# Patient Record
Sex: Female | Born: 1975
Health system: Southern US, Community
[De-identification: ages and names within clinical notes are randomized; demographics above are authoritative.]

## PROBLEM LIST (undated history)

## (undated) DIAGNOSIS — IMO0002 Reserved for concepts with insufficient information to code with codable children: Secondary | ICD-10-CM

## (undated) DIAGNOSIS — M797 Fibromyalgia: Secondary | ICD-10-CM

## (undated) DIAGNOSIS — T8859XA Other complications of anesthesia, initial encounter: Secondary | ICD-10-CM

## (undated) DIAGNOSIS — M549 Dorsalgia, unspecified: Secondary | ICD-10-CM

## (undated) DIAGNOSIS — F4310 Post-traumatic stress disorder, unspecified: Secondary | ICD-10-CM

## (undated) DIAGNOSIS — T4145XA Adverse effect of unspecified anesthetic, initial encounter: Secondary | ICD-10-CM

## (undated) DIAGNOSIS — H7290 Unspecified perforation of tympanic membrane, unspecified ear: Secondary | ICD-10-CM

## (undated) DIAGNOSIS — G2581 Restless legs syndrome: Secondary | ICD-10-CM

## (undated) DIAGNOSIS — G43909 Migraine, unspecified, not intractable, without status migrainosus: Secondary | ICD-10-CM

## (undated) DIAGNOSIS — M1990 Unspecified osteoarthritis, unspecified site: Secondary | ICD-10-CM

## (undated) DIAGNOSIS — K219 Gastro-esophageal reflux disease without esophagitis: Secondary | ICD-10-CM

## (undated) DIAGNOSIS — F419 Anxiety disorder, unspecified: Secondary | ICD-10-CM

## (undated) DIAGNOSIS — F32A Depression, unspecified: Secondary | ICD-10-CM

## (undated) HISTORY — DX: Anxiety disorder, unspecified: F41.9

## (undated) HISTORY — PX: TONSILLECTOMY: SUR1361

## (undated) HISTORY — DX: Reserved for concepts with insufficient information to code with codable children: IMO0002

## (undated) HISTORY — PX: TUBAL LIGATION: SHX77

## (undated) HISTORY — DX: Post-traumatic stress disorder, unspecified: F43.10

## (undated) HISTORY — DX: Depression, unspecified: F32.A

## (undated) HISTORY — DX: Unspecified osteoarthritis, unspecified site: M19.90

---

## 1997-08-10 ENCOUNTER — Ambulatory Visit (HOSPITAL_COMMUNITY): Admission: RE | Admit: 1997-08-10 | Discharge: 1997-08-10 | Payer: Self-pay | Admitting: Dermatology

## 1997-08-26 ENCOUNTER — Inpatient Hospital Stay (HOSPITAL_COMMUNITY): Admission: AD | Admit: 1997-08-26 | Discharge: 1997-08-26 | Payer: Self-pay | Admitting: Obstetrics

## 1997-09-30 ENCOUNTER — Inpatient Hospital Stay (HOSPITAL_COMMUNITY): Admission: AD | Admit: 1997-09-30 | Discharge: 1997-09-30 | Payer: Self-pay | Admitting: Obstetrics

## 1997-10-04 ENCOUNTER — Inpatient Hospital Stay (HOSPITAL_COMMUNITY): Admission: AD | Admit: 1997-10-04 | Discharge: 1997-10-06 | Payer: Self-pay | Admitting: Obstetrics

## 1998-03-05 ENCOUNTER — Inpatient Hospital Stay (HOSPITAL_COMMUNITY): Admission: AD | Admit: 1998-03-05 | Discharge: 1998-03-05 | Payer: Self-pay | Admitting: Obstetrics

## 1998-04-15 ENCOUNTER — Emergency Department (HOSPITAL_COMMUNITY): Admission: EM | Admit: 1998-04-15 | Discharge: 1998-04-15 | Payer: Self-pay | Admitting: Emergency Medicine

## 1999-08-11 ENCOUNTER — Emergency Department (HOSPITAL_COMMUNITY): Admission: EM | Admit: 1999-08-11 | Discharge: 1999-08-11 | Payer: Self-pay | Admitting: Emergency Medicine

## 1999-08-12 ENCOUNTER — Emergency Department (HOSPITAL_COMMUNITY): Admission: EM | Admit: 1999-08-12 | Discharge: 1999-08-12 | Payer: Self-pay

## 1999-08-21 ENCOUNTER — Encounter: Payer: Self-pay | Admitting: Emergency Medicine

## 1999-08-21 ENCOUNTER — Emergency Department (HOSPITAL_COMMUNITY): Admission: EM | Admit: 1999-08-21 | Discharge: 1999-08-21 | Payer: Self-pay | Admitting: Emergency Medicine

## 2000-01-24 ENCOUNTER — Emergency Department (HOSPITAL_COMMUNITY): Admission: EM | Admit: 2000-01-24 | Discharge: 2000-01-24 | Payer: Self-pay | Admitting: Emergency Medicine

## 2001-07-13 ENCOUNTER — Emergency Department (HOSPITAL_COMMUNITY): Admission: EM | Admit: 2001-07-13 | Discharge: 2001-07-13 | Payer: Self-pay | Admitting: Emergency Medicine

## 2001-10-31 ENCOUNTER — Emergency Department (HOSPITAL_COMMUNITY): Admission: EM | Admit: 2001-10-31 | Discharge: 2001-10-31 | Payer: Self-pay | Admitting: Emergency Medicine

## 2001-11-01 ENCOUNTER — Emergency Department (HOSPITAL_COMMUNITY): Admission: EM | Admit: 2001-11-01 | Discharge: 2001-11-01 | Payer: Self-pay | Admitting: Emergency Medicine

## 2003-04-09 ENCOUNTER — Emergency Department (HOSPITAL_COMMUNITY): Admission: EM | Admit: 2003-04-09 | Discharge: 2003-04-09 | Payer: Self-pay | Admitting: Emergency Medicine

## 2003-06-27 ENCOUNTER — Emergency Department (HOSPITAL_COMMUNITY): Admission: EM | Admit: 2003-06-27 | Discharge: 2003-06-27 | Payer: Self-pay | Admitting: Emergency Medicine

## 2004-02-24 ENCOUNTER — Emergency Department (HOSPITAL_COMMUNITY): Admission: EM | Admit: 2004-02-24 | Discharge: 2004-02-24 | Payer: Self-pay | Admitting: Emergency Medicine

## 2004-11-29 ENCOUNTER — Emergency Department (HOSPITAL_COMMUNITY): Admission: EM | Admit: 2004-11-29 | Discharge: 2004-11-29 | Payer: Self-pay | Admitting: Emergency Medicine

## 2005-08-21 ENCOUNTER — Emergency Department (HOSPITAL_COMMUNITY): Admission: EM | Admit: 2005-08-21 | Discharge: 2005-08-21 | Payer: Self-pay | Admitting: Emergency Medicine

## 2006-02-04 ENCOUNTER — Encounter: Payer: Self-pay | Admitting: Emergency Medicine

## 2006-02-05 ENCOUNTER — Inpatient Hospital Stay (HOSPITAL_COMMUNITY): Admission: RE | Admit: 2006-02-05 | Discharge: 2006-02-06 | Payer: Self-pay | Admitting: Orthopaedic Surgery

## 2006-02-23 ENCOUNTER — Inpatient Hospital Stay (HOSPITAL_COMMUNITY): Admission: AD | Admit: 2006-02-23 | Discharge: 2006-02-23 | Payer: Self-pay | Admitting: Obstetrics and Gynecology

## 2006-09-02 ENCOUNTER — Inpatient Hospital Stay (HOSPITAL_COMMUNITY): Admission: AD | Admit: 2006-09-02 | Discharge: 2006-09-02 | Payer: Self-pay | Admitting: Obstetrics & Gynecology

## 2006-11-01 ENCOUNTER — Emergency Department (HOSPITAL_COMMUNITY): Admission: EM | Admit: 2006-11-01 | Discharge: 2006-11-01 | Payer: Self-pay | Admitting: Emergency Medicine

## 2007-03-19 ENCOUNTER — Emergency Department (HOSPITAL_COMMUNITY): Admission: EM | Admit: 2007-03-19 | Discharge: 2007-03-19 | Payer: Self-pay | Admitting: Emergency Medicine

## 2007-08-09 ENCOUNTER — Emergency Department (HOSPITAL_COMMUNITY): Admission: EM | Admit: 2007-08-09 | Discharge: 2007-08-10 | Payer: Self-pay | Admitting: Emergency Medicine

## 2007-10-17 ENCOUNTER — Emergency Department (HOSPITAL_COMMUNITY): Admission: EM | Admit: 2007-10-17 | Discharge: 2007-10-17 | Payer: Self-pay | Admitting: Emergency Medicine

## 2008-01-05 ENCOUNTER — Emergency Department (HOSPITAL_COMMUNITY): Admission: EM | Admit: 2008-01-05 | Discharge: 2008-01-06 | Payer: Self-pay | Admitting: Emergency Medicine

## 2008-01-15 ENCOUNTER — Emergency Department (HOSPITAL_COMMUNITY): Admission: EM | Admit: 2008-01-15 | Discharge: 2008-01-15 | Payer: Self-pay | Admitting: Emergency Medicine

## 2008-02-15 ENCOUNTER — Emergency Department (HOSPITAL_COMMUNITY): Admission: EM | Admit: 2008-02-15 | Discharge: 2008-02-15 | Payer: Self-pay | Admitting: Emergency Medicine

## 2008-03-06 ENCOUNTER — Emergency Department (HOSPITAL_COMMUNITY): Admission: EM | Admit: 2008-03-06 | Discharge: 2008-03-06 | Payer: Self-pay | Admitting: Emergency Medicine

## 2008-03-21 ENCOUNTER — Emergency Department (HOSPITAL_COMMUNITY): Admission: EM | Admit: 2008-03-21 | Discharge: 2008-03-21 | Payer: Self-pay | Admitting: Emergency Medicine

## 2008-03-23 ENCOUNTER — Emergency Department (HOSPITAL_COMMUNITY): Admission: EM | Admit: 2008-03-23 | Discharge: 2008-03-23 | Payer: Self-pay | Admitting: Emergency Medicine

## 2008-04-28 ENCOUNTER — Emergency Department (HOSPITAL_COMMUNITY): Admission: EM | Admit: 2008-04-28 | Discharge: 2008-04-28 | Payer: Self-pay | Admitting: Emergency Medicine

## 2008-09-04 ENCOUNTER — Inpatient Hospital Stay (HOSPITAL_COMMUNITY): Admission: AD | Admit: 2008-09-04 | Discharge: 2008-09-04 | Payer: Self-pay | Admitting: Family Medicine

## 2008-10-25 ENCOUNTER — Emergency Department (HOSPITAL_COMMUNITY): Admission: EM | Admit: 2008-10-25 | Discharge: 2008-10-25 | Payer: Self-pay | Admitting: Emergency Medicine

## 2008-12-04 ENCOUNTER — Emergency Department (HOSPITAL_COMMUNITY): Admission: EM | Admit: 2008-12-04 | Discharge: 2008-12-04 | Payer: Self-pay | Admitting: Emergency Medicine

## 2009-01-21 ENCOUNTER — Emergency Department (HOSPITAL_COMMUNITY): Admission: EM | Admit: 2009-01-21 | Discharge: 2009-01-21 | Payer: Self-pay | Admitting: Emergency Medicine

## 2009-03-09 ENCOUNTER — Emergency Department (HOSPITAL_COMMUNITY): Admission: EM | Admit: 2009-03-09 | Discharge: 2009-03-09 | Payer: Self-pay | Admitting: Emergency Medicine

## 2009-05-07 ENCOUNTER — Emergency Department (HOSPITAL_COMMUNITY): Admission: EM | Admit: 2009-05-07 | Discharge: 2009-05-07 | Payer: Self-pay | Admitting: Emergency Medicine

## 2009-05-12 ENCOUNTER — Emergency Department (HOSPITAL_COMMUNITY): Admission: EM | Admit: 2009-05-12 | Discharge: 2009-05-12 | Payer: Self-pay | Admitting: Emergency Medicine

## 2009-06-23 ENCOUNTER — Emergency Department (HOSPITAL_COMMUNITY): Admission: EM | Admit: 2009-06-23 | Discharge: 2009-06-23 | Payer: Self-pay | Admitting: Emergency Medicine

## 2009-12-04 ENCOUNTER — Emergency Department (HOSPITAL_COMMUNITY): Admission: EM | Admit: 2009-12-04 | Discharge: 2009-12-04 | Payer: Self-pay | Admitting: Emergency Medicine

## 2009-12-08 ENCOUNTER — Emergency Department (HOSPITAL_COMMUNITY): Admission: EM | Admit: 2009-12-08 | Discharge: 2009-12-08 | Payer: Self-pay | Admitting: Emergency Medicine

## 2010-01-31 ENCOUNTER — Emergency Department (HOSPITAL_COMMUNITY): Admission: EM | Admit: 2010-01-31 | Discharge: 2010-01-31 | Payer: Self-pay | Admitting: Emergency Medicine

## 2010-02-04 ENCOUNTER — Ambulatory Visit: Payer: Self-pay | Admitting: Nurse Practitioner

## 2010-02-04 ENCOUNTER — Inpatient Hospital Stay (HOSPITAL_COMMUNITY): Admission: AD | Admit: 2010-02-04 | Discharge: 2010-02-04 | Payer: Self-pay | Admitting: Family Medicine

## 2010-02-18 ENCOUNTER — Emergency Department (HOSPITAL_COMMUNITY): Admission: EM | Admit: 2010-02-18 | Discharge: 2010-02-18 | Payer: Self-pay | Admitting: Emergency Medicine

## 2010-04-11 ENCOUNTER — Emergency Department (HOSPITAL_COMMUNITY): Admission: EM | Admit: 2010-04-11 | Discharge: 2010-04-11 | Payer: Self-pay | Admitting: Emergency Medicine

## 2010-09-04 LAB — POCT I-STAT, CHEM 8
BUN: 14 mg/dL (ref 6–23)
Calcium, Ion: 1.21 mmol/L (ref 1.12–1.32)
HCT: 43 % (ref 36.0–46.0)
Potassium: 4.2 mEq/L (ref 3.5–5.1)
Sodium: 138 mEq/L (ref 135–145)
TCO2: 24 mmol/L (ref 0–100)

## 2010-09-04 LAB — DIFFERENTIAL
Eosinophils Absolute: 0.1 10*3/uL (ref 0.0–0.7)
Eosinophils Relative: 1 % (ref 0–5)
Neutro Abs: 6.5 10*3/uL (ref 1.7–7.7)
Neutrophils Relative %: 57 % (ref 43–77)

## 2010-09-04 LAB — CBC
MCH: 31 pg (ref 26.0–34.0)
Platelets: 347 10*3/uL (ref 150–400)
RDW: 13.1 % (ref 11.5–15.5)
WBC: 11.3 10*3/uL — ABNORMAL HIGH (ref 4.0–10.5)

## 2010-09-04 LAB — POCT CARDIAC MARKERS
CKMB, poc: <1 ng/mL — ABNORMAL LOW (ref 1.0–8.0)
Troponin i, poc: <0.05 ng/mL (ref 0.00–0.09)

## 2010-09-06 LAB — URINALYSIS, ROUTINE W REFLEX MICROSCOPIC
Bilirubin Urine: NEGATIVE
Bilirubin Urine: NEGATIVE
Glucose, UA: NEGATIVE mg/dL
Hgb urine dipstick: NEGATIVE
Ketones, ur: NEGATIVE mg/dL
Nitrite: NEGATIVE
Nitrite: NEGATIVE
Protein, ur: NEGATIVE mg/dL
Urobilinogen, UA: 0.2 mg/dL (ref 0.0–1.0)
Urobilinogen, UA: 0.2 mg/dL (ref 0.0–1.0)
Urobilinogen, UA: 0.2 mg/dL (ref 0.0–1.0)
pH: 5 (ref 5.0–8.0)
pH: 5.5 (ref 5.0–8.0)
pH: 6 (ref 5.0–8.0)

## 2010-09-06 LAB — POCT PREGNANCY, URINE
Preg Test, Ur: NEGATIVE
Preg Test, Ur: NEGATIVE

## 2010-09-06 LAB — GC/CHLAMYDIA PROBE AMP, GENITAL
Chlamydia, DNA Probe: NEGATIVE
GC Probe Amp, Genital: NEGATIVE

## 2010-09-06 LAB — WET PREP, GENITAL
Trich, Wet Prep: NONE SEEN
Yeast Wet Prep HPF POC: NONE SEEN

## 2010-09-06 LAB — URINE MICROSCOPIC-ADD ON

## 2010-09-08 LAB — URINALYSIS, ROUTINE W REFLEX MICROSCOPIC
Bilirubin Urine: NEGATIVE
Protein, ur: NEGATIVE mg/dL
Specific Gravity, Urine: 1.026 (ref 1.005–1.030)
pH: 6 (ref 5.0–8.0)

## 2010-09-08 LAB — URINE MICROSCOPIC-ADD ON

## 2010-09-28 LAB — URINALYSIS, ROUTINE W REFLEX MICROSCOPIC
Bilirubin Urine: NEGATIVE
Hgb urine dipstick: NEGATIVE
Specific Gravity, Urine: 1.02 (ref 1.005–1.030)
pH: 5.5 (ref 5.0–8.0)

## 2010-10-03 LAB — URINALYSIS, ROUTINE W REFLEX MICROSCOPIC
Glucose, UA: NEGATIVE mg/dL
Leukocytes, UA: NEGATIVE
pH: 6 (ref 5.0–8.0)

## 2010-10-03 LAB — URINE MICROSCOPIC-ADD ON

## 2010-10-03 LAB — POCT PREGNANCY, URINE: Preg Test, Ur: NEGATIVE
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
## 2011-03-14 LAB — I-STAT 8, (EC8 V) (CONVERTED LAB)
BUN: 7
Bicarbonate: 24.7 — ABNORMAL HIGH
Glucose, Bld: 97
Sodium: 140
pCO2, Ven: 37.6 — ABNORMAL LOW

## 2011-03-14 LAB — POCT CARDIAC MARKERS
CKMB, poc: 1.2
Myoglobin, poc: 63.5
Troponin i, poc: <0.05

## 2011-03-18 LAB — POCT PREGNANCY, URINE
Operator id: 294591
Preg Test, Ur: NEGATIVE

## 2011-03-26 LAB — POCT URINALYSIS DIP (DEVICE)
Hgb urine dipstick: NEGATIVE
Ketones, ur: NEGATIVE
Protein, ur: NEGATIVE
Specific Gravity, Urine: 1.015
pH: 8.5 — ABNORMAL HIGH

## 2011-03-26 LAB — POCT PREGNANCY, URINE: Preg Test, Ur: NEGATIVE

## 2012-02-11 ENCOUNTER — Emergency Department (HOSPITAL_COMMUNITY)
Admission: EM | Admit: 2012-02-11 | Discharge: 2012-02-12 | Disposition: A | Discharge status: Home / Self Care | Payer: Self-pay | Attending: Emergency Medicine | Admitting: Emergency Medicine

## 2012-02-11 ENCOUNTER — Emergency Department (HOSPITAL_COMMUNITY): Payer: Self-pay

## 2012-02-11 DIAGNOSIS — H53149 Visual discomfort, unspecified: Secondary | ICD-10-CM | POA: Insufficient documentation

## 2012-02-11 DIAGNOSIS — G43909 Migraine, unspecified, not intractable, without status migrainosus: Secondary | ICD-10-CM | POA: Insufficient documentation

## 2012-02-11 DIAGNOSIS — R110 Nausea: Secondary | ICD-10-CM | POA: Insufficient documentation

## 2012-02-11 DIAGNOSIS — R079 Chest pain, unspecified: Secondary | ICD-10-CM | POA: Insufficient documentation

## 2012-02-11 HISTORY — DX: Migraine, unspecified, not intractable, without status migrainosus: G43.909

## 2012-02-11 MED ORDER — SODIUM CHLORIDE 0.9 % IV SOLN
Freq: Once | INTRAVENOUS | Status: AC
  Administered 2012-02-12: 01:00:00 via INTRAVENOUS

## 2012-02-11 NOTE — ED Notes (Signed)
EKG given to EDP, Wentz, MD. 

## 2012-02-11 NOTE — ED Notes (Signed)
Brought in by EMS from home with c/o migraine headache and chest pain.  Per EMS, pt reported that she has been having on and off headache  And "tightening in the chest" for the past several days but tonight, she had "sharp chest pain" while walking to a store.  Pt's chest pain resolved upon EMS arrival.  Presents to room with c/o severe headache, states chest pain "is still a little tight but my head hurts the most".

## 2012-02-11 NOTE — ED Notes (Signed)
ZOX:WR60<AV> Expected date:02/11/12<BR> Expected time:11:15 PM<BR> Means of arrival:Ambulance<BR> Comments:<BR> migraine

## 2012-02-12 ENCOUNTER — Encounter (HOSPITAL_COMMUNITY): Payer: Self-pay | Admitting: Emergency Medicine

## 2012-02-12 LAB — TROPONIN I: Troponin I: <0.3 ng/mL (ref <0.30)

## 2012-02-12 LAB — URINALYSIS, ROUTINE W REFLEX MICROSCOPIC
Glucose, UA: NEGATIVE mg/dL
Ketones, ur: NEGATIVE mg/dL
Nitrite: NEGATIVE
Protein, ur: NEGATIVE mg/dL
pH: 6 (ref 5.0–8.0)

## 2012-02-12 LAB — COMPREHENSIVE METABOLIC PANEL
AST: 20 U/L (ref 0–37)
Albumin: 4.5 g/dL (ref 3.5–5.2)
BUN: 12 mg/dL (ref 6–23)
Calcium: 9.7 mg/dL (ref 8.4–10.5)
Creatinine, Ser: 0.61 mg/dL (ref 0.50–1.10)

## 2012-02-12 LAB — CBC WITH DIFFERENTIAL/PLATELET
Basophils Absolute: 0 10*3/uL (ref 0.0–0.1)
Basophils Relative: 0 % (ref 0–1)
Eosinophils Absolute: 0.2 10*3/uL (ref 0.0–0.7)
Eosinophils Relative: 1 % (ref 0–5)
HCT: 41 % (ref 36.0–46.0)
MCH: 30.9 pg (ref 26.0–34.0)
MCHC: 35.4 g/dL (ref 30.0–36.0)
MCV: 87.4 fL (ref 78.0–100.0)
Monocytes Absolute: 0.9 10*3/uL (ref 0.1–1.0)
Monocytes Relative: 5 % (ref 3–12)
RDW: 13 % (ref 11.5–15.5)

## 2012-02-12 LAB — URINE MICROSCOPIC-ADD ON

## 2012-02-12 LAB — PREGNANCY, URINE: Preg Test, Ur: NEGATIVE

## 2012-02-12 MED ORDER — SODIUM CHLORIDE 0.9 % IV BOLUS (SEPSIS)
1000.0000 mL | Freq: Once | INTRAVENOUS | Status: AC
  Administered 2012-02-12: 1000 mL via INTRAVENOUS

## 2012-02-12 MED ORDER — METOCLOPRAMIDE HCL 5 MG/ML IJ SOLN
10.0000 mg | Freq: Once | INTRAMUSCULAR | Status: AC
  Administered 2012-02-12: 10 mg via INTRAVENOUS
  Filled 2012-02-12: qty 2

## 2012-02-12 MED ORDER — GI COCKTAIL ~~LOC~~
30.0000 mL | Freq: Once | ORAL | Status: AC
  Administered 2012-02-12: 30 mL via ORAL
  Filled 2012-02-12: qty 30

## 2012-02-12 MED ORDER — IBUPROFEN 600 MG PO TABS: 600.0000 mg | ORAL_TABLET | Freq: Four times a day (QID) | ORAL | Status: AC | PRN

## 2012-02-12 MED ORDER — HYDROCODONE-ACETAMINOPHEN 5-325 MG PO TABS: 2.0000 | ORAL_TABLET | Freq: Four times a day (QID) | ORAL | Status: AC | PRN

## 2012-02-12 MED ORDER — KETOROLAC TROMETHAMINE 30 MG/ML IJ SOLN
30.0000 mg | Freq: Once | INTRAMUSCULAR | Status: AC
  Administered 2012-02-12: 30 mg via INTRAVENOUS
  Filled 2012-02-12: qty 1

## 2012-02-12 NOTE — ED Provider Notes (Addendum)
History     CSN: [id number]  Arrival date & time 02/11/12  2319   First MD Initiated Contact with Patient 02/12/12 0011      Chief Complaint  Patient presents with  . Migraine  . Chest Pain    (Consider location/radiation/quality/duration/timing/severity/associated sxs/prior treatment) HPI Comments: Pt comes in with primary complain of migraines. Pt has hx of migraines, and her pain is similar this time, located in the frontal area and occipital area, with associated photophobia and nausea. The pain is throbbing. It typically responds to ice and Advil, hasn't for the past 3 days, thus she comes in for further evaluation. Pt has No seizures, altered mental status, loss of consciousness, new weakness, or numbness, no gait instability.  Pt also complains of some chest discomfort, on the left side for the past several days. The pain is provoked by movement of the upper extremities, specifically raising the arms. No cardiac hx, no illicit use, no hx of PE, DVT or risk factors for the same. The pain is not pleuritic or exertional.   The history is provided by the patient.    No past medical history on file.  No past surgical history on file.  No family history on file.  History  Substance Use Topics  . Smoking status: Not on file  . Smokeless tobacco: Not on file  . Alcohol Use: Not on file    OB History    No data available      Review of Systems  Constitutional: Positive for activity change.  HENT: Negative for neck pain.   Respiratory: Negative for shortness of breath, wheezing and stridor.   Cardiovascular: Positive for chest pain. Negative for palpitations and leg swelling.  Gastrointestinal: Positive for nausea. Negative for vomiting and abdominal pain.  Genitourinary: Negative for dysuria.  Neurological: Positive for headaches. Negative for syncope and weakness.    Allergies  Review of patient's allergies indicates no known allergies.  Home Medications    Current Outpatient Rx  Name Route Sig Dispense Refill  . ASPIRIN EC 325 MG PO TBEC Oral Take 325 mg by mouth daily.    Marland Kitchen NAPROXEN SODIUM 220 MG PO TABS Oral Take 220 mg by mouth 2 (two) times daily with a meal.    . VITAMIN B-12 1000 MCG PO TABS Oral Take 1,000 mcg by mouth daily.      BP 111/93  Pulse 74  Temp 97.9 F (36.6 C) (Oral)  Resp 16  SpO2 96%  LMP 01/23/2012  Physical Exam  Nursing note and vitals reviewed. Constitutional: She is oriented to person, place, and time. She appears well-developed and well-nourished.  HENT:  Head: Normocephalic and atraumatic.  Eyes: EOM are normal. Pupils are equal, round, and reactive to light.  Neck: Neck supple.  Cardiovascular: Normal rate, regular rhythm and normal heart sounds.   No murmur heard. Pulmonary/Chest: Effort normal. No respiratory distress. She exhibits tenderness.       Reproducible chest wall tenderness on the left side with movement  Abdominal: Soft. She exhibits no distension. There is no tenderness. There is no rebound and no guarding.  Neurological: She is alert and oriented to person, place, and time. No cranial nerve deficit. Coordination normal.  Skin: Skin is warm and dry.    ED Course  Procedures (including critical care time)  Labs Reviewed  URINALYSIS, ROUTINE W REFLEX MICROSCOPIC - Abnormal; Notable for the following:    APPearance CLOUDY (*)     Leukocytes, UA SMALL (*)  All other components within normal limits  URINE MICROSCOPIC-ADD ON - Abnormal; Notable for the following:    Squamous Epithelial / LPF FEW (*)     Bacteria, UA FEW (*)     All other components within normal limits  PREGNANCY, URINE  CBC WITH DIFFERENTIAL  COMPREHENSIVE METABOLIC PANEL  TROPONIN I   Dg Chest 2 View  02/12/2012  *RADIOLOGY REPORT*  Clinical Data: Chest pain.  CHEST - 2 VIEW  Comparison: 04/11/2010  Findings: The heart size and pulmonary vascularity are normal. The lungs appear clear and expanded without  focal air space disease or consolidation. No blunting of the costophrenic angles.  No pneumothorax.  Mediastinal contours appear intact.  No significant change since previous study.  IMPRESSION: No evidence of active pulmonary disease.   Original Report Authenticated By: Marlon Pel, M.D.      No diagnosis found.    MDM   Date: 02/12/2012  Rate: 74  Rhythm: normal sinus rhythm  QRS Axis: normal  Intervals: normal  ST/T Wave abnormalities: normal  Conduction Disutrbances:none  Narrative Interpretation:   Old EKG Reviewed: none available    DDX includes: Primary headaches - including migrainous headaches, cluster headaches, tension headaches. ICH Carotid dissection Cavernous sinus thrombosis Meningitis Encephalitis Sinusitis Tumor Vascular headaches AV malformation Brain aneurysm Muscular headaches ACS syndrome Dyspepsia Pneumonia PE  A/P: Pt comes in with cc of headaches. No concerns for life threatening secondary headaches because the headches are typical of her migraine headaches. Will give pain meds, for now. The chest pain is not new, and is musculoskeletal in nature on our exam. trop x 1, ekg and CXR is the only workup we will get.   Derwood Kaplan, MD 02/12/12 0040  Derwood Kaplan, MD 02/12/12 1610

## 2012-02-12 NOTE — Discharge Instructions (Signed)
We saw you in the ER for headaches. All the labs and imaging are normal. We are not sure what is causing your headaches, however, there appears to be no evidence of infection, bleeds or tumors based on our exam and results. LIKELY MIGRANOUS HEADACHES.  Please take motrin round the clock for the next 6 hours, and take other meds prescribed only for break through pain. See your doctor if the pain persists, as you might need better medications or a specialist.   RESOURCE GUIDE  Chronic Pain Problems: Contact [facility]  [phone] Patients need to be referred by their primary care doctor.  Insufficient Money for Medicine: Contact United Way:  call "211" or [facility] 9781382737.  No Primary Care Doctor: - Call [facility]  [phone] - can help you locate a primary care doctor that  accepts your insurance, provides certain services, etc. - Physician Referral Service- [phone]  Agencies that provide inexpensive medical care: - [facility]  [phone] - [facility]  [phone] - [facility]  [phone] - Women's Clinic  [phone] - Planned Parenthood  [phone] [facility]  [phone]  Medicaid-accepting [facility] Providers: - [facility]- [address]  [phone], Mon-Fri 9am-7pm, Sat 9am-1pm - [facility]- [address] [place], Suite [place]  [phone] - [facility]- [address], [address] [place]  [phone] [facility]- [address]  [phone] - Renaye Rakers- [address] [place], [address], [phone]  Only accepts [place] patients after they have their name  applied to their card  Self Pay (no insurance) in [place]: - Sickle Cell Patients: Dr Willey Blade, [facility]  [address] [place], [phone] - [facility]- [address] [place]  [phone]        [facility] [place]- [address]       -     [facility]- see information above (Speak to [employer] if you do not have insurance)       -  [facility]- [address] [place], [phone]       -  [facility]- [address],  [phone]       -  [facility]- [address] [phone]       -  Dr Julio Sicks-  [address], [place], [phone]       -  [facility]- [address], [phone]       -  [facility]- [address] [phone], also [address] [phone]       -    [facility]- [address] [place], [phone], 1st & 3rd Saturday   every month, 10am-1pm  1) Find a Doctor and Pay Out of Pocket Although you won't have to find out who is covered by your insurance plan, it is a good idea to ask around and get recommendations. You will then need to call the office and see if the doctor you have chosen will accept you as a new patient and what types of options they offer for patients who are self-pay. Some doctors offer discounts or will set up payment plans for their patients who do not have insurance, but you  will need to ask so you aren't surprised when you get to your appointment.  2) Contact Your Local Health Department Not all health departments have doctors that can see patients for sick visits, but many do, so it is worth a call to see if yours does. If you don't know where your local health department is, you can check in your phone book. The CDC also has a tool to help you locate your state's health department, and many state websites also have listings of all of their local health departments.  3) Find a Walk-in Clinic If your illness is not likely to be very severe or complicated, you may want to try a walk in clinic. These are popping up all over the country in pharmacies, drugstores, and shopping centers. They're usually staffed by nurse practitioners or  physician assistants that have been trained to treat common illnesses and complaints. They're usually fairly quick and inexpensive. However, if you have serious medical issues or chronic medical problems, these are probably not your best option  STD Testing - [facility] Department of [facility], STD Clinic, [address] [place], phone [phone] or [phone].  Monday - Friday, call for an appointment. [facility] Department of [employer], STD Clinic, [address], [place], phone [phone] or [phone].  Monday - Friday, call for an appointment.  Abuse/Neglect: [facility] Child Abuse Hotline 9198745653 [facility] Child Abuse Hotline 613-245-1109 (After Hours)  Emergency Shelter:  [facility] [phone]  Maternity Homes: - [facility] [place] [facility] ([phone] - [facility] 2054564622  MRSA Hotline #:   385-021-8727  [facility] Resources  [facility]  United Way [facility] Dept. [address]                 [address]         [address] [place] [address]  Blondell Reveal Phone:  [phone]                                  Phone:  [phone]                   Phone:  [phone]  [facility], [phone] - [phone]       -     [facility] in [place], [address],                                  [phone], [facility] Child Abuse Hotline (810)325-9260 or 9311065522 (After Hours)   Behavioral Health Services  Substance Abuse Resources: - Alcohol and Drug Services  [phone] - [facility] [phone] - [facility] [phone] Floydene Flock [phone] - Residential & Outpatient  Substance Abuse Program  [phone]  Psychological Services: [facility]  [phone] Services  [phone] - [facility], [phone] [place]. [address] [place], ACCESS LINE: [phone] or 3611838282, [employer]  Dental Assistance  If unable to pay or uninsured, contact:  [facility] or [facility]. to become qualified for the adult dental clinic.  Patients with Medicaid: [facility] [phone] [address]n Quails, [phone] [address] [phone]  If unable to pay, or uninsured, contact [facility] [phone]) or [facility] Department [phone] in [place], [phone] in [facility]) to become qualified for the adult dental clinic  Other Low-Cost Community Dental Services: - [facility]- [address] [place], [place], [place], [address], [phone], Ext. 123, 2nd and 4th Thursday of the month at 6:30am.  10 clients each day by appointment, can sometimes see walk-in patients if someone does not show for an appointment. [facility]- [address] [place], [place], [address], [phone] - [facility]- [address] [place], [place], [address], [phone] [facility]- [phone] [facility] Department- [phone] [facility] [phone]        Migraine Headache A migraine headache is an intense, throbbing pain on one or both sides of your head. The exact cause of a migraine headache is not always known. A migraine may be caused when nerves in the brain become irritated and release chemicals that cause swelling within blood vessels, causing pain. Many migraine sufferers have a family history of migraines. Before you get a migraine you may or may not get an aura. An aura is a group of symptoms that can predict the beginning of a migraine. An aura may include:  Visual changes such  as:   Flashing lights.   Bright spots or zig-zag lines.   Tunnel vision.   Feelings of numbness.   Trouble talking.   Muscle weakness.  SYMPTOMS  Pain on one or both sides of your head.   Pain that is pulsating or throbbing in nature.   Pain that is severe enough to prevent daily activities.   Pain that is aggravated by any daily physical activity.   Nausea (feeling sick to your stomach), vomiting, or both.   Pain with exposure to bright lights, loud noises, or activity.   General sensitivity to bright lights or loud noises.  MIGRAINE TRIGGERS Examples of triggers of migraine headaches include:   Alcohol.   Smoking.   Stress.   It may be related to menses (female menstruation).   Aged cheeses.   Foods or drinks that contain nitrates, glutamate, aspartame, or tyramine.   Lack of sleep.   Chocolate.   Caffeine.   Hunger.   Medications such as nitroglycerine (used to treat chest pain), birth control pills, estrogen, and some blood pressure medications.  DIAGNOSIS  A migraine headache is often diagnosed based on:  Symptoms.   Physical examination.   A computerized X-ray scan (computed tomography, CT) of your head.  TREATMENT  Medications can help prevent migraines if they are recurrent or should they become recurrent. Your caregiver can help you with a medication or treatment program that will be helpful to you.   Lying down in a dark, quiet room may be helpful.   Keeping a headache diary may help you find a trend as to what may be triggering your headaches.  SEEK IMMEDIATE MEDICAL CARE IF:   You have confusion, personality  changes or seizures.   You have headaches that wake you from sleep.   You have an increased frequency in your headaches.   You have a stiff neck.   You have a loss of vision.   You have muscle weakness.   You start losing your balance or have trouble walking.   You feel faint or pass out.  MAKE SURE YOU:    Understand these instructions.   Will watch your condition.   Will get help right away if you are not doing well or get worse.  Document Released: 06/09/2005 Document Revised: 05/29/2011 Document Reviewed: 01/23/2009 [facility] Patient Information 2012 [place], [place].

## 2012-04-20 ENCOUNTER — Encounter (HOSPITAL_COMMUNITY): Payer: Self-pay | Admitting: *Deleted

## 2012-04-20 ENCOUNTER — Inpatient Hospital Stay (HOSPITAL_COMMUNITY)
Admission: AD | Admit: 2012-04-20 | Discharge: 2012-04-20 | Disposition: A | Discharge status: Home / Self Care | Payer: Self-pay | Source: Ambulatory Visit | Attending: Obstetrics & Gynecology | Admitting: Obstetrics & Gynecology

## 2012-04-20 DIAGNOSIS — N760 Acute vaginitis: Secondary | ICD-10-CM | POA: Insufficient documentation

## 2012-04-20 DIAGNOSIS — A499 Bacterial infection, unspecified: Secondary | ICD-10-CM | POA: Insufficient documentation

## 2012-04-20 DIAGNOSIS — N949 Unspecified condition associated with female genital organs and menstrual cycle: Secondary | ICD-10-CM | POA: Insufficient documentation

## 2012-04-20 DIAGNOSIS — B9689 Other specified bacterial agents as the cause of diseases classified elsewhere: Secondary | ICD-10-CM | POA: Insufficient documentation

## 2012-04-20 HISTORY — DX: Adverse effect of unspecified anesthetic, initial encounter: T41.45XA

## 2012-04-20 HISTORY — DX: Other complications of anesthesia, initial encounter: T88.59XA

## 2012-04-20 LAB — URINALYSIS, ROUTINE W REFLEX MICROSCOPIC
Bilirubin Urine: NEGATIVE
Glucose, UA: NEGATIVE mg/dL
Hgb urine dipstick: NEGATIVE
Protein, ur: NEGATIVE mg/dL
Urobilinogen, UA: 0.2 mg/dL (ref 0.0–1.0)

## 2012-04-20 LAB — WET PREP, GENITAL: Trich, Wet Prep: NONE SEEN

## 2012-04-20 MED ORDER — METRONIDAZOLE 500 MG PO TABS: 500.0000 mg | ORAL_TABLET | Freq: Two times a day (BID) | ORAL | Status: DC

## 2012-04-20 NOTE — MAU Note (Signed)
Pt states she has had vaginal discharge for about a month-took an over the counter for vaginaosis about a month ago and it is now worse

## 2012-04-20 NOTE — MAU Provider Note (Signed)
History     CSN: [id number]  Arrival date and time: 04/20/12 1813   First Provider Initiated Contact with Patient 04/20/12 2041      Chief Complaint  Patient presents with  . Vaginal Discharge   HPI Alicia Parks is a 36 y.o. female who presents to MAU with vaginal discharge. The discharge started 2 months ago. She describes the discharge as yellow with a bad smell. Has to use a pad. Last pap smear less than one year and [facility].  Current sex partner x 5 years. BTL for birth control. Hx of Chlamydia in 1996. The history was provided by the patient.  OB History    Grav Para Term Preterm Abortions TAB SAB Ect Mult Living   3 3 3  0 0 0 0 0 0 3      Past Medical History  Diagnosis Date  . Migraine   . Complication of anesthesia     Past Surgical History  Procedure Date  . Tubal ligation   . Tonsillectomy     Family History  Problem Relation Age of Onset  . Hypertension Father   . Heart disease Father     History  Substance Use Topics  . Smoking status: Never Smoker   . Smokeless tobacco: Not on file  . Alcohol Use: No    Allergies: No Known Allergies  Prescriptions prior to admission  Medication Sig Dispense Refill  . aspirin EC 325 MG tablet Take 325 mg by mouth daily.      . naproxen sodium (ANAPROX) 220 MG tablet Take 220 mg by mouth 2 (two) times daily with a meal.      . vitamin B-12 (CYANOCOBALAMIN) 1000 MCG tablet Take 1,000 mcg by mouth daily.        Review of Systems  Constitutional: Negative for fever, chills and weight loss.  HENT: Negative for ear pain, nosebleeds, congestion, sore throat and neck pain.   Eyes: Negative for blurred vision, double vision, photophobia and pain.  Respiratory: Negative for cough, shortness of breath and wheezing.   Cardiovascular: Negative for chest pain, palpitations and leg swelling.  Gastrointestinal: Negative for heartburn, nausea, vomiting, abdominal pain, diarrhea and constipation.    Genitourinary: Negative for dysuria, urgency and frequency.       Vaginal discharge  Musculoskeletal: Negative for myalgias and back pain.  Skin: Negative for itching and rash.  Neurological: Positive for headaches. Negative for dizziness, sensory change, speech change, seizures and weakness.  Endo/Heme/Allergies: Does not bruise/bleed easily.  Psychiatric/Behavioral: Positive for depression (bipolar). The patient does not have insomnia.    Physical Exam   Blood pressure 124/74, pulse 69, temperature 98 F (36.7 C), resp. rate 20, height 5\' 4"  (1.626 m), weight 182 lb 6 oz (82.725 kg), last menstrual period 03/23/2012, SpO2 100.00%.  Physical Exam  Nursing note and vitals reviewed. Constitutional: She is oriented to person, place, and time. She appears well-developed and well-nourished. No distress.  HENT:  Head: Normocephalic and atraumatic.  Eyes: EOM are normal.  Neck: Neck supple.  Cardiovascular: Normal rate.   Respiratory: Effort normal.  GI: Soft. There is no tenderness.  Genitourinary:       External genitalia without lesions. Cervix with Nabothian cyst. No CMT. Watery yellow discharge vaginal vault with odor. Uterus without palpable enlargement.  Musculoskeletal: Normal range of motion.  Neurological: She is alert and oriented to person, place, and time.  Skin: Skin is warm and dry.  Psychiatric: She has a normal  mood and affect. Her behavior is normal. Judgment and thought content normal.   Procedures Assessment: 36 y.o. female with vaginal discharge   Bacterial vaginosis  Plan:  Rx Flagyl   Follow up with Women's Health for regular exam  NEESE,HOPE, RN, FNP, [facility] 04/20/2012, 8:42 PM

## 2012-04-20 NOTE — MAU Note (Signed)
Pt state she has had discharge for about 2 months and tried to treat bacterial vaginosis, counter medication from the drug store.

## 2012-04-21 LAB — GC/CHLAMYDIA PROBE AMP, GENITAL
Chlamydia, DNA Probe: NEGATIVE
GC Probe Amp, Genital: NEGATIVE

## 2012-12-20 ENCOUNTER — Emergency Department (HOSPITAL_COMMUNITY)
Admission: EM | Admit: 2012-12-20 | Discharge: 2012-12-20 | Disposition: A | Discharge status: Home / Self Care | Payer: Self-pay | Attending: Emergency Medicine | Admitting: Emergency Medicine

## 2012-12-20 ENCOUNTER — Encounter (HOSPITAL_COMMUNITY): Payer: Self-pay | Admitting: Emergency Medicine

## 2012-12-20 DIAGNOSIS — S0920XA Traumatic rupture of unspecified ear drum, initial encounter: Secondary | ICD-10-CM | POA: Insufficient documentation

## 2012-12-20 DIAGNOSIS — Z8679 Personal history of other diseases of the circulatory system: Secondary | ICD-10-CM | POA: Insufficient documentation

## 2012-12-20 DIAGNOSIS — IMO0002 Reserved for concepts with insufficient information to code with codable children: Secondary | ICD-10-CM | POA: Insufficient documentation

## 2012-12-20 DIAGNOSIS — Y929 Unspecified place or not applicable: Secondary | ICD-10-CM | POA: Insufficient documentation

## 2012-12-20 DIAGNOSIS — Z79899 Other long term (current) drug therapy: Secondary | ICD-10-CM | POA: Insufficient documentation

## 2012-12-20 DIAGNOSIS — H7292 Unspecified perforation of tympanic membrane, left ear: Secondary | ICD-10-CM

## 2012-12-20 DIAGNOSIS — Y939 Activity, unspecified: Secondary | ICD-10-CM | POA: Insufficient documentation

## 2012-12-20 HISTORY — DX: Unspecified perforation of tympanic membrane, unspecified ear: H72.90

## 2012-12-20 MED ORDER — HYDROCODONE-ACETAMINOPHEN 5-325 MG PO TABS: 2.0000 | ORAL_TABLET | Freq: Four times a day (QID) | ORAL | Status: DC | PRN
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
## 2012-12-21 NOTE — ED Provider Notes (Signed)
Medical screening examination/treatment/procedure(s) were performed by non-physician practitioner and as supervising physician I was immediately available for consultation/collaboration.  Raeford Razor, MD 12/21/12 [phone]

## 2013-01-05 ENCOUNTER — Emergency Department (HOSPITAL_COMMUNITY)
Admission: EM | Admit: 2013-01-05 | Discharge: 2013-01-05 | Disposition: A | Discharge status: Home / Self Care | Payer: Self-pay | Attending: Emergency Medicine | Admitting: Emergency Medicine

## 2013-01-05 ENCOUNTER — Encounter (HOSPITAL_COMMUNITY): Payer: Self-pay

## 2013-01-05 DIAGNOSIS — H9202 Otalgia, left ear: Secondary | ICD-10-CM

## 2013-01-05 DIAGNOSIS — R42 Dizziness and giddiness: Secondary | ICD-10-CM | POA: Insufficient documentation

## 2013-01-05 DIAGNOSIS — H7290 Unspecified perforation of tympanic membrane, unspecified ear: Secondary | ICD-10-CM | POA: Insufficient documentation

## 2013-01-05 DIAGNOSIS — H9209 Otalgia, unspecified ear: Secondary | ICD-10-CM | POA: Insufficient documentation

## 2013-01-05 DIAGNOSIS — J3489 Other specified disorders of nose and nasal sinuses: Secondary | ICD-10-CM | POA: Insufficient documentation

## 2013-01-05 DIAGNOSIS — Z8679 Personal history of other diseases of the circulatory system: Secondary | ICD-10-CM | POA: Insufficient documentation

## 2013-01-05 MED ORDER — LORATADINE 10 MG PO TABS: 10.0000 mg | ORAL_TABLET | Freq: Every day | ORAL | Status: DC

## 2013-01-05 MED ORDER — HYDROCODONE-ACETAMINOPHEN 5-325 MG PO TABS: 1.0000 | ORAL_TABLET | ORAL | Status: DC | PRN

## 2013-01-05 MED ORDER — AMOXICILLIN 500 MG PO CAPS: 1000.0000 mg | ORAL_CAPSULE | Freq: Two times a day (BID) | ORAL | Status: DC

## 2013-01-05 NOTE — Progress Notes (Signed)
[facility] has seen patient. Patient stated that she is trying to get an appt with the doctor at [facility] the [place]. Provided her with a [employer] application and told her that [employer] would help her get enrolled and get her set up with a pcp.
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
## 2013-01-05 NOTE — ED Provider Notes (Signed)
Medical screening examination/treatment/procedure(s) were performed by non-physician practitioner and as supervising physician I was immediately available for consultation/collaboration.   Joya Gaskins, MD 01/05/13 1455

## 2013-01-17 ENCOUNTER — Emergency Department (HOSPITAL_COMMUNITY): Payer: Self-pay

## 2013-01-17 ENCOUNTER — Encounter (HOSPITAL_COMMUNITY): Payer: Self-pay | Admitting: Cardiology

## 2013-01-17 ENCOUNTER — Emergency Department (HOSPITAL_COMMUNITY)
Admission: EM | Admit: 2013-01-17 | Discharge: 2013-01-17 | Disposition: A | Discharge status: Home / Self Care | Payer: Self-pay | Attending: Emergency Medicine | Admitting: Emergency Medicine

## 2013-01-17 DIAGNOSIS — M549 Dorsalgia, unspecified: Secondary | ICD-10-CM

## 2013-01-17 DIAGNOSIS — M545 Low back pain, unspecified: Secondary | ICD-10-CM | POA: Insufficient documentation

## 2013-01-17 DIAGNOSIS — T7492XA Unspecified child maltreatment, confirmed, initial encounter: Secondary | ICD-10-CM | POA: Insufficient documentation

## 2013-01-17 DIAGNOSIS — M542 Cervicalgia: Secondary | ICD-10-CM | POA: Insufficient documentation

## 2013-01-17 DIAGNOSIS — M62838 Other muscle spasm: Secondary | ICD-10-CM | POA: Insufficient documentation

## 2013-01-17 DIAGNOSIS — Z8669 Personal history of other diseases of the nervous system and sense organs: Secondary | ICD-10-CM | POA: Insufficient documentation

## 2013-01-17 DIAGNOSIS — Z888 Allergy status to other drugs, medicaments and biological substances status: Secondary | ICD-10-CM | POA: Insufficient documentation

## 2013-01-17 DIAGNOSIS — S2232XA Fracture of one rib, left side, initial encounter for closed fracture: Secondary | ICD-10-CM

## 2013-01-17 DIAGNOSIS — IMO0002 Reserved for concepts with insufficient information to code with codable children: Secondary | ICD-10-CM | POA: Insufficient documentation

## 2013-01-17 DIAGNOSIS — R209 Unspecified disturbances of skin sensation: Secondary | ICD-10-CM | POA: Insufficient documentation

## 2013-01-17 DIAGNOSIS — Z79899 Other long term (current) drug therapy: Secondary | ICD-10-CM | POA: Insufficient documentation

## 2013-01-17 DIAGNOSIS — S2239XA Fracture of one rib, unspecified side, initial encounter for closed fracture: Secondary | ICD-10-CM | POA: Insufficient documentation

## 2013-01-17 DIAGNOSIS — T7491XA Unspecified adult maltreatment, confirmed, initial encounter: Secondary | ICD-10-CM | POA: Insufficient documentation

## 2013-01-17 MED ORDER — OXYCODONE-ACETAMINOPHEN 5-325 MG PO TABS: 1.0000 | ORAL_TABLET | ORAL | Status: DC | PRN

## 2013-01-17 MED ORDER — PREDNISONE 20 MG PO TABS: 40.0000 mg | ORAL_TABLET | Freq: Every day | ORAL | Status: DC

## 2013-01-17 NOTE — ED Provider Notes (Signed)
Medical screening examination/treatment/procedure(s) were performed by non-physician practitioner and as supervising physician I was immediately available for consultation/collaboration.   Gwyneth Sprout, MD 01/17/13 2203

## 2013-01-17 NOTE — ED Notes (Signed)
Pt returns from xray
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
## 2013-03-22 ENCOUNTER — Emergency Department (HOSPITAL_COMMUNITY): Payer: Self-pay

## 2013-03-22 ENCOUNTER — Encounter (HOSPITAL_COMMUNITY): Payer: Self-pay | Admitting: Emergency Medicine

## 2013-03-22 ENCOUNTER — Emergency Department (HOSPITAL_COMMUNITY)
Admission: EM | Admit: 2013-03-22 | Discharge: 2013-03-22 | Disposition: A | Discharge status: Home / Self Care | Payer: Self-pay | Attending: Emergency Medicine | Admitting: Emergency Medicine

## 2013-03-22 DIAGNOSIS — G43909 Migraine, unspecified, not intractable, without status migrainosus: Secondary | ICD-10-CM | POA: Insufficient documentation

## 2013-03-22 DIAGNOSIS — S298XXA Other specified injuries of thorax, initial encounter: Secondary | ICD-10-CM | POA: Insufficient documentation

## 2013-03-22 DIAGNOSIS — S20212A Contusion of left front wall of thorax, initial encounter: Secondary | ICD-10-CM

## 2013-03-22 DIAGNOSIS — Z79899 Other long term (current) drug therapy: Secondary | ICD-10-CM | POA: Insufficient documentation

## 2013-03-22 DIAGNOSIS — S2341XA Sprain of ribs, initial encounter: Secondary | ICD-10-CM

## 2013-03-22 DIAGNOSIS — R42 Dizziness and giddiness: Secondary | ICD-10-CM | POA: Insufficient documentation

## 2013-03-22 DIAGNOSIS — IMO0002 Reserved for concepts with insufficient information to code with codable children: Secondary | ICD-10-CM | POA: Insufficient documentation

## 2013-03-22 DIAGNOSIS — S20219A Contusion of unspecified front wall of thorax, initial encounter: Secondary | ICD-10-CM | POA: Insufficient documentation

## 2013-03-22 DIAGNOSIS — Z8669 Personal history of other diseases of the nervous system and sense organs: Secondary | ICD-10-CM | POA: Insufficient documentation

## 2013-03-22 DIAGNOSIS — Z791 Long term (current) use of non-steroidal anti-inflammatories (NSAID): Secondary | ICD-10-CM | POA: Insufficient documentation

## 2013-03-22 MED ORDER — HYDROCODONE-ACETAMINOPHEN 5-325 MG PO TABS: 1.0000 | ORAL_TABLET | Freq: Four times a day (QID) | ORAL | Status: DC | PRN

## 2013-03-22 MED ORDER — IBUPROFEN 800 MG PO TABS: 800.0000 mg | ORAL_TABLET | Freq: Three times a day (TID) | ORAL | Status: DC | PRN
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
## 2013-03-22 NOTE — ED Notes (Signed)
Patient transported to X-ray 

## 2013-03-23 NOTE — ED Provider Notes (Signed)
  Medical screening examination/treatment/procedure(s) were performed by non-physician practitioner and as supervising physician I was immediately available for consultation/collaboration.    Lorris Carducci, MD 03/23/13 0008 

## 2013-03-31 ENCOUNTER — Emergency Department (HOSPITAL_COMMUNITY): Payer: Self-pay

## 2013-03-31 ENCOUNTER — Emergency Department (HOSPITAL_COMMUNITY)
Admission: EM | Admit: 2013-03-31 | Discharge: 2013-03-31 | Disposition: A | Discharge status: Home / Self Care | Payer: Self-pay | Attending: Emergency Medicine | Admitting: Emergency Medicine

## 2013-03-31 ENCOUNTER — Encounter (HOSPITAL_COMMUNITY): Payer: Self-pay | Admitting: Emergency Medicine

## 2013-03-31 DIAGNOSIS — Z79899 Other long term (current) drug therapy: Secondary | ICD-10-CM | POA: Insufficient documentation

## 2013-03-31 DIAGNOSIS — M791 Myalgia, unspecified site: Secondary | ICD-10-CM

## 2013-03-31 DIAGNOSIS — R51 Headache: Secondary | ICD-10-CM | POA: Insufficient documentation

## 2013-03-31 DIAGNOSIS — M25519 Pain in unspecified shoulder: Secondary | ICD-10-CM | POA: Insufficient documentation

## 2013-03-31 DIAGNOSIS — Z8669 Personal history of other diseases of the nervous system and sense organs: Secondary | ICD-10-CM | POA: Insufficient documentation

## 2013-03-31 DIAGNOSIS — M542 Cervicalgia: Secondary | ICD-10-CM | POA: Insufficient documentation

## 2013-03-31 DIAGNOSIS — M546 Pain in thoracic spine: Secondary | ICD-10-CM | POA: Insufficient documentation

## 2013-03-31 DIAGNOSIS — M25511 Pain in right shoulder: Secondary | ICD-10-CM

## 2013-03-31 DIAGNOSIS — IMO0001 Reserved for inherently not codable concepts without codable children: Secondary | ICD-10-CM | POA: Insufficient documentation

## 2013-03-31 HISTORY — DX: Dorsalgia, unspecified: M54.9

## 2013-03-31 MED ORDER — KETOROLAC TROMETHAMINE 60 MG/2ML IM SOLN
60.0000 mg | Freq: Once | INTRAMUSCULAR | Status: AC
  Administered 2013-03-31: 60 mg via INTRAMUSCULAR
  Filled 2013-03-31: qty 2

## 2013-03-31 MED ORDER — METHOCARBAMOL 500 MG PO TABS: 500.0000 mg | ORAL_TABLET | Freq: Two times a day (BID) | ORAL | Status: DC

## 2013-03-31 MED ORDER — IOHEXOL 300 MG/ML  SOLN
100.0000 mL | Freq: Once | INTRAMUSCULAR | Status: AC | PRN
  Administered 2013-03-31: 100 mL via INTRAVENOUS

## 2013-03-31 MED ORDER — NAPROXEN 500 MG PO TABS: 500.0000 mg | ORAL_TABLET | Freq: Two times a day (BID) | ORAL | Status: DC

## 2013-03-31 MED ORDER — DIAZEPAM 5 MG/ML IJ SOLN
10.0000 mg | Freq: Once | INTRAMUSCULAR | Status: AC
  Administered 2013-03-31: 5 mg via INTRAMUSCULAR
  Filled 2013-03-31: qty 2

## 2013-03-31 MED ORDER — OXYCODONE-ACETAMINOPHEN 5-325 MG PO TABS: 1.0000 | ORAL_TABLET | Freq: Once | ORAL | Status: DC

## 2013-03-31 MED ORDER — OXYCODONE-ACETAMINOPHEN 5-325 MG PO TABS: 1.0000 | ORAL_TABLET | Freq: Three times a day (TID) | ORAL | Status: DC | PRN
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
## 2013-03-31 NOTE — Progress Notes (Signed)
[facility] CL spoke with patient about [facility] [employer]. Patient stated that she had an apt today at [facility] the [place] for enrollment with the [employer].

## 2013-03-31 NOTE — ED Notes (Signed)
Pt stated that she woke up with r/neck and shoulder pain yesterday. Denies trauma

## 2013-04-01 NOTE — ED Provider Notes (Signed)
Medical screening examination/treatment/procedure(s) were performed by non-physician practitioner and as supervising physician I was immediately available for consultation/collaboration.  Shon Baton, MD 04/01/13 1901

## 2013-10-17 ENCOUNTER — Emergency Department (HOSPITAL_COMMUNITY): Payer: Self-pay

## 2013-10-17 ENCOUNTER — Emergency Department (HOSPITAL_COMMUNITY)
Admission: EM | Admit: 2013-10-17 | Discharge: 2013-10-17 | Disposition: A | Discharge status: Home / Self Care | Payer: Self-pay | Attending: Emergency Medicine | Admitting: Emergency Medicine

## 2013-10-17 ENCOUNTER — Encounter (HOSPITAL_COMMUNITY): Payer: Self-pay | Admitting: Emergency Medicine

## 2013-10-17 DIAGNOSIS — G43909 Migraine, unspecified, not intractable, without status migrainosus: Secondary | ICD-10-CM | POA: Insufficient documentation

## 2013-10-17 DIAGNOSIS — W07XXXA Fall from chair, initial encounter: Secondary | ICD-10-CM | POA: Insufficient documentation

## 2013-10-17 DIAGNOSIS — IMO0002 Reserved for concepts with insufficient information to code with codable children: Secondary | ICD-10-CM | POA: Insufficient documentation

## 2013-10-17 DIAGNOSIS — Z79899 Other long term (current) drug therapy: Secondary | ICD-10-CM | POA: Insufficient documentation

## 2013-10-17 DIAGNOSIS — Y929 Unspecified place or not applicable: Secondary | ICD-10-CM | POA: Insufficient documentation

## 2013-10-17 DIAGNOSIS — Z791 Long term (current) use of non-steroidal anti-inflammatories (NSAID): Secondary | ICD-10-CM | POA: Insufficient documentation

## 2013-10-17 DIAGNOSIS — M549 Dorsalgia, unspecified: Secondary | ICD-10-CM

## 2013-10-17 DIAGNOSIS — Y9339 Activity, other involving climbing, rappelling and jumping off: Secondary | ICD-10-CM | POA: Insufficient documentation

## 2013-10-17 DIAGNOSIS — G8929 Other chronic pain: Secondary | ICD-10-CM | POA: Insufficient documentation

## 2013-10-17 MED ORDER — METHOCARBAMOL 500 MG PO TABS: 500.0000 mg | ORAL_TABLET | Freq: Two times a day (BID) | ORAL | Status: DC

## 2013-10-17 NOTE — Discharge Instructions (Signed)
No driving or operating heavy machinery while taking robaxin; this medication may make you drowsy. Rest, avoid heavy lifting or hard physical activity. Continue ibuprofen.  Back Pain, Adult Low back pain is very common. About 1 in 5 people have back pain.The cause of low back pain is rarely dangerous. The pain often gets better over time.About half of people with a sudden onset of back pain feel better in just 2 weeks. About 8 in 10 people feel better by 6 weeks.  CAUSES Some common causes of back pain include:  Strain of the muscles or ligaments supporting the spine.  Wear and tear (degeneration) of the spinal discs.  Arthritis.  Direct injury to the back. DIAGNOSIS Most of the time, the direct cause of low back pain is not known.However, back pain can be treated effectively even when the exact cause of the pain is unknown.Answering your caregiver's questions about your overall health and symptoms is one of the most accurate ways to make sure the cause of your pain is not dangerous. If your caregiver needs more information, he or she may order lab work or imaging tests (X-rays or MRIs).However, even if imaging tests show changes in your back, this usually does not require surgery. HOME CARE INSTRUCTIONS For many people, back pain returns.Since low back pain is rarely dangerous, it is often a condition that people can learn to [facility]on their own.   Remain active. It is stressful on the back to sit or stand in one place. Do not sit, drive, or stand in one place for more than 30 minutes at a time. Take short walks on level surfaces as soon as pain allows.Try to increase the length of time you walk each day.  Do not stay in bed.Resting more than 1 or 2 days can delay your recovery.  Do not avoid exercise or work.Your body is made to move.It is not dangerous to be active, even though your back may hurt.Your back will likely heal faster if you return to being active before your pain  is gone.  Pay attention to your body when you bend and lift. Many people have less discomfortwhen lifting if they bend their knees, keep the load close to their bodies,and avoid twisting. Often, the most comfortable positions are those that put less stress on your recovering back.  Find a comfortable position to sleep. Use a firm mattress and lie on your side with your knees slightly bent. If you lie on your back, put a pillow under your knees.  Only take over-the-counter or prescription medicines as directed by your caregiver. Over-the-counter medicines to reduce pain and inflammation are often the most helpful.Your caregiver may prescribe muscle relaxant drugs.These medicines help dull your pain so you can more quickly return to your normal activities and healthy exercise.  Put ice on the injured area.  Put ice in a plastic bag.  Place a towel between your skin and the bag.  Leave the ice on for 15-20 minutes, 03-04 times a day for the first 2 to 3 days. After that, ice and heat may be alternated to reduce pain and spasms.  Ask your caregiver about trying back exercises and gentle massage. This may be of some benefit.  Avoid feeling anxious or stressed.Stress increases muscle tension and can worsen back pain.It is important to recognize when you are anxious or stressed and learn ways to manage it.Exercise is a great option. SEEK MEDICAL CARE IF:  You have pain that is not relieved with rest  or medicine.  You have pain that does not improve in 1 week.  You have new symptoms.  You are generally not feeling well. SEEK IMMEDIATE MEDICAL CARE IF:   You have pain that radiates from your back into your legs.  You develop new bowel or bladder control problems.  You have unusual weakness or numbness in your arms or legs.  You develop nausea or vomiting.  You develop abdominal pain.  You feel faint. Document Released: 06/09/2005 Document Revised: 12/09/2011 Document Reviewed:  10/28/2010 [facility] Patient Information 2014 [place], [place].

## 2013-10-17 NOTE — ED Provider Notes (Signed)
CSN: [id number]     Arrival date & time 10/17/13  1321 History  This chart was scribed for non-physician practitioner, Johnnette Gourdobyn Albert, PA-C, working with Richardean Canalavid H Yao, MD, by Ellin MayhewMichael Levi, ED Scribe. This patient was seen in room TR11C/TR11C and the patient's care was started at 2:20 PM.  The history is provided by the patient. No language interpreter was used.   HPI Comments: Alicia Parks is a 38 y.o. Female, with a history of chronic back pain, who presents to the Emergency Department with a chief complaint of R upper back pain with onset 2 days ago following a fall, which occurred that day. She states she climbed on a chair when it lost grip with the floor and she fell on her back. Patient has tried epsom salt baths, hot pads and OTC medication with no relief. She has not seen a physician following the incident. Patient characterizes her pain as constant, non changing ache, and currently rates her pain as an 8/10 in severity. Patient reports that sitting straight up worsens the pain and makes her have lower back spasms. She denies any bladder or bowel inconvenience. Patient reports having an appointment with her physician in one month.  Past Medical History  Diagnosis Date  . Migraine   . Complication of anesthesia   . Ear drum perforation   . Back pain    Past Surgical History  Procedure Laterality Date  . Tubal ligation    . Tonsillectomy     Family History  Problem Relation Age of Onset  . Hypertension Father   . Heart disease Father    History  Substance Use Topics  . Smoking status: Never Smoker   . Smokeless tobacco: Not on file  . Alcohol Use: No   OB History   Grav Para Term Preterm Abortions TAB SAB Ect Mult Living   3 3 3  0 0 0 0 0 0 3     Review of Systems  Constitutional: Negative for fever and chills.  Respiratory: Negative for shortness of breath.   Gastrointestinal: Negative for nausea and vomiting.  Musculoskeletal: Positive for back pain.  Neurological:  Negative for weakness.  A complete 10 system review of systems was obtained and all systems are negative except as noted in the HPI and PMH.   Allergies  Compazine  Home Medications   Prior to Admission medications   Medication Sig Start Date End Date Taking? Authorizing Provider  diclofenac (CATAFLAM) 50 MG tablet Take 50 mg by mouth 2 (two) times daily.    Historical Provider, MD  gabapentin (NEURONTIN) 100 MG capsule Take 100 mg by mouth 2 (two) times daily.    Historical Provider, MD  loratadine (CLARITIN) 10 MG tablet Take 1 tablet (10 mg total) by mouth daily. 01/05/13   Shari A Upstill, PA-C  methocarbamol (ROBAXIN) 500 MG tablet Take 1 tablet (500 mg total) by mouth 2 (two) times daily. 03/31/13   Marissa Sciacca, PA-C  Multiple Vitamin (MULTIVITAMIN WITH MINERALS) TABS tablet Take 1 tablet by mouth daily.    Historical Provider, MD  naproxen (NAPROSYN) 500 MG tablet Take 1 tablet (500 mg total) by mouth 2 (two) times daily. 03/31/13   Marissa Sciacca, PA-C  oxyCODONE-acetaminophen (PERCOCET/ROXICET) 5-325 MG per tablet Take 1 tablet by mouth every 8 (eight) hours as needed for pain. 03/31/13   Marissa Sciacca, PA-C  ranitidine (ZANTAC) 150 MG tablet Take 150 mg by mouth 2 (two) times daily.    Historical Provider, MD  Triage Vitals: BP 137/81  Pulse 72  Temp(Src) 98 F (36.7 C) (Oral)  SpO2 100% Physical Exam  Nursing note and vitals reviewed. Constitutional: She is oriented to person, place, and time. She appears well-developed and well-nourished. No distress.  HENT:  Head: Normocephalic and atraumatic.  Mouth/Throat: Oropharynx is clear and moist.  Eyes: Conjunctivae are normal.  Neck: Normal range of motion. Neck supple. No spinous process tenderness and no muscular tenderness present.  Cardiovascular: Normal rate, regular rhythm and normal heart sounds.   Pulmonary/Chest: Effort normal and breath sounds normal. No respiratory distress.  Musculoskeletal: She exhibits  tenderness. She exhibits no edema.  TTP R para thoracic muscles. No bruising or signs of trauma. No bony tenderness. Strength and sensation equal and intact in upper extremities, bilaterally.  Neurological: She is alert and oriented to person, place, and time. She has normal strength.  Strength lower extremities 5/5 and equal bilateral. Sensation intact. Normal gait.  Skin: Skin is warm and dry. No rash noted. She is not diaphoretic.  Psychiatric: She has a normal mood and affect. Her behavior is normal.   ED Course  Procedures (including critical care time)  COORDINATION OF CARE: 2:24 PM-Will order a back xray to r/o fractures.Treatment plan discussed with patient and patient agrees.  Imaging Review Dg Thoracic Spine 2 View  10/17/2013   CLINICAL DATA:  Upper back pain status post fall  EXAM: THORACIC SPINE - 2 VIEW  COMPARISON:  None.  FINDINGS: There is no evidence of thoracic spine fracture. Alignment is normal. No other significant bone abnormalities are identified.  IMPRESSION: Negative.   Electronically Signed   By: Elige KoHetal  Patel   On: 10/17/2013 15:27   MDM   Final diagnoses:  Back pain   No red flags concerning patient's back pain. No s/s of central cord compression or cauda equina. Upper and lower extremities are neurovascularly intact and patient is ambulating without difficulty. No bruising or signs of trauma. Rest, NSAIDs, muscle relaxer. F/u with PCP. Return precautions given. Patient states understanding of treatment care plan and is agreeable.  I personally performed the services described in this documentation, which was scribed in my presence. The recorded information has been reviewed and is accurate.  Trevor MaceRobyn M Albert, PA-C 10/17/13 1549

## 2013-10-17 NOTE — ED Notes (Signed)
Pt reports falling Sat. Hitting right upper back, now having 8/10 pain. Hx chronic back pain. NAD. Ambulatory to triage.

## 2013-10-18 NOTE — ED Provider Notes (Signed)
Medical screening examination/treatment/procedure(s) were performed by non-physician practitioner and as supervising physician I was immediately available for consultation/collaboration.   EKG Interpretation None        Janssen Zee H Zamirah Denny, MD 10/18/13 0736 

## 2013-11-03 ENCOUNTER — Ambulatory Visit: Payer: Self-pay

## 2013-11-09 ENCOUNTER — Ambulatory Visit: Payer: Self-pay | Admitting: Internal Medicine

## 2013-11-30 ENCOUNTER — Ambulatory Visit: Payer: Self-pay

## 2013-12-06 ENCOUNTER — Emergency Department (HOSPITAL_COMMUNITY)
Admission: EM | Admit: 2013-12-06 | Discharge: 2013-12-07 | Disposition: A | Discharge status: Home / Self Care | Payer: Self-pay | Attending: Emergency Medicine | Admitting: Emergency Medicine

## 2013-12-06 ENCOUNTER — Encounter (HOSPITAL_COMMUNITY): Payer: Self-pay | Admitting: Emergency Medicine

## 2013-12-06 DIAGNOSIS — Y9239 Other specified sports and athletic area as the place of occurrence of the external cause: Secondary | ICD-10-CM | POA: Insufficient documentation

## 2013-12-06 DIAGNOSIS — L559 Sunburn, unspecified: Secondary | ICD-10-CM | POA: Insufficient documentation

## 2013-12-06 DIAGNOSIS — W899XXA Exposure to unspecified man-made visible and ultraviolet light, initial encounter: Secondary | ICD-10-CM | POA: Insufficient documentation

## 2013-12-06 DIAGNOSIS — IMO0002 Reserved for concepts with insufficient information to code with codable children: Secondary | ICD-10-CM | POA: Insufficient documentation

## 2013-12-06 DIAGNOSIS — Z3202 Encounter for pregnancy test, result negative: Secondary | ICD-10-CM | POA: Insufficient documentation

## 2013-12-06 DIAGNOSIS — Z79899 Other long term (current) drug therapy: Secondary | ICD-10-CM | POA: Insufficient documentation

## 2013-12-06 DIAGNOSIS — L550 Sunburn of first degree: Secondary | ICD-10-CM

## 2013-12-06 DIAGNOSIS — Y939 Activity, unspecified: Secondary | ICD-10-CM | POA: Insufficient documentation

## 2013-12-06 DIAGNOSIS — Y92838 Other recreation area as the place of occurrence of the external cause: Secondary | ICD-10-CM

## 2013-12-06 DIAGNOSIS — Z8669 Personal history of other diseases of the nervous system and sense organs: Secondary | ICD-10-CM | POA: Insufficient documentation

## 2013-12-06 DIAGNOSIS — Z8679 Personal history of other diseases of the circulatory system: Secondary | ICD-10-CM | POA: Insufficient documentation

## 2013-12-06 HISTORY — DX: Restless legs syndrome: G25.81

## 2013-12-06 HISTORY — DX: Fibromyalgia: M79.7

## 2013-12-06 LAB — BASIC METABOLIC PANEL
BUN: 10 mg/dL (ref 6–23)
CO2: 26 mEq/L (ref 19–32)
CREATININE: 0.64 mg/dL (ref 0.50–1.10)
Calcium: 9.4 mg/dL (ref 8.4–10.5)
Chloride: 99 mEq/L (ref 96–112)
GFR calc non Af Amer: >90 mL/min (ref >90)
Glucose, Bld: 89 mg/dL (ref 70–99)
Potassium: 3.7 mEq/L (ref 3.7–5.3)
SODIUM: 141 meq/L (ref 137–147)

## 2013-12-06 LAB — URINALYSIS, ROUTINE W REFLEX MICROSCOPIC
Bilirubin Urine: NEGATIVE
Glucose, UA: NEGATIVE mg/dL
Hgb urine dipstick: NEGATIVE
KETONES UR: NEGATIVE mg/dL
NITRITE: NEGATIVE
PH: 5 (ref 5.0–8.0)
Protein, ur: NEGATIVE mg/dL
SPECIFIC GRAVITY, URINE: 1.027 (ref 1.005–1.030)
Urobilinogen, UA: 1 mg/dL (ref 0.0–1.0)

## 2013-12-06 LAB — URINE MICROSCOPIC-ADD ON

## 2013-12-06 LAB — CBC WITH DIFFERENTIAL/PLATELET
BASOS PCT: 0 % (ref 0–1)
Basophils Absolute: 0 10*3/uL (ref 0.0–0.1)
Eosinophils Absolute: 0.1 10*3/uL (ref 0.0–0.7)
Eosinophils Relative: 1 % (ref 0–5)
HCT: 39.7 % (ref 36.0–46.0)
Hemoglobin: 13.9 g/dL (ref 12.0–15.0)
Lymphocytes Relative: 28 % (ref 12–46)
Lymphs Abs: 4.4 10*3/uL — ABNORMAL HIGH (ref 0.7–4.0)
MCH: 31.2 pg (ref 26.0–34.0)
MCHC: 35 g/dL (ref 30.0–36.0)
MCV: 89 fL (ref 78.0–100.0)
Monocytes Absolute: 0.7 10*3/uL (ref 0.1–1.0)
Monocytes Relative: 5 % (ref 3–12)
NEUTROS PCT: 66 % (ref 43–77)
Neutro Abs: 10.2 10*3/uL — ABNORMAL HIGH (ref 1.7–7.7)
Platelets: 374 10*3/uL (ref 150–400)
RBC: 4.46 MIL/uL (ref 3.87–5.11)
RDW: 13.2 % (ref 11.5–15.5)
WBC: 15.5 10*3/uL — ABNORMAL HIGH (ref 4.0–10.5)

## 2013-12-06 LAB — PREGNANCY, URINE: Preg Test, Ur: NEGATIVE

## 2013-12-06 MED ORDER — OXYCODONE-ACETAMINOPHEN 5-325 MG PO TABS
1.0000 | ORAL_TABLET | Freq: Once | ORAL | Status: AC
  Administered 2013-12-06: 1 via ORAL
  Filled 2013-12-06: qty 1

## 2013-12-06 NOTE — ED Notes (Signed)
Patient presents with both legs swelling.  [place] park.

## 2013-12-07 MED ORDER — PREDNISONE 20 MG PO TABS
60.0000 mg | ORAL_TABLET | Freq: Once | ORAL | Status: AC
  Administered 2013-12-07: 60 mg via ORAL
  Filled 2013-12-07: qty 3

## 2013-12-07 MED ORDER — PREDNISONE 10 MG PO TABS: 20.0000 mg | ORAL_TABLET | Freq: Every day | ORAL | Status: DC

## 2013-12-07 MED ORDER — HYDROCODONE-ACETAMINOPHEN 5-325 MG PO TABS: 1.0000 | ORAL_TABLET | Freq: Four times a day (QID) | ORAL | Status: DC | PRN

## 2013-12-07 NOTE — Discharge Instructions (Signed)
Sunburn  Sunburn is skin damage from being out in the sun too long. If you have light or fair skin, you may get sunburned more easily. Getting sunburned over and over can cause wrinkles and dark spots on the skin (sun spots). It can also increase your chance of getting skin cancer. HOME CARE  Avoid being out in the sun until your sunburn is gone.  Take a cool bath to help lessen pain. Put a cold, damp washcloth on the sunburn to help lessen pain. Do not put ice on the sunburn.  Only take medicine as told by your doctor.  Use sunburn creams or gels on your skin but not on blisters.  Drink enough fluids to keep your pee (urine) clear or pale yellow.  Do not break blisters. If blisters break, your doctor may tell you to use a medicated cream on the area. To keep from getting sunburned:  Avoid the sun between 10:00 a.m. and 4:00 p.m. during the day.  Put sunscreen on 30 minutes before being in the sun.  Wear a hat, clothing, and sunglasses to protect against the sun.  Avoid medicines, herbs, and foods that make you more sensitive to sun.  Avoid tanning beds. GET HELP RIGHT AWAY IF:  You have a fever.  You have pain and medicine does not help.  You throw up (vomit) or have watery poop (diarrhea).  You feel like you will pass out (faint).  You have a headache and feel confused.  You have very bad blisters.  You have yellowish-white fluid (pus) coming from your blisters.  Your burn gets more painful and puffy (swollen). MAKE SURE YOU:  Understand these instructions.  Will watch your condition.  Will get help right away if you are not doing well or get worse. Document Released: 02/19/2011 Document Revised: 10/04/2012 Document Reviewed: 02/19/2011 [facility] Patient Information 2014 [place]. You have a bad sunburn  You have been given a prescriptions for Prednisone that is a potent antiinflammatory medication  Please do not take additional Ibuprofen or Advil with  this.  You have also been give a prescription for Hydrocodone for pain control. You can safely place cool compresses to the affected areas or soak in a cool water bath  Please avoid ICE to the areas as this can cause more tissue damage.   You should start to see a difference in 1-2 days

## 2013-12-07 NOTE — ED Provider Notes (Signed)
CSN: [id number]     Arrival date & time 12/06/13  2050 History   First MD Initiated Contact with Patient 12/07/13 0000     Chief Complaint  Patient presents with  . Leg Swelling     (Consider location/radiation/quality/duration/timing/severity/associated sxs/prior Treatment) HPI Comments: PAtient [facility] and sustained a sunburn to her legs while in the wave pool  Now is having pain and swelling.  Has tried Aloe and cool soaks with little relief   The history is provided by the patient.    Past Medical History  Diagnosis Date  . Migraine   . Complication of anesthesia   . Ear drum perforation   . Back pain   . Fibromyalgia   . Restless leg syndrome    Past Surgical History  Procedure Laterality Date  . Tubal ligation    . Tonsillectomy     Family History  Problem Relation Age of Onset  . Hypertension Father   . Heart disease Father    History  Substance Use Topics  . Smoking status: Never Smoker   . Smokeless tobacco: Never Used  . Alcohol Use: No   OB History   Grav Para Term Preterm Abortions TAB SAB Ect Mult Living   3 3 3  0 0 0 0 0 0 3     Review of Systems  Constitutional: Negative for fever and chills.  Respiratory: Negative for cough.   Cardiovascular: Positive for leg swelling.  Gastrointestinal: Negative for nausea.  Skin: Positive for color change. Negative for rash and wound.  All other systems reviewed and are negative.     Allergies  Compazine  Home Medications   Prior to Admission medications   Medication Sig Start Date End Date Taking? Authorizing Provider  acetaminophen (TYLENOL) 325 MG tablet Take 650 mg by mouth every 6 (six) hours as needed for mild pain, moderate pain, fever or headache.   Yes Historical Provider, MD  Melatonin 5 MG CAPS Take 5 mg by mouth every other day.    Yes Historical Provider, MD  Multiple Vitamin (MULTIVITAMIN) tablet Take 1 tablet by mouth daily.   Yes Historical Provider, MD   Naproxen Sod-Diphenhydramine (ALEVE PM PO) Take 2 tablets by mouth at bedtime as needed (sleep).   Yes Historical Provider, MD  naproxen sodium (ANAPROX) 220 MG tablet Take 220-440 mg by mouth 2 (two) times daily as needed (pain).    Yes Historical Provider, MD  Omega-3 Fatty Acids (FISH OIL PO) Take 1 capsule by mouth daily.   Yes Historical Provider, MD  HYDROcodone-acetaminophen (NORCO/VICODIN) 5-325 MG per tablet Take 1 tablet by mouth every 6 (six) hours as needed. 12/07/13   Arman FilterGail K Schulz, NP  predniSONE (DELTASONE) 10 MG tablet Take 2 tablets (20 mg total) by mouth daily. 12/07/13   Arman FilterGail K Schulz, NP   BP 114/71  Pulse 70  Temp(Src) 98.3 F (36.8 C) (Oral)  Resp 16  Ht 5\' 4"  (1.626 m)  Wt 180 lb (81.647 kg)  BMI 30.88 kg/m2  SpO2 100%  LMP 11/11/2013 Physical Exam  Nursing note and vitals reviewed. Constitutional: She is oriented to person, place, and time. She appears well-developed and well-nourished.  HENT:  Head: Normocephalic.  Eyes: Pupils are equal, round, and reactive to light.  Neck: Normal range of motion.  Cardiovascular: Normal rate and regular rhythm.   Pulmonary/Chest: Effort normal and breath sounds normal.  Musculoskeletal: She exhibits edema and tenderness.  Anterior legs red, slightly swollen, and edematous  Neurological: She is alert and oriented to person, place, and time.  Skin: Skin is warm and dry. There is erythema.    ED Course  Procedures (including critical care time) Labs Review Labs Reviewed  CBC WITH DIFFERENTIAL - Abnormal; Notable for the following:    WBC 15.5 (*)    Neutro Abs 10.2 (*)    Lymphs Abs 4.4 (*)    All other components within normal limits  URINALYSIS, ROUTINE W REFLEX MICROSCOPIC - Abnormal; Notable for the following:    Leukocytes, UA TRACE (*)    All other components within normal limits  URINE MICROSCOPIC-ADD ON - Abnormal; Notable for the following:    Squamous Epithelial / LPF FEW (*)    All other components  within normal limits  BASIC METABOLIC PANEL  PREGNANCY, URINE    Imaging Review No results found.   EKG Interpretation None      MDM  Will place the patient on Prednisone burst for the next 5 days and well as pina control and cool compresses Final diagnoses:  Sunburn of first degree        Arman FilterGail K Schulz, NP 12/07/13 0019

## 2013-12-07 NOTE — ED Provider Notes (Signed)
Medical screening examination/treatment/procedure(s) were performed by non-physician practitioner and as supervising physician I was immediately available for consultation/collaboration.   EKG Interpretation None       Sarahlynn Cisnero K Ezeriah Luty-Rasch, MD 12/07/13 0127 

## 2013-12-21 ENCOUNTER — Ambulatory Visit: Payer: Self-pay

## 2014-01-24 ENCOUNTER — Inpatient Hospital Stay (HOSPITAL_COMMUNITY)
Admission: AD | Admit: 2014-01-24 | Discharge: 2014-01-24 | Disposition: A | Discharge status: Home / Self Care | Payer: Self-pay | Source: Ambulatory Visit | Attending: Obstetrics & Gynecology | Admitting: Obstetrics & Gynecology

## 2014-01-24 ENCOUNTER — Encounter (HOSPITAL_COMMUNITY): Payer: Self-pay | Admitting: *Deleted

## 2014-01-24 DIAGNOSIS — R109 Unspecified abdominal pain: Secondary | ICD-10-CM | POA: Insufficient documentation

## 2014-01-24 DIAGNOSIS — A5901 Trichomonal vulvovaginitis: Secondary | ICD-10-CM | POA: Insufficient documentation

## 2014-01-24 DIAGNOSIS — IMO0001 Reserved for inherently not codable concepts without codable children: Secondary | ICD-10-CM | POA: Insufficient documentation

## 2014-01-24 DIAGNOSIS — G2581 Restless legs syndrome: Secondary | ICD-10-CM | POA: Insufficient documentation

## 2014-01-24 LAB — URINALYSIS, ROUTINE W REFLEX MICROSCOPIC
Bilirubin Urine: NEGATIVE
Glucose, UA: NEGATIVE mg/dL
Hgb urine dipstick: NEGATIVE
Ketones, ur: NEGATIVE mg/dL
Leukocytes, UA: NEGATIVE
Nitrite: NEGATIVE
Protein, ur: NEGATIVE mg/dL
Specific Gravity, Urine: >1.03 — ABNORMAL HIGH (ref 1.005–1.030)
Urobilinogen, UA: 0.2 mg/dL (ref 0.0–1.0)
pH: 5.5 (ref 5.0–8.0)

## 2014-01-24 LAB — WET PREP, GENITAL
Clue Cells Wet Prep HPF POC: NONE SEEN
Yeast Wet Prep HPF POC: NONE SEEN

## 2014-01-24 LAB — POCT PREGNANCY, URINE: Preg Test, Ur: NEGATIVE

## 2014-01-24 MED ORDER — METRONIDAZOLE 500 MG PO TABS
2000.0000 mg | ORAL_TABLET | Freq: Once | ORAL | Status: AC
  Administered 2014-01-24: 2000 mg via ORAL
  Filled 2014-01-24: qty 4

## 2014-01-24 NOTE — MAU Provider Note (Signed)
History     CSN: [id number]  Arrival date and time: 01/24/14 4098   None     Chief Complaint  Patient presents with   Vaginal Discharge   Abdominal Pain   HPI Last seen here 18 months ago and told at that time that polyps were felt on vaginal exam.  3 months ago, she noticed an increase in discharge.  Discharge is sometimes yellow and sometimes white, always with an odor.  It is generally thick.  Menstrual cycles are irregular x 1 year.  No contraception used as partner incarcerated x 8 months.  She notes urinary frequency but no pain with urination.     OB History   Grav Para Term Preterm Abortions TAB SAB Ect Mult Living   3 3 3  0 0 0 0 0 0 3      Past Medical History  Diagnosis Date   Migraine    Complication of anesthesia    Ear drum perforation    Back pain    Fibromyalgia    Restless leg syndrome     Past Surgical History  Procedure Laterality Date   Tubal ligation     Tonsillectomy      Family History  Problem Relation Age of Onset   Hypertension Father    Heart disease Father     History  Substance Use Topics   Smoking status: Never Smoker    Smokeless tobacco: Never Used   Alcohol Use: No    Allergies:  Allergies  Allergen Reactions   Compazine [Prochlorperazine Edisylate] Other (See Comments)    Makes body move funny. Shaking    Prescriptions prior to admission  Medication Sig Dispense Refill   Melatonin 5 MG CAPS Take 5 mg by mouth every other day.        Multiple Vitamin (MULTIVITAMIN) tablet Take 1 tablet by mouth daily.       Omega-3 Fatty Acids (FISH OIL PO) Take 1 capsule by mouth daily.        Review of Systems  Constitutional: Negative for fever and chills.  Respiratory: Negative for cough and shortness of breath.   Cardiovascular: Negative for chest pain and palpitations.  Gastrointestinal: Negative for nausea, vomiting and abdominal pain.  Genitourinary: Positive for frequency. Negative for dysuria, urgency  and flank pain.  Musculoskeletal: Positive for back pain.  Skin: Negative for rash.  Neurological: Positive for headaches. Negative for dizziness.   Physical Exam   Blood pressure 128/75, pulse 81, temperature 97.9 F (36.6 C), temperature source Oral, resp. rate 18, height 5\' 4"  (1.626 m), weight 176 lb 9.6 oz (80.105 kg), last menstrual period 01/11/2014, SpO2 100.00%.  Physical Exam  Constitutional: She is oriented to person, place, and time. She appears well-developed and well-nourished. No distress.  HENT:  Head: Normocephalic.  Eyes: EOM are normal.  Neck: Normal range of motion.  Cardiovascular: Normal rate, regular rhythm and normal heart sounds.   Respiratory: Effort normal and breath sounds normal.  GI: Soft. She exhibits no distension. There is no tenderness.  Genitourinary: Uterus normal. Cervix exhibits no motion tenderness. Right adnexum displays no mass and no tenderness. Left adnexum displays no mass and no tenderness. Vaginal discharge (thin, white, malodorous discharge present in vault) found.  Musculoskeletal: Normal range of motion. She exhibits no edema.  Neurological: She is alert and oriented to person, place, and time.  Skin: Skin is warm and dry.  Psychiatric: She has a normal mood and affect.   Results for orders placed during  the hospital encounter of 01/24/14 (from the past 24 hour(s))  URINALYSIS, ROUTINE W REFLEX MICROSCOPIC     Status: Abnormal   Collection Time    01/24/14  9:00 AM      Result Value Ref Range   Color, Urine YELLOW  YELLOW   APPearance CLEAR  CLEAR   Specific Gravity, Urine >1.030 (*) 1.005 - 1.030   pH 5.5  5.0 - 8.0   Glucose, UA NEGATIVE  NEGATIVE mg/dL   Hgb urine dipstick NEGATIVE  NEGATIVE   Bilirubin Urine NEGATIVE  NEGATIVE   Ketones, ur NEGATIVE  NEGATIVE mg/dL   Protein, ur NEGATIVE  NEGATIVE mg/dL   Urobilinogen, UA 0.2  0.0 - 1.0 mg/dL   Nitrite NEGATIVE  NEGATIVE   Leukocytes, UA NEGATIVE  NEGATIVE  POCT PREGNANCY,  URINE     Status: None   Collection Time    01/24/14  9:21 AM      Result Value Ref Range   Preg Test, Ur NEGATIVE  NEGATIVE  WET PREP, GENITAL     Status: Abnormal   Collection Time    01/24/14 11:13 AM      Result Value Ref Range   Yeast Wet Prep HPF POC NONE SEEN  NONE SEEN   Trich, Wet Prep FEW (*) NONE SEEN   Clue Cells Wet Prep HPF POC NONE SEEN  NONE SEEN   WBC, Wet Prep HPF POC MODERATE (*) NONE SEEN    MAU Course  Procedures none  MDM Wet prep  Assessment and Plan  Assessment: Trichomoniasis  Plan: Flagyl 2 grams x 1 dose GC pending Advise partners No intercourse x 7 days Advise regular Pap testing  Bertram DenverKaren E Teague Clark 01/24/2014, 10:58 AM

## 2014-01-24 NOTE — Discharge Instructions (Signed)
Sexually Transmitted Disease A sexually transmitted disease (STD) is a disease or infection that may be passed (transmitted) from person to person, usually during sexual activity. This may happen by way of saliva, semen, blood, vaginal mucus, or urine. Common STDs include:   Gonorrhea.   Chlamydia.   Syphilis.   HIV and AIDS.   Genital herpes.   Hepatitis B and C.   Trichomonas.   Human papillomavirus (HPV).   Pubic lice.   Scabies.  Mites.  Bacterial vaginosis. WHAT ARE CAUSES OF STDs? An STD may be caused by bacteria, a virus, or parasites. STDs are often transmitted during sexual activity if one person is infected. However, they may also be transmitted through nonsexual means. STDs may be transmitted after:   Sexual intercourse with an infected person.   Sharing sex toys with an infected person.   Sharing needles with an infected person or using unclean piercing or tattoo needles.  Having intimate contact with the genitals, mouth, or rectal areas of an infected person.   Exposure to infected fluids during birth. WHAT ARE THE SIGNS AND SYMPTOMS OF STDs? Different STDs have different symptoms. Some people may not have any symptoms. If symptoms are present, they may include:   Painful or bloody urination.   Pain in the pelvis, abdomen, vagina, anus, throat, or eyes.   A skin rash, itching, or irritation.  Growths, ulcerations, blisters, or sores in the genital and anal areas.  Abnormal vaginal discharge with or without bad odor.   Penile discharge in men.   Fever.   Pain or bleeding during sexual intercourse.   Swollen glands in the groin area.   Yellow skin and eyes (jaundice). This is seen with hepatitis.   Swollen testicles.  Infertility.  Sores and blisters in the mouth. HOW ARE STDs DIAGNOSED? To make a diagnosis, your health care provider may:   Take a medical history.   Perform a physical exam.   Take a sample of  any discharge to examine.  Swab the throat, cervix, opening to the penis, rectum, or vagina for testing.  Test a sample of your first morning urine.   Perform blood tests.   Perform a Pap test, if this applies.   Perform a colposcopy.   Perform a laparoscopy.  HOW ARE STDs TREATED? Treatment depends on the STD. Some STDs may be treated but not cured.   Chlamydia, gonorrhea, trichomonas, and syphilis can be cured with antibiotic medicine.   Genital herpes, hepatitis, and HIV can be treated, but not cured, with prescribed medicines. The medicines lessen symptoms.   Genital warts from HPV can be treated with medicine or by freezing, burning (electrocautery), or surgery. Warts may come back.   HPV cannot be cured with medicine or surgery. However, abnormal areas may be removed from the cervix, vagina, or vulva.   If your diagnosis is confirmed, your recent sexual partners need treatment. This is true even if they are symptom-free or have a negative culture or evaluation. They should not have sex until their health care providers say it is okay. HOW CAN I REDUCE MY RISK OF GETTING AN STD? Take these steps to reduce your risk of getting an STD:  Use latex condoms, dental dams, and water-soluble lubricants during sexual activity. Do not use petroleum jelly or oils.  Avoid having multiple sex partners.  Do not have sex with someone who has other sex partners.  Do not have sex with anyone you do not know or who is at   high risk for an STD. °· Avoid risky sex practices that can break your skin. °· Do not have sex if you have open sores on your mouth or skin. °· Avoid drinking too much alcohol or taking illegal drugs. Alcohol and drugs can affect your judgment and put you in a vulnerable position. °· Avoid engaging in oral and anal sex acts. °· Get vaccinated for HPV and hepatitis. If you have not received these vaccines in the past, talk to your health care provider about whether one  or both might be right for you.   °· If you are at risk of being infected with HIV, it is recommended that you take a prescription medicine daily to prevent HIV infection. This is called pre-exposure prophylaxis (PrEP). You are considered at risk if: °· You are a man who has sex with other men (MSM). °· You are a heterosexual man or woman and are sexually active with more than one partner. °· You take drugs by injection. °· You are sexually active with a partner who has HIV. °· Talk with your health care provider about whether you are at high risk of being infected with HIV. If you choose to begin PrEP, you should first be tested for HIV. You should then be tested every 3 months for as long as you are taking PrEP.   °WHAT SHOULD I DO IF I THINK I HAVE AN STD? °· See your health care provider.   °· Tell your sexual partner(s). They should be tested and treated for any STDs. °· Do not have sex until your health care provider says it is okay.  °WHEN SHOULD I GET IMMEDIATE MEDICAL CARE? °Contact your health care provider right away if:  °· You have severe abdominal pain. °· You are a man and notice swelling or pain in your testicles. °· You are a woman and notice swelling or pain in your vagina. °Document Released: 08/30/2002 Document Revised: 06/14/2013 Document Reviewed: 12/28/2012 °ExitCare® Patient Information ©2015 [employer]. This information is not intended to replace advice given to you by your health care provider. Make sure you discuss any questions you have with your health care provider. ° °Trichomoniasis °Trichomoniasis is an infection caused by an organism called Trichomonas. The infection can affect both women and men. In women, the outer female genitalia and the vagina are affected. In men, the penis is mainly affected, but the prostate and other reproductive organs can also be involved. Trichomoniasis is a sexually transmitted infection (STI) and is most often passed to another person through sexual  contact.  °RISK FACTORS °· Having unprotected sexual intercourse. °· Having sexual intercourse with an infected partner. °SIGNS AND SYMPTOMS  °Symptoms of trichomoniasis in women include: °· Abnormal gray-green frothy vaginal discharge. °· Itching and irritation of the vagina. °· Itching and irritation of the area outside the vagina. °Symptoms of trichomoniasis in men include:  °· Penile discharge with or without pain. °· Pain during urination. This results from inflammation of the urethra. °DIAGNOSIS  °Trichomoniasis may be found during a Pap test or physical exam. Your health care provider may use one of the following methods to help diagnose this infection: °· Examining vaginal discharge under a microscope. For men, urethral discharge would be examined. °· Testing the pH of the vagina with a test tape. °· Using a vaginal swab test that checks for the Trichomonas organism. A test is available that provides results within a few minutes. °· Doing a culture test for the organism. This is not usually needed. °  TREATMENT  °· You may be given medicine to fight the infection. Women should inform their health care provider if they could be or are pregnant. Some medicines used to treat the infection should not be taken during pregnancy. °· Your health care provider may recommend over-the-counter medicines or creams to decrease itching or irritation. °· Your sexual partner will need to be treated if infected. °HOME CARE INSTRUCTIONS  °· Take medicines only as directed by your health care provider. °· Take over-the-counter medicine for itching or irritation as directed by your health care provider. °· Do not have sexual intercourse while you have the infection. °· Women should not douche or wear tampons while they have the infection. °· Discuss your infection with your partner. Your partner may have gotten the infection from you, or you may have gotten it from your partner. °· Have your sex partner get examined and treated if  necessary. °· Practice safe, informed, and protected sex. °· See your health care provider for other STI testing. °SEEK MEDICAL CARE IF:  °· You still have symptoms after you finish your medicine. °· You develop abdominal pain. °· You have pain when you urinate. °· You have bleeding after sexual intercourse. °· You develop a rash. °· Your medicine makes you sick or makes you throw up (vomit). °MAKE SURE YOU: °· Understand these instructions. °· Will watch your condition. °· Will get help right away if you are not doing well or get worse. °Document Released: 12/03/2000 Document Revised: 10/24/2013 Document Reviewed: 03/21/2013 °ExitCare® Patient Information ©2015 [employer]. This information is not intended to replace advice given to you by your health care provider. Make sure you discuss any questions you have with your health care provider. ° °

## 2014-01-24 NOTE — MAU Note (Signed)
Vaginal discharge for months, yellow/white, odorous.  Pt seen in MAU previously, was told she may have cervical polyps.  Denies bleeding.  Lower abd pain, much worse during period.

## 2014-01-25 LAB — GC/CHLAMYDIA PROBE AMP
CT PROBE, AMP APTIMA: NEGATIVE
GC Probe RNA: NEGATIVE

## 2014-02-04 ENCOUNTER — Encounter (HOSPITAL_COMMUNITY): Payer: Self-pay | Admitting: Emergency Medicine

## 2014-02-04 ENCOUNTER — Emergency Department (HOSPITAL_COMMUNITY): Payer: Self-pay

## 2014-02-04 ENCOUNTER — Emergency Department (HOSPITAL_COMMUNITY)
Admission: EM | Admit: 2014-02-04 | Discharge: 2014-02-04 | Disposition: A | Discharge status: Home / Self Care | Payer: Self-pay | Attending: Emergency Medicine | Admitting: Emergency Medicine

## 2014-02-04 DIAGNOSIS — X503XXA Overexertion from repetitive movements, initial encounter: Secondary | ICD-10-CM | POA: Insufficient documentation

## 2014-02-04 DIAGNOSIS — Z8669 Personal history of other diseases of the nervous system and sense organs: Secondary | ICD-10-CM | POA: Insufficient documentation

## 2014-02-04 DIAGNOSIS — S46909A Unspecified injury of unspecified muscle, fascia and tendon at shoulder and upper arm level, unspecified arm, initial encounter: Secondary | ICD-10-CM | POA: Insufficient documentation

## 2014-02-04 DIAGNOSIS — Z79899 Other long term (current) drug therapy: Secondary | ICD-10-CM | POA: Insufficient documentation

## 2014-02-04 DIAGNOSIS — M542 Cervicalgia: Secondary | ICD-10-CM | POA: Insufficient documentation

## 2014-02-04 DIAGNOSIS — IMO0001 Reserved for inherently not codable concepts without codable children: Secondary | ICD-10-CM | POA: Insufficient documentation

## 2014-02-04 DIAGNOSIS — S4980XA Other specified injuries of shoulder and upper arm, unspecified arm, initial encounter: Secondary | ICD-10-CM | POA: Insufficient documentation

## 2014-02-04 DIAGNOSIS — Y93E5 Activity, floor mopping and cleaning: Secondary | ICD-10-CM | POA: Insufficient documentation

## 2014-02-04 DIAGNOSIS — X58XXXA Exposure to other specified factors, initial encounter: Secondary | ICD-10-CM

## 2014-02-04 DIAGNOSIS — Y9289 Other specified places as the place of occurrence of the external cause: Secondary | ICD-10-CM | POA: Insufficient documentation

## 2014-02-04 DIAGNOSIS — M25519 Pain in unspecified shoulder: Secondary | ICD-10-CM | POA: Insufficient documentation

## 2014-02-04 DIAGNOSIS — Z8679 Personal history of other diseases of the circulatory system: Secondary | ICD-10-CM | POA: Insufficient documentation

## 2014-02-04 DIAGNOSIS — Y990 Civilian activity done for income or pay: Secondary | ICD-10-CM | POA: Insufficient documentation

## 2014-02-04 DIAGNOSIS — M62838 Other muscle spasm: Secondary | ICD-10-CM | POA: Insufficient documentation

## 2014-02-04 DIAGNOSIS — T148XXA Other injury of unspecified body region, initial encounter: Secondary | ICD-10-CM

## 2014-02-04 DIAGNOSIS — M25512 Pain in left shoulder: Secondary | ICD-10-CM

## 2014-02-04 MED ORDER — MELOXICAM 15 MG PO TABS: 7.5000 mg | ORAL_TABLET | Freq: Every day | ORAL | Status: DC

## 2014-02-04 MED ORDER — METHOCARBAMOL 500 MG PO TABS: 500.0000 mg | ORAL_TABLET | Freq: Three times a day (TID) | ORAL | Status: DC | PRN

## 2014-02-04 MED ORDER — OXYCODONE-ACETAMINOPHEN 5-325 MG PO TABS: 1.0000 | ORAL_TABLET | Freq: Four times a day (QID) | ORAL | Status: DC | PRN

## 2014-02-04 NOTE — ED Notes (Signed)
Per pt sts pain in left shoulder x 2 weeks after starting work. 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 sts hurts with any movement.

## 2014-02-04 NOTE — ED Provider Notes (Signed)
CSN: [id number]     Arrival date & time 02/04/14  1137 History  This chart was scribed for non-physician practitioner Allen Derry, PA-C  working with Flint Melter, MD by Leone Payor, ED Scribe. This patient was seen in room TR11C/TR11C and the patient's care was started at 2:32 PM.    Chief Complaint  Patient presents with  . Shoulder Pain    Patient is a 38 y.o. female presenting with shoulder pain. The history is provided by the patient. No language interpreter was used.  Shoulder Pain This is a new problem. The current episode started more than 1 week ago. The problem occurs constantly. Progression since onset: waxing and waning. Pertinent negatives include no chest pain, no abdominal pain, no headaches and no shortness of breath. The symptoms are aggravated by twisting and exertion. The symptoms are relieved by relaxation and rest. She has tried acetaminophen (OTC pain medication and massage therapy) for the symptoms. The treatment provided mild relief.    HPI Comments: Alicia Parks is a 38 y.o. female who presents to the Emergency Department complaining of constant, waxing and waning left shoulder blade pain that began 2.5 weeks. She reports radiation of pain up to the left neck and down the left arm. She describes the pain as sharp, aching, and throbbing; rates it as 7/10. Patient states the pain began after using a floor cleaning machine at [facility]. She has taken OTC pain medications (ibuprofen, tylenol, muscle rub), used heat, and received massages without relief. She denies numbness, paresthesias, weakness, back pain, abdominal pain, nausea, vomiting, chest pain, SOB, bowel or bladder incontinence.   Past Medical History  Diagnosis Date  . Migraine   . Complication of anesthesia   . Ear drum perforation   . Back pain   . Fibromyalgia   . Restless leg syndrome    Past Surgical History  Procedure Laterality Date  . Tubal ligation    . Tonsillectomy     Family History   Problem Relation Age of Onset  . Hypertension Father   . Heart disease Father    History  Substance Use Topics  . Smoking status: Never Smoker   . Smokeless tobacco: Never Used  . Alcohol Use: No   OB History   Grav Para Term Preterm Abortions TAB SAB Ect Mult Living   3 3 3  0 0 0 0 0 0 3     Review of Systems  Respiratory: Negative for shortness of breath.   Cardiovascular: Negative for chest pain.  Gastrointestinal: Negative for nausea, vomiting and abdominal pain.  Musculoskeletal: Positive for arthralgias (left shoulder) and neck pain. Negative for back pain.  Neurological: Negative for weakness, numbness and headaches.  10 Systems reviewed and are negative for acute change except as noted in the HPI.     Allergies  Compazine  Home Medications   Prior to Admission medications   Medication Sig Start Date End Date Taking? Authorizing Provider  Melatonin 5 MG CAPS Take 5 mg by mouth every other day.    Yes Historical Provider, MD  Multiple Vitamin (MULTIVITAMIN) tablet Take 1 tablet by mouth daily.   Yes Historical Provider, MD  Omega-3 Fatty Acids (FISH OIL PO) Take 1 capsule by mouth daily.   Yes Historical Provider, MD  meloxicam (MOBIC) 15 MG tablet Take 0.5 tablets (7.5 mg total) by mouth daily. Take with food. You may increase the dose to 0.5 tab (7.5mg ) twice daily if needed for additional pain relief. 02/04/14  Heyward Douthit Strupp Camprubi-Soms, PA-C  methocarbamol (ROBAXIN) 500 MG tablet Take 1 tablet (500 mg total) by mouth every 8 (eight) hours as needed for muscle spasms. 02/04/14   Tosha Belgarde Strupp Camprubi-Soms, PA-C  oxyCODONE-acetaminophen (PERCOCET) 5-325 MG per tablet Take 1-2 tablets by mouth every 6 (six) hours as needed for severe pain. 02/04/14   Shere Eisenhart Strupp Camprubi-Soms, PA-C   BP 115/78  Pulse 76  Temp(Src) 98.1 F (36.7 C) (Oral)  Resp 16  SpO2 98%  LMP 01/11/2014 Physical Exam  Nursing note and vitals reviewed. Constitutional: She is  oriented to person, place, and time. Vital signs are normal. She appears well-developed and well-nourished. No distress.  HENT:  Head: Normocephalic and atraumatic.  Mouth/Throat: Mucous membranes are normal.  Eyes: Conjunctivae and EOM are normal. Right eye exhibits no discharge. Left eye exhibits no discharge.  Neck: Normal range of motion. Neck supple. Muscular tenderness present. No spinous process tenderness present. No rigidity. No tracheal deviation and normal range of motion present.  FROM intact without spinous process TTP or deformity, no bony step offs. L sided paraspinous muscles TTP with spasm. No rigidity or meningeal signs  Cardiovascular: Normal rate and intact distal pulses.   Distal pulses intact  Pulmonary/Chest: Effort normal. No respiratory distress.  Abdominal: Normal appearance. She exhibits no distension.  Musculoskeletal:       Left shoulder: She exhibits decreased range of motion, pain and spasm. She exhibits no bony tenderness, no swelling, no effusion, no crepitus, no deformity and normal strength.  L shoulder with TTP along shoulder blade and paraspinous muscles. Spasm noted. No AC joint TTP, neg cross arm test. Neg apley scratch, hawkin's, int/ext rotation vs resistance, belly press off, and empty can testing. Strength 5/5 in all extremities. Sensation grossly intact in all extremities. ROM limited to approx 75% abduction of L shoulder, full adduction, full int/ext rotation. All spinous processes nonTTP with no paraspinous muscle TTP along thoracic and lumbar spine.   Neurological: She is alert and oriented to person, place, and time. She has normal strength. No sensory deficit.  Sensation grossly intact in all extremities, strength 5/5 in all extremities  Skin: Skin is warm, dry and intact. No rash noted. No erythema.  Psychiatric: She has a normal mood and affect. Her behavior is normal.    ED Course  Procedures (including critical care time)  DIAGNOSTIC  STUDIES: Oxygen Saturation is 98% on RA, normal by my interpretation.    COORDINATION OF CARE: 2:39 PM Discussed treatment plan with pt at bedside and pt agreed to plan.   Labs Review Labs Reviewed - No data to display  Imaging Review Dg Shoulder Left  02/04/2014   CLINICAL DATA:  Left shoulder pain  EXAM: LEFT SHOULDER - 2+ VIEW  COMPARISON:  None.  FINDINGS: Glenohumeral joint is intact. No evidence of scapular fracture or humeral fracture. The acromioclavicular joint is intact. There is a bony excrescence from the mid clavicle, unchanged  IMPRESSION: No acute osseous abnormality.   Electronically Signed   By: Genevive Bi M.D.   On: 02/04/2014 13:56     EKG Interpretation None      MDM   Final diagnoses:  Left shoulder pain  Repetitive motion injury  Muscle spasm of left shoulder   37y/o female here with L shoulder pain after repetitive motions at work. Spasms noted to L shoulder, with some limitations in ROM. Neurovascularly intact. Xray neg. Doubt rotator cuff injury, doubt AC joint issue. Doubt adhesive capsilitis. Pt given mobic, percocet,  and robaxin and discussed heat therapy. No need for further imaging or slings. I explained the diagnosis and have given explicit precautions to return to the ER including for any other new or worsening symptoms. The patient understands and accepts the medical plan as it's been dictated and I have answered their questions. Discharge instructions concerning home care and prescriptions have been given. The patient is STABLE and is discharged to home in good condition.  I personally performed the services described in this documentation, which was scribed in my presence. The recorded information has been reviewed and is accurate.  BP 115/78  Pulse 76  Temp(Src) 98.1 F (36.7 C) (Oral)  Resp 16  SpO2 98%  LMP 01/11/2014  Meds ordered this encounter  Medications  . oxyCODONE-acetaminophen (PERCOCET) 5-325 MG per tablet    Sig: Take 1-2  tablets by mouth every 6 (six) hours as needed for severe pain.    Dispense:  10 tablet    Refill:  0    Order Specific Question:  Supervising Provider    Answer:  Eber HongMILLER, BRIAN D [[id number]]  . methocarbamol (ROBAXIN) 500 MG tablet    Sig: Take 1 tablet (500 mg total) by mouth every 8 (eight) hours as needed for muscle spasms.    Dispense:  15 tablet    Refill:  0    Order Specific Question:  Supervising Provider    Answer:  Eber HongMILLER, BRIAN D [[id number]]  . meloxicam (MOBIC) 15 MG tablet    Sig: Take 0.5 tablets (7.5 mg total) by mouth daily. Take with food. You may increase the dose to 0.5 tab (7.5mg ) twice daily if needed for additional pain relief.    Dispense:  30 tablet    Refill:  0    Order Specific Question:  Supervising Provider    Answer:  Vida RollerMILLER, BRIAN D [address][[id number]]     Greyson Riccardi Strupp Camprubi-Soms, PA-C 02/04/14 1456

## 2014-02-04 NOTE — ED Notes (Signed)
Declined W/C at D/C and was escorted to lobby by RN. 

## 2014-02-04 NOTE — Discharge Instructions (Signed)
Your shoulder pain is likely related to a repetitive motion injury. Use robaxin to help with muscle spasms, heat to help with pain at a time every hour, and mobic and percocet as needed for pain. Do not drive or operate machinery while taking robaxin or percocet. Return to the ER for any changes or worsening symptoms. Perform gentle range of motion of your shoulder to help keep it from becoming frozen. See the orthopedist in 1-2 wks for re-evaluation and ongoing care.   Heat Therapy Heat therapy can help make painful, stiff muscles and joints feel better. Do not use heat on new injuries. Wait at least 48 hours after an injury to use heat. Do not use heat when you have aches or pains right after an activity. If you still have pain 3 hours after stopping the activity, then you may use heat. HOME CARE Wet heat pack  Soak a clean towel in warm water. Squeeze out the extra water.  Put the warm, wet towel in a plastic bag.  Place a thin, dry towel between your skin and the bag.  Put the heat pack on the area for 5 minutes, and check your skin. Your skin may be pink, but it should not be red.  Leave the heat pack on the area for 15 to 30 minutes.  Repeat this every 2 to 4 hours while awake. Do not use heat while you are sleeping. Warm water bath  Fill a tub with warm water.  Place the affected body part in the tub.  Soak the area for 20 to 40 minutes.  Repeat as needed. Hot water bottle  Fill the water bottle half full with hot water.  Press out the extra air. Close the cap tightly.  Place a dry towel between your skin and the bottle.  Put the bottle on the area for 5 minutes, and check your skin. Your skin may be pink, but it should not be red.  Leave the bottle on the area for 15 to 30 minutes.  Repeat this every 2 to 4 hours while awake. Electric heating pad  Place a dry towel between your skin and the heating pad.  Set the heating pad on low heat.  Put the heating  pad on the area for 10 minutes, and check your skin. Your skin may be pink, but it should not be red.  Leave the heating pad on the area for 20 to 40 minutes.  Repeat this every 2 to 4 hours while awake.  Do not lie on the heating pad.  Do not fall asleep while using the heating pad.  Do not use the heating pad near water. GET HELP RIGHT AWAY IF:  You get blisters or red skin.  Your skin is puffy (swollen), or you lose feeling (numbness) in the affected area.  You have any new problems.  Your problems are getting worse.  You have any questions or concerns. If you have any problems, stop using heat therapy until you see your doctor. MAKE SURE YOU:  Understand these instructions.  Will watch your condition.  Will get help right away if you are not doing well or get worse. Document Released: 09/01/2011 Document Reviewed: 08/02/2013 [facility] Patient Information 2015 [place], [place]. This information is not intended to replace advice given to you by your health care provider. Make sure you discuss any questions you have with your health care provider.  Shoulder Pain The shoulder is the joint that connects your arm to your  body. Muscles and band-like tissues that connect bones to muscles (tendons) hold the joint together. Shoulder pain is felt if an injury or medical problem affects one or more parts of the shoulder. HOME CARE   Put ice on the sore area.  Put ice in a plastic bag.  Place a towel between your skin and the bag.  Leave the ice on for 15-20 minutes, 03-04 times a day for the first 2 days.  Stop using cold packs if they do not help with the pain.  If you were given something to keep your shoulder from moving (sling; shoulder immobilizer), wear it as told. Only take it off to shower or bathe.  Move your arm as little as possible, but keep your hand moving to prevent puffiness (swelling).  Squeeze a soft ball or foam pad as much as possible to help prevent  swelling.  Take medicine as told by your doctor. GET HELP IF:  You have progressing new pain in your arm, hand, or fingers.  Your hand or fingers get cold.  Your medicine does not help lessen your pain. GET HELP RIGHT AWAY IF:   Your arm, hand, or fingers are numb or tingling.  Your arm, hand, or fingers are puffy (swollen), painful, or turn white or blue. MAKE SURE YOU:   Understand these instructions.  Will watch your condition.  Will get help right away if you are not doing well or get worse. Document Released: 11/26/2007 Document Revised: 10/24/2013 Document Reviewed: 12/22/2011 [facility] Patient Information 2015 [place], [place]. This information is not intended to replace advice given to you by your health care provider. Make sure you discuss any questions you have with your health care provider.  Repetitive Strain Injuries Repetitive strain injuries (RSIs) result from overuse or misuse of soft tissues including muscles, tendons, or nerves. Tendons are the cord-like structures that attach muscles to bones. RSIs can affect almost any part of the body. However, RSIs are most common in the arms (thumbs, wrists, elbows, shoulders) and legs (ankles, knees). Common medical conditions that are often caused by repetitive strain include carpal tunnel syndrome, tennis or golfer's elbow, bursitis, and tendonitis. If RSIs are treated early, and therepeated activity is reduced or removed, the severity and length of your problems can usually be reduced. RSIs are also called cumulative trauma disorders (CTD).  CAUSES  Many RSIs occur due to repeating the same activity at work over weeks or months without sufficient rest, such as prolonged typing. RSIs also commonly occur when a hobby or sport is done repeatedly without sufficient rest. RSIs can also occur due to repeated strain or stress on a body part in someone who has one or more risk factors for RSIs. RISK FACTORS Workplace risk  factors  Frequent computer use, especially if your workstation is not adjusted for your body type.  Infrequent rest breaks.  Working in a high-pressure environment.  Working at a [employer].  Repeating the same motion, such as frequent typing.  Working in an awkward position or holding the same position for a long time.  Forceful movements such as lifting, pulling, or pushing.  Vibration caused by using power tools.  Working in cold temperatures.  Job stress. Personal risk factors  Poor posture.  Being loose-jointed.  Not exercising regularly.  Being overweight.  Arthritis, diabetes, thyroid problems, or other long-term (chronic)medical conditions.  Vitamin deficiencies.  Keeping your fingernails long.  An unhealthy, stressful, or inactive lifestyle.  Not sleeping well. SYMPTOMS  Symptoms often  begin at work but become more noticeable after the repeated stress has ended. For example, you may develop fatigue or soreness in your wrist while typingat work, and at night you may develop numbness and tingling in your fingers. Common symptoms include:   Burning, shooting, or aching pain, especially in the fingers, palms, wrists, forearms, or shoulders.  Tenderness.  Swelling.  Tingling, numbness, or loss of feeling.  Pain with certain activities, such as turning a doorknob or reaching above your head.  Weakness, heaviness, or loss of coordination in yourhand.  Muscle spasms or tightness. In some cases, symptoms can become so intense that it is difficult to perform everyday tasks. Symptoms that do not improve with rest may indicate a more serious condition.  DIAGNOSIS  Your caregiver may determine the type ofRSI you have based on your medical evaluation and a description of your activities.  TREATMENT  Treatment depends on the severity and type of RSI you have. Your caregiver may recommend rest for the affected body part, medicines, and physical or occupational  therapy to reduce pain, swelling, and soreness. Discuss the activities you do repeatedly with your caregiver. Your caregiver can help you decide whether you need to change your activities. An RSI may take months or years to heal, especially if the affected body part gets insufficient rest. In some cases, such as severe carpal tunnel syndrome, surgery may be recommended. PREVENTION  Talk with your supervisor to make sure you have the proper equipment for your work station.  Maintain good posture at your desk or work station with:  Feet flat on the floor.  Knees directly over the feet, bent at a right angle.  Lower back supported by your chair or a cushion in the curve of your lower back.  Shoulders and arms relaxed and at your sides.  Neck relaxed and not bent forwards or backwards.  Your desk and computer workstation properly adjusted to your body type.  Your chair adjusted so there is no excess pressure on the back of your thighs.  The keyboard resting above your thighs. You should be able to reach the keys with your elbows at your side, bent at a right angle. Your arms should be supported on forearm rests, with your forearms parallel to the ground.  The computer mouse within easy reach.  The monitor directly in front of you, so that your eyes are aligned with the top of the screen. The screen should be about 15 to 25 inches from your eyes.  While typing, keep your wrist straight, in a neutral position. Move your entire arm when you move your mouse or when typing hard-to-reach keys.  Only use your computer as much as you need to for work. Do not use it during breaks.  Take breaks often from any repeated activity. Alternate with another task which requires you to use different muscles, or rest at least once every hour.  Change positions regularly. If you spend a lot of time sitting, get up, walk around, and stretch.  Do not hold pens or pencils tightly when writing.  Exercise  regularly.  Maintain a normal weight.  Eat a diet with plenty of vegetables, whole grains, and fruit.  Get sufficient, restful sleep. HOME CARE INSTRUCTIONS  If your caregiver prescribed medicine to help reduce swelling, take it as directed.  Only take over-the-counter or prescription medicines for pain, discomfort, or fever as directed by your caregiver.  Reduce, and if needed, stopthe activities that are causing your problems until  you have no further symptoms.If your symptoms are work-related, you may need to talk to your supervisor about changing your activities.  When symptoms develop, put ice or a cold pack on the aching area.  Put ice in a plastic bag.  Place a towel between your skin and the bag.  Leave the ice on for 15-20 minutes.  If you were given a splint to keep your wrist from bending, wear it as instructed. It is important to wear the splint at night. Use the splint for as long as your caregiver recommends. SEEK MEDICAL CARE IF:  You develop new problems.  Your problems do not get better with medicine. MAKE SURE YOU:  Understand these instructions.  Will watch your condition.  Will get help right away if you are not doing well or get worse. Document Released: 05/30/2002 Document Revised: 12/09/2011 Document Reviewed: 07/31/2011 [facility] Patient Information 2015 [place]. This information is not intended to replace advice given to you by your health care provider. Make sure you discuss any questions you have with your health care provider.

## 2014-02-05 MED ORDER — OXYCODONE-ACETAMINOPHEN 5-325 MG PO TABS: 1.0000 | ORAL_TABLET | Freq: Four times a day (QID) | ORAL | Status: DC | PRN

## 2014-02-05 NOTE — ED Provider Notes (Signed)
Medical screening examination/treatment/procedure(s) were performed by non-physician practitioner and as supervising physician I was immediately available for consultation/collaboration.  Flint MelterElliott L Merna Baldi, MD 02/05/14 [phone]

## 2014-04-24 ENCOUNTER — Encounter (HOSPITAL_COMMUNITY): Payer: Self-pay | Admitting: Emergency Medicine

## 2014-07-28 ENCOUNTER — Emergency Department (HOSPITAL_COMMUNITY)
Admission: EM | Admit: 2014-07-28 | Discharge: 2014-07-28 | Disposition: A | Discharge status: Home / Self Care | Payer: Self-pay | Attending: Emergency Medicine | Admitting: Emergency Medicine

## 2014-07-28 ENCOUNTER — Encounter (HOSPITAL_COMMUNITY): Payer: Self-pay | Admitting: Emergency Medicine

## 2014-07-28 ENCOUNTER — Emergency Department (HOSPITAL_COMMUNITY): Payer: Self-pay

## 2014-07-28 DIAGNOSIS — Z8679 Personal history of other diseases of the circulatory system: Secondary | ICD-10-CM | POA: Insufficient documentation

## 2014-07-28 DIAGNOSIS — R053 Chronic cough: Secondary | ICD-10-CM

## 2014-07-28 DIAGNOSIS — J069 Acute upper respiratory infection, unspecified: Secondary | ICD-10-CM | POA: Insufficient documentation

## 2014-07-28 DIAGNOSIS — R0989 Other specified symptoms and signs involving the circulatory and respiratory systems: Secondary | ICD-10-CM

## 2014-07-28 DIAGNOSIS — Z791 Long term (current) use of non-steroidal anti-inflammatories (NSAID): Secondary | ICD-10-CM | POA: Insufficient documentation

## 2014-07-28 DIAGNOSIS — B9789 Other viral agents as the cause of diseases classified elsewhere: Secondary | ICD-10-CM

## 2014-07-28 DIAGNOSIS — H9201 Otalgia, right ear: Secondary | ICD-10-CM | POA: Insufficient documentation

## 2014-07-28 DIAGNOSIS — R05 Cough: Secondary | ICD-10-CM

## 2014-07-28 DIAGNOSIS — M797 Fibromyalgia: Secondary | ICD-10-CM | POA: Insufficient documentation

## 2014-07-28 MED ORDER — ALBUTEROL SULFATE HFA 108 (90 BASE) MCG/ACT IN AERS
2.0000 | INHALATION_SPRAY | RESPIRATORY_TRACT | Status: DC | PRN
  Administered 2014-07-28: 2 via RESPIRATORY_TRACT
  Filled 2014-07-28: qty 6.7

## 2014-07-28 MED ORDER — HYDROCOD POLST-CHLORPHEN POLST 10-8 MG/5ML PO LQCR: 5.0000 mL | Freq: Two times a day (BID) | ORAL | Status: DC | PRN

## 2014-07-28 NOTE — ED Provider Notes (Signed)
CSN: [id number]     Arrival date & time 07/28/14  1943 History   This chart was scribed for Alicia Mornavid Aariyana Manz, NP working with Gilda Creasehristopher J. Pollina, * by Evon Slackerrance Branch, ED Scribe. This patient was seen in room TR07C/TR07C and the patient's care was started at 9:39 PM.     Chief Complaint  Patient presents with  . Cough  . Nasal Congestion  . Otalgia    Patient is a 39 y.o. female presenting with cough and ear pain. The history is provided by the patient. No language interpreter was used.  Cough Associated symptoms: chest pain, ear pain and rhinorrhea   Associated symptoms: no chills, no fever, no sore throat and no wheezing   Otalgia Associated symptoms: congestion, cough and rhinorrhea   Associated symptoms: no fever and no sore throat    HPI Comments: Cletis MediaLori D Parks is a 39 y.o. female who presents to the Emergency Department complaining of worsening congestion onset 4 days prior. Pt states she has associated productive cough, chest congestion, ear pain and rhinorrhea. Pt states she is having chest pain brought on from coughing.  Denies any recent sick contacts. Denies fever, chills, sore throat, trouble swallowing or wheezing.   Past Medical History  Diagnosis Date  . Migraine   . Complication of anesthesia   . Ear drum perforation   . Back pain   . Fibromyalgia   . Restless leg syndrome    Past Surgical History  Procedure Laterality Date  . Tubal ligation    . Tonsillectomy     Family History  Problem Relation Age of Onset  . Hypertension Father   . Heart disease Father    History  Substance Use Topics  . Smoking status: Never Smoker   . Smokeless tobacco: Never Used  . Alcohol Use: No   OB History    Gravida Para Term Preterm AB TAB SAB Ectopic Multiple Living   3 3 3  0 0 0 0 0 0 3     Review of Systems  Constitutional: Negative for fever and chills.  HENT: Positive for congestion, ear pain and rhinorrhea. Negative for sore throat and trouble swallowing.    Respiratory: Positive for cough. Negative for wheezing.   Cardiovascular: Positive for chest pain.  All other systems reviewed and are negative.     Allergies  Compazine  Home Medications   Prior to Admission medications   Medication Sig Start Date End Date Taking? Authorizing Provider  Melatonin 5 MG CAPS Take 5 mg by mouth every other day.     Historical Provider, MD  meloxicam (MOBIC) 15 MG tablet Take 0.5 tablets (7.5 mg total) by mouth daily. Take with food. You may increase the dose to 0.5 tab (7.5mg ) twice daily if needed for additional pain relief. 02/04/14   Mercedes Strupp Camprubi-Soms, PA-C  methocarbamol (ROBAXIN) 500 MG tablet Take 1 tablet (500 mg total) by mouth every 8 (eight) hours as needed for muscle spasms. 02/04/14   Mercedes Strupp Camprubi-Soms, PA-C  Multiple Vitamin (MULTIVITAMIN) tablet Take 1 tablet by mouth daily.    Historical Provider, MD  Omega-3 Fatty Acids (FISH OIL PO) Take 1 capsule by mouth daily.    Historical Provider, MD  oxyCODONE-acetaminophen (PERCOCET) 5-325 MG per tablet Take 1-2 tablets by mouth every 6 (six) hours as needed for severe pain. 02/04/14   Mercedes Strupp Camprubi-Soms, PA-C  oxyCODONE-acetaminophen (PERCOCET/ROXICET) 5-325 MG per tablet Take 1-2 tablets by mouth every 6 (six) hours as needed for moderate pain  or severe pain. 02/05/14   Shon Baton, MD   BP 136/84 mmHg  Pulse 99  Temp(Src) 98 F (36.7 C) (Oral)  Resp 20  SpO2 96%  LMP 06/27/2014   Physical Exam  Constitutional: She is oriented to person, place, and time. She appears well-developed and well-nourished. No distress.  HENT:  Head: Normocephalic and atraumatic.  Right Ear: Tympanic membrane normal.  Left Ear: Tympanic membrane normal.  Redness in right auditory canal.   Eyes: Conjunctivae and EOM are normal.  Neck: Neck supple. No tracheal deviation present.  Cardiovascular: Normal rate.   Pulmonary/Chest: Effort normal. No respiratory distress. She has  no wheezes.  Musculoskeletal: Normal range of motion.  Neurological: She is alert and oriented to person, place, and time.  Skin: Skin is warm and dry.  Psychiatric: She has a normal mood and affect. Her behavior is normal.  Nursing note and vitals reviewed.   ED Course  Procedures (including critical care time) DIAGNOSTIC STUDIES: Oxygen Saturation is 96% on RA, adequate by my interpretation.    COORDINATION OF CARE: 9:44 PM-Discussed treatment plan with pt at bedside and pt agreed to plan.     Labs Review Labs Reviewed - No data to display  Imaging Review Dg Chest 2 View  07/28/2014   CLINICAL DATA:  Persistent cough, chest congestion  EXAM: CHEST  2 VIEW  COMPARISON:  03/22/2013  FINDINGS: Lungs are clear.  No pleural effusion or pneumothorax.  The heart is normal in size.  Visualized osseous structures are within normal limits.  IMPRESSION: Normal chest radiographs.   Electronically Signed   By: Charline Bills M.D.   On: 07/28/2014 20:56     EKG Interpretation None     Radiology results reviewed and shared with patient.  Symptoms are consistent with viral upper respiratory infection with cough.  Will provide albuterol inhaler, rx for tussionex.  Return precautions discussed. MDM   Final diagnoses:  Persistent cough  Chest congestion   Viral URI with cough.    I personally performed the services described in this documentation, which was scribed in my presence. The recorded information has been reviewed and is accurate.      Jimmye Norman, NP 07/28/14 2356  Gilda Crease, MD 07/29/14 [phone]

## 2014-07-28 NOTE — Discharge Instructions (Signed)
Upper Respiratory Infection, Adult An upper respiratory infection (URI) is also sometimes known as the common cold. The upper respiratory tract includes the nose, sinuses, throat, trachea, and bronchi. Bronchi are the airways leading to the lungs. Most people improve within 1 week, but symptoms can last up to 2 weeks. A residual cough may last even longer.  CAUSES Many different viruses can infect the tissues lining the upper respiratory tract. The tissues become irritated and inflamed and often become very moist. Mucus production is also common. A cold is contagious. You can easily spread the virus to others by oral contact. This includes kissing, sharing a glass, coughing, or sneezing. Touching your mouth or nose and then touching a surface, which is then touched by another person, can also spread the virus. SYMPTOMS  Symptoms typically develop 1 to 3 days after you come in contact with a cold virus. Symptoms vary from person to person. They may include:  Runny nose.  Sneezing.  Nasal congestion.  Sinus irritation.  Sore throat.  Loss of voice (laryngitis).  Cough.  Fatigue.  Muscle aches.  Loss of appetite.  Headache.  Low-grade fever. DIAGNOSIS  You might diagnose your own cold based on familiar symptoms, since most people get a cold 2 to 3 times a year. Your caregiver can confirm this based on your exam. Most importantly, your caregiver can check that your symptoms are not due to another disease such as strep throat, sinusitis, pneumonia, asthma, or epiglottitis. Blood tests, throat tests, and X-rays are not necessary to diagnose a common cold, but they may sometimes be helpful in excluding other more serious diseases. Your caregiver will decide if any further tests are required. RISKS AND COMPLICATIONS  You may be at risk for a more severe case of the common cold if you smoke cigarettes, have chronic heart disease (such as heart failure) or lung disease (such as asthma), or if  you have a weakened immune system. The very young and very old are also at risk for more serious infections. Bacterial sinusitis, middle ear infections, and bacterial pneumonia can complicate the common cold. The common cold can worsen asthma and chronic obstructive pulmonary disease (COPD). Sometimes, these complications can require emergency medical care and may be life-threatening. PREVENTION  The best way to protect against getting a cold is to practice good hygiene. Avoid oral or hand contact with people with cold symptoms. Wash your hands often if contact occurs. There is no clear evidence that vitamin C, vitamin E, echinacea, or exercise reduces the chance of developing a cold. However, it is always recommended to get plenty of rest and practice good nutrition. TREATMENT  Treatment is directed at relieving symptoms. There is no cure. Antibiotics are not effective, because the infection is caused by a virus, not by bacteria. Treatment may include:  Increased fluid intake. Sports drinks offer valuable electrolytes, sugars, and fluids.  Breathing heated mist or steam (vaporizer or shower).  Eating chicken soup or other clear broths, and maintaining good nutrition.  Getting plenty of rest.  Using gargles or lozenges for comfort.  Controlling fevers with ibuprofen or acetaminophen as directed by your caregiver.  Increasing usage of your inhaler if you have asthma. Zinc gel and zinc lozenges, taken in the first 24 hours of the common cold, can shorten the duration and lessen the severity of symptoms. Pain medicines may help with fever, muscle aches, and throat pain. A variety of non-prescription medicines are available to treat congestion and runny nose. Your caregiver   can make recommendations and may suggest nasal or lung inhalers for other symptoms.  HOME CARE INSTRUCTIONS   Only take over-the-counter or prescription medicines for pain, discomfort, or fever as directed by your  caregiver.  Use a warm mist humidifier or inhale steam from a shower to increase air moisture. This may keep secretions moist and make it easier to breathe.  Drink enough water and fluids to keep your urine clear or pale yellow.  Rest as needed.  Return to work when your temperature has returned to normal or as your caregiver advises. You may need to stay home longer to avoid infecting others. You can also use a face mask and careful hand washing to prevent spread of the virus. SEEK MEDICAL CARE IF:   After the first few days, you feel you are getting worse rather than better.  You need your caregiver's advice about medicines to control symptoms.  You develop chills, worsening shortness of breath, or brown or red sputum. These may be signs of pneumonia.  You develop yellow or brown nasal discharge or pain in the face, especially when you bend forward. These may be signs of sinusitis.  You develop a fever, swollen neck glands, pain with swallowing, or white areas in the back of your throat. These may be signs of strep throat. SEEK IMMEDIATE MEDICAL CARE IF:   You have a fever.  You develop severe or persistent headache, ear pain, sinus pain, or chest pain.  You develop wheezing, a prolonged cough, cough up blood, or have a change in your usual mucus (if you have chronic lung disease).  You develop sore muscles or a stiff neck. Document Released: 12/03/2000 Document Revised: 09/01/2011 Document Reviewed: 09/14/2013 [employer] Patient Information 2015 [employer]. This information is not intended to replace advice given to you by your health care provider. Make sure you discuss any questions you have with your health care provider.  

## 2014-07-28 NOTE — ED Notes (Signed)
Pt. reports persistent productive cough , chest congestion , bilateral ear ache and runny nose/nasal congestion onset this week , denies fever or chills. Respirations unlabored .

## 2014-08-02 ENCOUNTER — Ambulatory Visit: Payer: Self-pay

## 2014-08-25 ENCOUNTER — Ambulatory Visit: Payer: Self-pay

## 2014-10-07 ENCOUNTER — Emergency Department (HOSPITAL_COMMUNITY)
Admission: EM | Admit: 2014-10-07 | Discharge: 2014-10-07 | Disposition: A | Discharge status: Home / Self Care | Payer: Self-pay | Attending: Emergency Medicine | Admitting: Emergency Medicine

## 2014-10-07 ENCOUNTER — Encounter (HOSPITAL_COMMUNITY): Payer: Self-pay

## 2014-10-07 ENCOUNTER — Emergency Department (HOSPITAL_COMMUNITY): Payer: Self-pay

## 2014-10-07 DIAGNOSIS — R42 Dizziness and giddiness: Secondary | ICD-10-CM | POA: Insufficient documentation

## 2014-10-07 DIAGNOSIS — Z791 Long term (current) use of non-steroidal anti-inflammatories (NSAID): Secondary | ICD-10-CM | POA: Insufficient documentation

## 2014-10-07 DIAGNOSIS — H539 Unspecified visual disturbance: Secondary | ICD-10-CM | POA: Insufficient documentation

## 2014-10-07 DIAGNOSIS — Z79899 Other long term (current) drug therapy: Secondary | ICD-10-CM | POA: Insufficient documentation

## 2014-10-07 DIAGNOSIS — R519 Headache, unspecified: Secondary | ICD-10-CM

## 2014-10-07 DIAGNOSIS — M797 Fibromyalgia: Secondary | ICD-10-CM | POA: Insufficient documentation

## 2014-10-07 DIAGNOSIS — R51 Headache: Secondary | ICD-10-CM | POA: Insufficient documentation

## 2014-10-07 DIAGNOSIS — Z8669 Personal history of other diseases of the nervous system and sense organs: Secondary | ICD-10-CM | POA: Insufficient documentation

## 2014-10-07 DIAGNOSIS — R112 Nausea with vomiting, unspecified: Secondary | ICD-10-CM | POA: Insufficient documentation

## 2014-10-07 DIAGNOSIS — Z8679 Personal history of other diseases of the circulatory system: Secondary | ICD-10-CM | POA: Insufficient documentation

## 2014-10-07 MED ORDER — KETOROLAC TROMETHAMINE 30 MG/ML IJ SOLN: 30.0000 mg | Freq: Once | INTRAMUSCULAR | Status: DC

## 2014-10-07 MED ORDER — METOCLOPRAMIDE HCL 5 MG/ML IJ SOLN: 10.0000 mg | Freq: Once | INTRAMUSCULAR | Status: DC

## 2014-10-07 MED ORDER — DIPHENHYDRAMINE HCL 50 MG/ML IJ SOLN
25.0000 mg | Freq: Once | INTRAMUSCULAR | Status: AC
  Administered 2014-10-07: 25 mg via INTRAVENOUS

## 2014-10-07 NOTE — ED Notes (Signed)
Pt reports she went to bed at 0700 today and woke up from a sound sleep with a sudden HA that was different from other ha. pT REPORTS hA STARTS on left posterior head and the pain radiates to front of head on left.Ptalso reports nausea without vomiting .

## 2014-10-07 NOTE — ED Notes (Signed)
Declined W/C at D/C and was escorted to lobby by RN. 

## 2014-10-07 NOTE — ED Notes (Signed)
Onset upon awakening 12pm  Left sided headache, squeezing. Intermittant blurred vision, nausea.  Pt has not taken any med for pain.

## 2014-10-07 NOTE — ED Provider Notes (Signed)
CSN: [id number]     Arrival date & time 10/07/14  1543 History   This chart was scribed for Dierdre Forth, PA-C working with Vanetta Mulders, MD by Evon Slack, ED Scribe. This patient was seen in room TR09C/TR09C and the patient's care was started at 4:33 PM.     Chief Complaint  Patient presents with  . Headache   Patient is a 39 y.o. female presenting with headaches. The history is provided by the patient. No language interpreter was used.  Headache Associated symptoms: dizziness, nausea and vomiting ( x1)   Associated symptoms: no abdominal pain, no back pain, no cough, no diarrhea, no fatigue, no fever, no neck stiffness and no photophobia    HPI Comments: Alicia Parks is a 39 y.o. female who presents to the Emergency Department complaining of new throbbing HA onset today at 12 PM that woke her from her sleep. Pt rates the severity of her HA 10/10. Pt reports associated nausea, dizziness and vomiting x1.  She also endorses intermittent vision changes since the onset, but none at present. Pt states that this HA is posterior and travels on the left side of her head radiating towards the front. Pt states that she feels as if the skin on the back of her head intermittently goes numb. Pt states this is not like previous headaches which are normally frontal. Pt states that she normally gets a HA about 1x per week. Pt denies photophobia, CP, SOB, syncope, fever, rash, neck pain and neck stiffness.  Past Medical History  Diagnosis Date  . Migraine   . Complication of anesthesia   . Ear drum perforation   . Back pain   . Fibromyalgia   . Restless leg syndrome    Past Surgical History  Procedure Laterality Date  . Tubal ligation    . Tonsillectomy     Family History  Problem Relation Age of Onset  . Hypertension Father   . Heart disease Father    History  Substance Use Topics  . Smoking status: Never Smoker   . Smokeless tobacco: Never Used  . Alcohol Use: No   OB  History    Gravida Para Term Preterm AB TAB SAB Ectopic Multiple Living   0 0 0 0 0 0 3      Review of Systems  Constitutional: Negative for fever, chills, diaphoresis, appetite change, fatigue and unexpected weight change.  HENT: Negative for mouth sores.   Eyes: Positive for visual disturbance (intermittent). Negative for photophobia.  Respiratory: Negative for cough, chest tightness, shortness of breath and wheezing.   Cardiovascular: Negative for chest pain.  Gastrointestinal: Positive for nausea and vomiting ( x1). Negative for abdominal pain, diarrhea and constipation.  Endocrine: Negative for polydipsia, polyphagia and polyuria.  Genitourinary: Negative for dysuria, urgency, frequency and hematuria.  Musculoskeletal: Negative for back pain and neck stiffness.  Skin: Negative for rash.  Allergic/Immunologic: Negative for immunocompromised state.  Neurological: Positive for dizziness and headaches. Negative for syncope and light-headedness.  Hematological: Does not bruise/bleed easily.  Psychiatric/Behavioral: Negative for sleep disturbance. The patient is not nervous/anxious.     Allergies  Compazine  Home Medications   Prior to Admission medications   Medication Sig Start Date End Date Taking? Authorizing Provider  chlorpheniramine-HYDROcodone (TUSSIONEX PENNKINETIC ER) 10-8 MG/5ML LQCR Take 5 mLs by mouth every 12 (twelve) hours as needed for cough. 07/28/14   Felicie Morn, NP  Melatonin 5 MG CAPS Take 5 mg by mouth every other  day.     Historical Provider, MD  meloxicam (MOBIC) 15 MG tablet Take 0.5 tablets (7.5 mg total) by mouth daily. Take with food. You may increase the dose to 0.5 tab (7.5mg ) twice daily if needed for additional pain relief. 02/04/14   Mercedes Camprubi-Soms, PA-C  methocarbamol (ROBAXIN) 500 MG tablet Take 1 tablet (500 mg total) by mouth every 8 (eight) hours as needed for muscle spasms. 02/04/14   Mercedes Camprubi-Soms, PA-C  Multiple Vitamin  (MULTIVITAMIN) tablet Take 1 tablet by mouth daily.    Historical Provider, MD  Omega-3 Fatty Acids (FISH OIL PO) Take 1 capsule by mouth daily.    Historical Provider, MD  oxyCODONE-acetaminophen (PERCOCET) 5-325 MG per tablet Take 1-2 tablets by mouth every 6 (six) hours as needed for severe pain. 02/04/14   Mercedes Camprubi-Soms, PA-C  oxyCODONE-acetaminophen (PERCOCET/ROXICET) 5-325 MG per tablet Take 1-2 tablets by mouth every 6 (six) hours as needed for moderate pain or severe pain. 02/05/14   Shon Baton, MD   BP 120/82 mmHg  Pulse 87  Temp(Src) 97.5 F (36.4 C) (Oral)  Resp 18  Ht  (1.626 m)  Wt 185 lb (83.915 kg)  BMI 31.74 kg/m2  SpO2 96%  LMP 09/24/2014   Physical Exam  Constitutional: She is oriented to person, place, and time. She appears well-developed and well-nourished. No distress.  HENT:  Head: Normocephalic and atraumatic.  Mouth/Throat: Oropharynx is clear and moist.  Eyes: Conjunctivae and EOM are normal. Pupils are equal, round, and reactive to light. No scleral icterus.  No horizontal, vertical or rotational nystagmus  Neck: Normal range of motion. Neck supple.  Full active and passive ROM without pain No midline or paraspinal tenderness No nuchal rigidity or meningeal signs  Cardiovascular: Normal rate, regular rhythm, normal heart sounds and intact distal pulses.   No murmur heard. Pulmonary/Chest: Effort normal and breath sounds normal. No respiratory distress. She has no wheezes. She has no rales.  Abdominal: Soft. Bowel sounds are normal. There is no tenderness. There is no rebound and no guarding.  Musculoskeletal: Normal range of motion.  Lymphadenopathy:    She has no cervical adenopathy.  Neurological: She is alert and oriented to person, place, and time. She has normal reflexes. No cranial nerve deficit. She exhibits normal muscle tone. Coordination normal.  Mental Status:  Alert, oriented, thought content appropriate. Speech fluent  without evidence of aphasia. Able to follow 2 step commands without difficulty.  Cranial Nerves:  II:  Peripheral visual fields grossly normal, pupils equal, round, reactive to light III,IV, VI: ptosis not present, extra-ocular motions intact bilaterally  V,VII: smile symmetric, facial light touch sensation equal VIII: hearing grossly normal bilaterally  IX,X: gag reflex present  XI: bilateral shoulder shrug equal and strong XII: midline tongue extension  Motor:  5/5 in upper and lower extremities bilaterally including strong and equal grip strength and dorsiflexion/plantar flexion Sensory: Pinprick and light touch normal in all extremities.  Deep Tendon Reflexes: 2+ and symmetric  Cerebellar: normal finger-to-nose with bilateral upper extremities Gait: normal gait and balance CV: distal pulses palpable throughout   Skin: Skin is warm and dry. No rash noted. She is not diaphoretic. No erythema.  Psychiatric: She has a normal mood and affect. Her behavior is normal. Judgment and thought content normal.  Nursing note and vitals reviewed.   ED Course  Procedures (including critical care time) DIAGNOSTIC STUDIES: Oxygen Saturation is 96% on RA, adequate by my interpretation.    COORDINATION OF CARE:  4:41 PM-Discussed treatment plan with pt at bedside and pt agreed to plan.     Labs Review Labs Reviewed - No data to display  Imaging Review Ct Head Wo Contrast  10/07/2014   CLINICAL DATA:  Left posterior headache since noon today.  EXAM: CT HEAD WITHOUT CONTRAST  TECHNIQUE: Contiguous axial images were obtained from the base of the skull through the vertex without intravenous contrast.  COMPARISON:  01/31/2010.  FINDINGS: Normal appearing cerebral hemispheres and posterior fossa structures. Normal size and position of the ventricles. No intracranial hemorrhage, mass lesion or CT evidence of acute infarction. Unremarkable bones and included paranasal sinuses.  IMPRESSION: Normal  examination.   Electronically Signed   By: Beckie SaltsSteven  Reid M.D.   On: 10/07/2014 17:49     EKG Interpretation None      MDM   Final diagnoses:  Nonintractable headache, unspecified chronicity pattern, unspecified headache type   Alicia LundborgLori D Manning presents with headache unlike any previous which woke her from sleep at 12:00.  Pt reports intermittent vision changes,  Nausea and vomiting x1, also unlike previous headaches.  Pt will need CT before 6pm to r/o SAH.  Normal neurologic exam.    BP 120/82 mmHg  Pulse 87  Temp(Src) 97.5 F (36.4 C) (Oral)  Resp 18  Ht 5\' 4"  (1.626 m)  Wt 185 lb (83.915 kg)  BMI 31.74 kg/m2  SpO2 96%  LMP 09/24/2014  5:53 PM Pt declined reglan, but reports some headache relief with benadryl.  CT without acute abnormality, specifically no SAH. Will give toradol for continued headache relief.    6:33 PM Pt reports total resolution of headache without Toradol.  Repeat Gross neurologic exam remains normal.  No nausea or vomiting at this time.  NO vomiting in the ED.    Pt HA treated and improved while in ED.  Presentation is non concerning for Meningitis, or temporal arteritis. CT without evidence of SAH at <6 hours since onset.  Pt is afebrile with no focal neuro deficits, nuchal rigidity.  Changes in vision have resolved. Pt is to follow up with PCP. Pt verbalizes understanding and is agreeable with plan to dc.   BP 120/82 mmHg  Pulse 87  Temp(Src) 97.5 F (36.4 C) (Oral)  Resp 18  Ht 5\' 4"  (1.626 m)  Wt 185 lb (83.915 kg)  BMI 31.74 kg/m2  SpO2 96%  LMP 09/24/2014      Dierdre ForthHannah Bronislaw Switzer, PA-C 10/07/14 1833  Vanetta MuldersScott Zackowski, MD 10/08/14 [id number]

## 2014-10-07 NOTE — Discharge Instructions (Signed)
1. Medications: usual home medications °2. Treatment: rest, drink plenty of fluids,  °3. Follow Up: Please followup with your primary doctor in 2 days for discussion of your diagnoses and further evaluation after today's visit; if you do not have a primary care doctor use the resource guide provided to find one; Please return to the ER for worsening symptoms ° ° ° ° °

## 2014-12-27 ENCOUNTER — Emergency Department (HOSPITAL_COMMUNITY): Payer: Self-pay

## 2014-12-27 ENCOUNTER — Emergency Department (HOSPITAL_COMMUNITY)
Admission: EM | Admit: 2014-12-27 | Discharge: 2014-12-27 | Disposition: A | Discharge status: Home / Self Care | Payer: Self-pay | Attending: Emergency Medicine | Admitting: Emergency Medicine

## 2014-12-27 DIAGNOSIS — Z8679 Personal history of other diseases of the circulatory system: Secondary | ICD-10-CM | POA: Insufficient documentation

## 2014-12-27 DIAGNOSIS — W231XXA Caught, crushed, jammed, or pinched between stationary objects, initial encounter: Secondary | ICD-10-CM | POA: Insufficient documentation

## 2014-12-27 DIAGNOSIS — Y939 Activity, unspecified: Secondary | ICD-10-CM | POA: Insufficient documentation

## 2014-12-27 DIAGNOSIS — Z8669 Personal history of other diseases of the nervous system and sense organs: Secondary | ICD-10-CM | POA: Insufficient documentation

## 2014-12-27 DIAGNOSIS — Y999 Unspecified external cause status: Secondary | ICD-10-CM | POA: Insufficient documentation

## 2014-12-27 DIAGNOSIS — Z791 Long term (current) use of non-steroidal anti-inflammatories (NSAID): Secondary | ICD-10-CM | POA: Insufficient documentation

## 2014-12-27 DIAGNOSIS — Y929 Unspecified place or not applicable: Secondary | ICD-10-CM | POA: Insufficient documentation

## 2014-12-27 DIAGNOSIS — S60221A Contusion of right hand, initial encounter: Secondary | ICD-10-CM

## 2014-12-27 DIAGNOSIS — M25531 Pain in right wrist: Secondary | ICD-10-CM

## 2014-12-27 DIAGNOSIS — Z79899 Other long term (current) drug therapy: Secondary | ICD-10-CM | POA: Insufficient documentation

## 2014-12-27 MED ORDER — NAPROXEN 500 MG PO TABS: 500.0000 mg | ORAL_TABLET | Freq: Two times a day (BID) | ORAL | Status: DC

## 2014-12-27 MED ORDER — OXYCODONE-ACETAMINOPHEN 5-325 MG PO TABS: 1.0000 | ORAL_TABLET | Freq: Four times a day (QID) | ORAL | Status: DC | PRN

## 2014-12-27 MED ORDER — HYDROCODONE-ACETAMINOPHEN 5-325 MG PO TABS
2.0000 | ORAL_TABLET | Freq: Once | ORAL | Status: AC
  Administered 2014-12-27: 2 via ORAL
  Filled 2014-12-27: qty 2

## 2014-12-27 NOTE — ED Notes (Signed)
Pt states that she slammed her Right hand in the car door which has caused her thumb, 1st and middle fingers to be bruised and swollen.  Pt states that she is unable to move her thumb in any directiona nd the pain is now up to her wrist.  Pian is 9/10. Pt took two Aleve and iced area.

## 2014-12-27 NOTE — ED Provider Notes (Signed)
CSN: [id number]     Arrival date & time 12/27/14  2110 History  This chart was scribed for non-physician practitioner Antony MaduraKelly Wilson Dusenbery, PA-C working with Purvis SheffieldForrest Harrison, MD by Lyndel SafeKaitlyn Shelton, ED Scribe. This patient was seen in room WTR8/WTR8 and the patient's care was started at 9:19 PM.   Chief Complaint  Patient presents with  . Wrist Injury   Patient is a 39 y.o. female presenting with wrist injury. The history is provided by the patient. No language interpreter was used.  Wrist Injury Location:  Wrist and hand Time since incident:  3 hours Injury: yes   Mechanism of injury: crush   Crush injury:    Mechanism:  Door Wrist location:  R wrist Hand location:  R hand Pain details:    Quality:  Throbbing and sharp   Radiates to:  Does not radiate   Severity:  Moderate   Onset quality:  Sudden   Duration:  3 hours   Timing:  Constant   Progression:  Unchanged  HPI Comments: Alicia Parks is a 39 y.o. female who presents to the Emergency Department complaining of sudden onset, constant, sharp and throbbing right wrist and hand pain s/p incident that occurred 3 hours ago. Pt states she slammed her right hand in her car door. She also reports she cannot move her right thumb. She notes intermittent numbness in her right hand. Pt is right handed. She has taken 2 aleve and applied ice to her right wrist/hand pta with mild to no relief.  She was driven by her husband to the ED today; pt is not driving home.   Past Medical History  Diagnosis Date  . Migraine   . Complication of anesthesia   . Ear drum perforation   . Back pain   . Fibromyalgia   . Restless leg syndrome    Past Surgical History  Procedure Laterality Date  . Tubal ligation    . Tonsillectomy     Family History  Problem Relation Age of Onset  . Hypertension Father   . Heart disease Father    History  Substance Use Topics  . Smoking status: Never Smoker   . Smokeless tobacco: Never Used  . Alcohol Use: No   OB  History    Gravida Para Term Preterm AB TAB SAB Ectopic Multiple Living   3 3 3  0 0 0 0 0 0 3      Review of Systems  Musculoskeletal: Positive for arthralgias.  All other systems reviewed and are negative.   Allergies  Compazine  Home Medications   Prior to Admission medications   Medication Sig Start Date End Date Taking? Authorizing Provider  chlorpheniramine-HYDROcodone (TUSSIONEX PENNKINETIC ER) 10-8 MG/5ML LQCR Take 5 mLs by mouth every 12 (twelve) hours as needed for cough. 07/28/14   Felicie Mornavid Smith, NP  Melatonin 5 MG CAPS Take 5 mg by mouth every other day.     Historical Provider, MD  meloxicam (MOBIC) 15 MG tablet Take 0.5 tablets (7.5 mg total) by mouth daily. Take with food. You may increase the dose to 0.5 tab (7.5mg ) twice daily if needed for additional pain relief. 02/04/14   Mercedes Camprubi-Soms, PA-C  methocarbamol (ROBAXIN) 500 MG tablet Take 1 tablet (500 mg total) by mouth every 8 (eight) hours as needed for muscle spasms. 02/04/14   Mercedes Camprubi-Soms, PA-C  Multiple Vitamin (MULTIVITAMIN) tablet Take 1 tablet by mouth daily.    Historical Provider, MD  naproxen (NAPROSYN) 500 MG tablet Take 1  tablet (500 mg total) by mouth 2 (two) times daily. 12/27/14   Antony Madura, PA-C  Omega-3 Fatty Acids (FISH OIL PO) Take 1 capsule by mouth daily.    Historical Provider, MD  oxyCODONE-acetaminophen (PERCOCET) 5-325 MG per tablet Take 1-2 tablets by mouth every 6 (six) hours as needed for severe pain. 12/27/14   Antony Madura, PA-C   BP 145/113 mmHg  Pulse 93  Temp(Src) 98.2 F (36.8 C) (Oral)  Ht 5\' 3"  (1.6 m)  Wt 193 lb (87.544 kg)  BMI 34.20 kg/m2  SpO2 95%  LMP 11/27/2014  Physical Exam  Constitutional: She is oriented to person, place, and time. She appears well-developed and well-nourished. No distress.  Nontoxic/nonseptic appearing  HENT:  Head: Normocephalic and atraumatic.  Eyes: Conjunctivae and EOM are normal. No scleral icterus.  Neck: Normal range of  motion.  Cardiovascular: Normal rate, regular rhythm and intact distal pulses.   Distal radial pulse 2+ in the right upper extremity. Capillary refill brisk in all digits of right hand.  Pulmonary/Chest: Effort normal. No respiratory distress.  Musculoskeletal: Normal range of motion.       Right wrist: She exhibits tenderness. She exhibits normal range of motion, no swelling, no crepitus and no deformity.       Right hand: She exhibits tenderness and swelling. She exhibits normal capillary refill and no deformity. Normal sensation noted.  Diffuse tenderness to right hand including the first through third digits. No pain or tenderness to the right fourth and fifth digits. No bony deformity or crepitus appreciated. There is mild soft tissue swelling. Diffuse tenderness to right wrist without specific bony tenderness or crepitus. Exam is significantly limited secondary to patient's level of cooperativeness. Patient able to wiggle all fingers of R hand.  Neurological: She is alert and oriented to person, place, and time. She exhibits normal muscle tone. Coordination normal.  Sensation to light touch intact in all digits of right hand.  Skin: Skin is warm and dry. No rash noted. She is not diaphoretic. No erythema. No pallor.  Psychiatric: She has a normal mood and affect. Her behavior is normal.  Nursing note and vitals reviewed.   ED Course  Procedures  DIAGNOSTIC STUDIES: Oxygen Saturation is 95% on RA, adequate by my interpretation.    COORDINATION OF CARE: 9:23 PM Discussed treatment plan which includes to get diagnostic imaging with pt. Will also order ice and pain medication. Pt acknowledges and agrees to plan.   Labs Review Labs Reviewed - No data to display  Imaging Review Dg Wrist Complete Right  12/27/2014   CLINICAL DATA:  The patient closer right hand in a truck door today. Pain. Initial encounter.  EXAM: RIGHT WRIST - COMPLETE 3+ VIEW  COMPARISON:  Plain films right hand  11/01/2006.  FINDINGS: There is no evidence of fracture or dislocation. There is no evidence of arthropathy or other focal bone abnormality. Soft tissues are unremarkable.  IMPRESSION: Negative exam.   Electronically Signed   By: Drusilla Kanner M.D.   On: 12/27/2014 21:50   Dg Hand Complete Right  12/27/2014   CLINICAL DATA:  Hand closed in truck door today. Right hand pain. Initial encounter.  EXAM: RIGHT HAND - COMPLETE 3+ VIEW  COMPARISON:  None.  FINDINGS: There is no evidence of fracture or dislocation. There is no evidence of arthropathy or other focal bone abnormality. Soft tissues are unremarkable.  IMPRESSION: Negative.   Electronically Signed   By: Myles Rosenthal M.D.   On: 12/27/2014  21:48     EKG Interpretation None      MDM   Final diagnoses:  Right wrist pain  Hand contusion, right, initial encounter    39 year old female with symptoms consistent with a hand and wrist contusion after getting her hand and wrist caught in a car door today. Patient neurovascularly intact. Imaging negative for fracture, dislocation, or bony deformity. Will manage with NSAIDs and RICE. Ace wrap applied prior to discharge. Return precautions discussed and provided. Patient discharged in good condition with no unaddressed concerns.  I personally performed the services described in this documentation, which was scribed in my presence. The recorded information has been reviewed and is accurate.   Filed Vitals:   12/27/14 2118  BP: 145/113  Pulse: 93  Temp: 98.2 F (36.8 C)  TempSrc: Oral  Height:  (1.6 m)  Weight: 193 lb (87.544 kg)  SpO2: 95%     Antony Madura, PA-C 12/27/14 2221  Purvis Sheffield, MD 12/27/14 2350

## 2014-12-27 NOTE — Discharge Instructions (Signed)
Contusion °A contusion is a deep bruise. Contusions are the result of an injury that caused bleeding under the skin. The contusion may turn blue, purple, or yellow. Minor injuries will give you a painless contusion, but more severe contusions may stay painful and swollen for a few weeks.  °CAUSES  °A contusion is usually caused by a blow, trauma, or direct force to an area of the body. °SYMPTOMS  °· Swelling and redness of the injured area. °· Bruising of the injured area. °· Tenderness and soreness of the injured area. °· Pain. °DIAGNOSIS  °The diagnosis can be made by taking a history and physical exam. An X-ray, CT scan, or MRI may be needed to determine if there were any associated injuries, such as fractures. °TREATMENT  °Specific treatment will depend on what area of the body was injured. In general, the best treatment for a contusion is resting, icing, elevating, and applying cold compresses to the injured area. Over-the-counter medicines may also be recommended for pain control. Ask your caregiver what the best treatment is for your contusion. °HOME CARE INSTRUCTIONS  °· Put ice on the injured area. °· Put ice in a plastic bag. °· Place a towel between your skin and the bag. °· Leave the ice on for 15-20 minutes, 3-4 times a day, or as directed by your health care provider. °· Only take over-the-counter or prescription medicines for pain, discomfort, or fever as directed by your caregiver. Your caregiver may recommend avoiding anti-inflammatory medicines (aspirin, ibuprofen, and naproxen) for 48 hours because these medicines may increase bruising. °· Rest the injured area. °· If possible, elevate the injured area to reduce swelling. °SEEK IMMEDIATE MEDICAL CARE IF:  °· You have increased bruising or swelling. °· You have pain that is getting worse. °· Your swelling or pain is not relieved with medicines. °MAKE SURE YOU:  °· Understand these instructions. °· Will watch your condition. °· Will get help right  away if you are not doing well or get worse. °Document Released: 03/19/2005 Document Revised: 06/14/2013 Document Reviewed: 04/14/2011 °[employer]® Patient Information ©2015 [employer]. This information is not intended to replace advice given to you by your health care provider. Make sure you discuss any questions you have with your health care provider. °RICE: Routine Care for Injuries °The routine care of many injuries includes Rest, Ice, Compression, and Elevation (RICE). °HOME CARE INSTRUCTIONS °· Rest is needed to allow your body to heal. Routine activities can usually be resumed when comfortable. Injured tendons and bones can take up to 6 weeks to heal. Tendons are the cord-like structures that attach muscle to bone. °· Ice following an injury helps keep the swelling down and reduces pain. °¨ Put ice in a plastic bag. °¨ Place a towel between your skin and the bag. °¨ Leave the ice on for 15-20 minutes, 3-4 times a day, or as directed by your health care provider. Do this while awake, for the first 24 to 48 hours. After that, continue as directed by your caregiver. °· Compression helps keep swelling down. It also gives support and helps with discomfort. If an elastic bandage has been applied, it should be removed and reapplied every 3 to 4 hours. It should not be applied tightly, but firmly enough to keep swelling down. Watch fingers or toes for swelling, bluish discoloration, coldness, numbness, or excessive pain. If any of these problems occur, remove the bandage and reapply loosely. Contact your caregiver if these problems continue. °· Elevation helps reduce swelling   and decreases pain. With extremities, such as the arms, hands, legs, and feet, the injured area should be placed near or above the level of the heart, if possible. °SEEK IMMEDIATE MEDICAL CARE IF: °· You have persistent pain and swelling. °· You develop redness, numbness, or unexpected weakness. °· Your symptoms are getting worse rather than  improving after several days. °These symptoms may indicate that further evaluation or further X-rays are needed. Sometimes, X-rays may not show a small broken bone (fracture) until 1 week or 10 days later. Make a follow-up appointment with your caregiver. Ask when your X-ray results will be ready. Make sure you get your X-ray results. °Document Released: 09/21/2000 Document Revised: 06/14/2013 Document Reviewed: 11/08/2010 °Exit[employer]® Patient Information ©2015 [employer]. This information is not intended to replace advice given to you by your health care provider. Make sure you discuss any questions you have with your health care provider. ° °

## 2015-02-27 ENCOUNTER — Emergency Department (HOSPITAL_COMMUNITY)
Admission: EM | Admit: 2015-02-27 | Discharge: 2015-02-27 | Discharge status: Against Medical Advice | Payer: Self-pay | Attending: Emergency Medicine | Admitting: Emergency Medicine

## 2015-02-27 ENCOUNTER — Encounter (HOSPITAL_COMMUNITY): Payer: Self-pay | Admitting: Emergency Medicine

## 2015-02-27 DIAGNOSIS — Y998 Other external cause status: Secondary | ICD-10-CM | POA: Insufficient documentation

## 2015-02-27 DIAGNOSIS — R55 Syncope and collapse: Secondary | ICD-10-CM | POA: Insufficient documentation

## 2015-02-27 DIAGNOSIS — Y9389 Activity, other specified: Secondary | ICD-10-CM | POA: Insufficient documentation

## 2015-02-27 DIAGNOSIS — S0512XA Contusion of eyeball and orbital tissues, left eye, initial encounter: Secondary | ICD-10-CM | POA: Insufficient documentation

## 2015-02-27 DIAGNOSIS — Y9289 Other specified places as the place of occurrence of the external cause: Secondary | ICD-10-CM | POA: Insufficient documentation

## 2015-02-27 NOTE — ED Notes (Signed)
Pt called x 1 to go back without answer.
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
## 2015-02-27 NOTE — ED Notes (Signed)
Pt called x 1 to go back without answer.  

## 2015-05-02 ENCOUNTER — Ambulatory Visit: Payer: Self-pay | Admitting: Internal Medicine

## 2015-07-21 ENCOUNTER — Emergency Department (HOSPITAL_COMMUNITY)
Admission: EM | Admit: 2015-07-21 | Discharge: 2015-07-21 | Disposition: A | Discharge status: Home / Self Care | Payer: Self-pay | Attending: Emergency Medicine | Admitting: Emergency Medicine

## 2015-07-21 ENCOUNTER — Encounter (HOSPITAL_COMMUNITY): Payer: Self-pay | Admitting: Emergency Medicine

## 2015-07-21 ENCOUNTER — Emergency Department (HOSPITAL_COMMUNITY): Payer: Self-pay

## 2015-07-21 DIAGNOSIS — Y9289 Other specified places as the place of occurrence of the external cause: Secondary | ICD-10-CM | POA: Insufficient documentation

## 2015-07-21 DIAGNOSIS — Z791 Long term (current) use of non-steroidal anti-inflammatories (NSAID): Secondary | ICD-10-CM | POA: Insufficient documentation

## 2015-07-21 DIAGNOSIS — M797 Fibromyalgia: Secondary | ICD-10-CM | POA: Insufficient documentation

## 2015-07-21 DIAGNOSIS — Y93K1 Activity, walking an animal: Secondary | ICD-10-CM | POA: Insufficient documentation

## 2015-07-21 DIAGNOSIS — Z79899 Other long term (current) drug therapy: Secondary | ICD-10-CM | POA: Insufficient documentation

## 2015-07-21 DIAGNOSIS — Y998 Other external cause status: Secondary | ICD-10-CM | POA: Insufficient documentation

## 2015-07-21 DIAGNOSIS — M25511 Pain in right shoulder: Secondary | ICD-10-CM

## 2015-07-21 DIAGNOSIS — S4991XA Unspecified injury of right shoulder and upper arm, initial encounter: Secondary | ICD-10-CM | POA: Insufficient documentation

## 2015-07-21 DIAGNOSIS — M549 Dorsalgia, unspecified: Secondary | ICD-10-CM

## 2015-07-21 DIAGNOSIS — Y9389 Activity, other specified: Secondary | ICD-10-CM | POA: Insufficient documentation

## 2015-07-21 DIAGNOSIS — Z8669 Personal history of other diseases of the nervous system and sense organs: Secondary | ICD-10-CM | POA: Insufficient documentation

## 2015-07-21 DIAGNOSIS — X58XXXA Exposure to other specified factors, initial encounter: Secondary | ICD-10-CM | POA: Insufficient documentation

## 2015-07-21 DIAGNOSIS — S161XXA Strain of muscle, fascia and tendon at neck level, initial encounter: Secondary | ICD-10-CM | POA: Insufficient documentation

## 2015-07-21 DIAGNOSIS — Z8679 Personal history of other diseases of the circulatory system: Secondary | ICD-10-CM | POA: Insufficient documentation

## 2015-07-21 MED ORDER — KETOROLAC TROMETHAMINE 30 MG/ML IJ SOLN
30.0000 mg | Freq: Once | INTRAMUSCULAR | Status: AC
  Administered 2015-07-21: 30 mg via INTRAMUSCULAR
  Filled 2015-07-21: qty 1

## 2015-07-21 MED ORDER — IBUPROFEN 800 MG PO TABS: 800.0000 mg | ORAL_TABLET | Freq: Three times a day (TID) | ORAL | Status: DC

## 2015-07-21 MED ORDER — METHOCARBAMOL 500 MG PO TABS
500.0000 mg | ORAL_TABLET | Freq: Once | ORAL | Status: AC
  Administered 2015-07-21: 500 mg via ORAL
  Filled 2015-07-21: qty 1

## 2015-07-21 MED ORDER — METHOCARBAMOL 500 MG PO TABS: 500.0000 mg | ORAL_TABLET | Freq: Two times a day (BID) | ORAL | Status: DC

## 2015-07-21 NOTE — Discharge Instructions (Signed)
1. Medications: Robaxin is your muscle relaxer - This can make you very drowsy - please do not drink or drive on this medication, ibuprofen three times daily for the next three days - then take as needed for pain, continue usual home medications 2. Treatment: rest, drink plenty of fluids, ice affected area (instructions below)  3. Follow Up: Please follow up with the orthopedic clinic listed above if symptoms do not improve after one week for discussion of your diagnoses and further evaluation after today's visit; Please return to the ER for new or worsening symptoms, any additional concerns.   COLD THERAPY DIRECTIONS:  Ice or gel packs can be used to reduce both pain and swelling. Ice is the most helpful within the first 24 to 48 hours after an injury or flareup from overusing a muscle or joint.  Ice is effective, has very few side effects, and is safe for most people to use.   If you expose your skin to cold temperatures for too long or without the proper protection, you can damage your skin or nerves. Watch for signs of skin damage due to cold.   HOME CARE INSTRUCTIONS  Follow these tips to use ice and cold packs safely.  Place a dry or damp towel between the ice and skin. A damp towel will cool the skin more quickly, so you may need to shorten the time that the ice is used.  For a more rapid response, add gentle compression to the ice.  Ice for no more than 10 to 20 minutes at a time. The bonier the area you are icing, the less time it will take to get the benefits of ice.  Check your skin after 5 minutes to make sure there are no signs of a poor response to cold or skin damage.  Rest 20 minutes or more in between uses.  Once your skin is numb, you can end your treatment. You can test numbness by very lightly touching your skin. The touch should be so light that you do not see the skin dimple from the pressure of your fingertip. When using ice, most people will feel these normal sensations in  this order: cold, burning, aching, and numbness.

## 2015-07-21 NOTE — ED Provider Notes (Signed)
CSN: [id number]     Arrival date & time 07/21/15  1202 History  By signing my name below, I, Murriel Hopper, attest that this documentation has been prepared under the direction and in the presence of [facility], PA-C. Marland Kitchen Electronically Signed: Murriel Hopper, ED Scribe. 07/21/2015. 12:41 PM.   Chief Complaint  Patient presents with  . Shoulder Pain      Patient is a 40 y.o. female presenting with shoulder pain. The history is provided by the patient. No language interpreter was used.  Shoulder Pain Associated symptoms: neck pain   Associated symptoms: no fever     HPI Comments: Alicia Parks is a 39 y.o. female who presents to the Emergency Department complaining of intermittent right shoulder pain that worsens with movement that radiates to the right side of her neck that has been present for four days. Pt reports she doesn't recall a specific injury, but states she has been walking her dog a lot recently, and her dog frequently tries to run away, which jerks her and pulls her forward. Pt states she uses her right hand to hold the leash. Pt reports associated intermittent tingling and numbness in her right arm and hand. Pt states states that certain movements of her arm and movements of her neck cause her pain and send a sharp, shooting pain up through the right side of her neck. Pt states she has been taking Ibuprofen and using heating pads with no relief. Pt denies fever, chills. Pt denies recent steroid use, recent IV drug use.    Past Medical History  Diagnosis Date  . Migraine   . Complication of anesthesia   . Ear drum perforation   . Back pain   . Fibromyalgia   . Restless leg syndrome    Past Surgical History  Procedure Laterality Date  . Tubal ligation    . Tonsillectomy     Family History  Problem Relation Age of Onset  . Hypertension Father   . Heart disease Father    Social History  Substance Use Topics  . Smoking status: Never Smoker   . Smokeless tobacco: Never  Used  . Alcohol Use: No   OB History    Gravida Para Term Preterm AB TAB SAB Ectopic Multiple Living   0 0 0 0 0 0 3     Review of Systems  Constitutional: Negative for fever and chills.  Gastrointestinal: Negative for vomiting and diarrhea.  Musculoskeletal: Positive for myalgias, neck pain and neck stiffness.  Neurological: Negative for dizziness and headaches.      Allergies  Compazine  Home Medications   Prior to Admission medications   Medication Sig Start Date End Date Taking? Authorizing Provider  chlorpheniramine-HYDROcodone (TUSSIONEX PENNKINETIC ER) 10-8 MG/5ML LQCR Take 5 mLs by mouth every 12 (twelve) hours as needed for cough. 07/28/14   Felicie Morn, NP  ibuprofen (ADVIL,MOTRIN) 800 MG tablet Take 1 tablet (800 mg total) by mouth 3 (three) times daily. 07/21/15   Lucyle Alumbaugh Pilcher Harutyun Monteverde, PA-C  Melatonin 5 MG CAPS Take 5 mg by mouth every other day.     Historical Provider, MD  meloxicam (MOBIC) 15 MG tablet Take 0.5 tablets (7.5 mg total) by mouth daily. Take with food. You may increase the dose to 0.5 tab (7.5mg ) twice daily if needed for additional pain relief. 02/04/14   Mercedes Camprubi-Soms, PA-C  methocarbamol (ROBAXIN) 500 MG tablet Take 1 tablet (500 mg total) by mouth 2 (two) times daily. 07/21/15  [facility] Kristine Tiley, PA-C  Multiple Vitamin (MULTIVITAMIN) tablet Take 1 tablet by mouth daily.    Historical Provider, MD  naproxen (NAPROSYN) 500 MG tablet Take 1 tablet (500 mg total) by mouth 2 (two) times daily. 12/27/14   Antony Madura, PA-C  Omega-3 Fatty Acids (FISH OIL PO) Take 1 capsule by mouth daily.    Historical Provider, MD  oxyCODONE-acetaminophen (PERCOCET) 5-325 MG per tablet Take 1-2 tablets by mouth every 6 (six) hours as needed for severe pain. 12/27/14   Antony Madura, PA-C   BP 154/92 mmHg  Pulse 76  Temp(Src) 98.1 F (36.7 C) (Oral)  Resp 16  SpO2 100%  LMP 06/12/2015 Physical Exam  Constitutional: She is oriented to person, place, and time. She  appears well-developed and well-nourished. No distress.  HENT:  Head: Normocephalic and atraumatic.  Neck: Neck supple. No tracheal deviation present.  Decreased ROM 2/2 pain No c-spine bony tenderness  Cardiovascular: Normal rate and regular rhythm.  Exam reveals no gallop and no friction rub.   No murmur heard. Pulmonary/Chest: Effort normal and breath sounds normal. No respiratory distress. She has no wheezes. She has no rales.  Abdominal: Soft. She exhibits no distension. There is no tenderness.  Musculoskeletal:       Back:  TTP as depicted in image. No obvious deformity. No erythema, edema, or ecchymosis. The patient has normal sensation and motor function in the median, ulnar, and radial nerve distributions. 2+ Radial pulse. (-) empty can test, (-) neer's, (-) lift off Tingling sensation of right arm not re-producible on exam.   Neurological: She is alert and oriented to person, place, and time.  Skin: Skin is warm and dry. She is not diaphoretic.  Psychiatric: She has a normal mood and affect.  Nursing note and vitals reviewed.   ED Course  Procedures (including critical care time)  DIAGNOSTIC STUDIES: Oxygen Saturation is 100% on room air, normal by my interpretation.    COORDINATION OF CARE: 12:40 PM Discussed treatment plan with pt at bedside and pt agreed to plan.   Labs Review Labs Reviewed - No data to display  Imaging Review Dg Cervical Spine Complete  07/21/2015  CLINICAL DATA:  Pain radiates to right side neck for 4 days. Back pain. No known injury. EXAM: CERVICAL SPINE - COMPLETE 4+ VIEW COMPARISON:  None. FINDINGS: There is no evidence of cervical spine fracture or prevertebral soft tissue swelling. Alignment is normal. No other significant bone abnormalities are identified. IMPRESSION: Negative cervical spine radiographs. Electronically Signed   By: Charlett Nose M.D.   On: 07/21/2015 13:43   Dg Thoracic Spine 2 View  07/21/2015  CLINICAL DATA:   Intermittent right medial scapular pain. EXAM: THORACIC SPINE 2 VIEWS COMPARISON:  None. FINDINGS: There is no evidence of thoracic spine fracture. Alignment is normal. No other significant bone abnormalities are identified. IMPRESSION: Negative. Electronically Signed   By: Kennith Center M.D.   On: 07/21/2015 13:44   I have personally reviewed and evaluated these images and lab results as part of my medical decision-making.   EKG Interpretation None      MDM   Final diagnoses:  Cervical strain, initial encounter  Shoulder pain, acute, right   Alicia Parks presents with right shoulder/neck pain likely musculoskeletal in nature. X-rays of c-spine and thoracic spine were obtained which were unremarkable. Patient was given toradol 30 IM and robaxin 500 in ED. Will discharge to home with ibuprofen and robaxin. Ortho follow up given if symptoms do  not improve over the next week. Return precautions and home care instructions given.   I personally performed the services described in this documentation, which was scribed in my presence. The recorded information has been reviewed and is accurate.  [facility] Marveen Donlon, PA-C 07/21/15 1427  Tilden Fossa, MD 07/22/15 [phone]

## 2015-07-21 NOTE — ED Notes (Signed)
Patient here with complaints of shoulder pain radiating up into neck. States that she has tried hot packs and ibuprofen with no relief. Pain 10/10.

## 2016-04-09 ENCOUNTER — Encounter (HOSPITAL_COMMUNITY): Payer: Self-pay | Admitting: Family Medicine

## 2016-04-09 ENCOUNTER — Emergency Department (HOSPITAL_COMMUNITY)
Admission: EM | Admit: 2016-04-09 | Discharge: 2016-04-09 | Disposition: A | Discharge status: Home / Self Care | Payer: Self-pay | Attending: Dermatology | Admitting: Dermatology

## 2016-04-09 DIAGNOSIS — R1031 Right lower quadrant pain: Secondary | ICD-10-CM | POA: Insufficient documentation

## 2016-04-09 DIAGNOSIS — Z5321 Procedure and treatment not carried out due to patient leaving prior to being seen by health care provider: Secondary | ICD-10-CM | POA: Insufficient documentation

## 2016-04-09 DIAGNOSIS — Z79899 Other long term (current) drug therapy: Secondary | ICD-10-CM | POA: Insufficient documentation

## 2016-04-09 DIAGNOSIS — M545 Low back pain: Secondary | ICD-10-CM | POA: Insufficient documentation

## 2016-04-09 LAB — URINALYSIS, ROUTINE W REFLEX MICROSCOPIC
Bilirubin Urine: NEGATIVE
GLUCOSE, UA: NEGATIVE mg/dL
KETONES UR: NEGATIVE mg/dL
Nitrite: POSITIVE — AB
PH: 6 (ref 5.0–8.0)
Protein, ur: 100 mg/dL — AB
SPECIFIC GRAVITY, URINE: 1.013 (ref 1.005–1.030)

## 2016-04-09 LAB — URINE MICROSCOPIC-ADD ON

## 2016-04-09 LAB — POC URINE PREG, ED: Preg Test, Ur: NEGATIVE

## 2016-04-09 NOTE — ED Notes (Signed)
Patient called for assigned room with no answer.  

## 2016-04-09 NOTE — ED Notes (Signed)
Attempted to call patient back for assigned room, no answer.

## 2016-04-09 NOTE — ED Triage Notes (Signed)
Patient reports she is experiencing lower back pain that radiates to her right lower abd. Also, reports increase in urgency and frequency in urination and burning with urination. Symptoms started yesterday.

## 2016-10-14 ENCOUNTER — Ambulatory Visit: Payer: Self-pay | Admitting: Family Medicine

## 2016-10-15 ENCOUNTER — Ambulatory Visit (INDEPENDENT_AMBULATORY_CARE_PROVIDER_SITE_OTHER): Payer: Self-pay | Admitting: Family Medicine

## 2016-10-15 ENCOUNTER — Encounter: Payer: Self-pay | Admitting: Family Medicine

## 2016-10-15 VITALS — BP 116/78 | HR 80 | Temp 97.8°F | Resp 18 | Ht 63.0 in | Wt 206.0 lb

## 2016-10-15 DIAGNOSIS — F419 Anxiety disorder, unspecified: Secondary | ICD-10-CM

## 2016-10-15 DIAGNOSIS — Z0000 Encounter for general adult medical examination without abnormal findings: Secondary | ICD-10-CM

## 2016-10-15 DIAGNOSIS — Z131 Encounter for screening for diabetes mellitus: Secondary | ICD-10-CM

## 2016-10-15 DIAGNOSIS — F329 Major depressive disorder, single episode, unspecified: Secondary | ICD-10-CM

## 2016-10-15 LAB — POCT URINALYSIS DIP (DEVICE)
Bilirubin Urine: NEGATIVE
Glucose, UA: NEGATIVE mg/dL
Hgb urine dipstick: NEGATIVE
Ketones, ur: NEGATIVE mg/dL
LEUKOCYTES UA: NEGATIVE
NITRITE: NEGATIVE
Protein, ur: NEGATIVE mg/dL
UROBILINOGEN UA: 0.2 mg/dL (ref 0.0–1.0)
pH: 6 (ref 5.0–8.0)

## 2016-10-15 LAB — LIPID PANEL
Cholesterol: 194 mg/dL (ref <200)
HDL: 48 mg/dL — ABNORMAL LOW (ref >50)
LDL CALC: 124 mg/dL — AB (ref <100)
Total CHOL/HDL Ratio: 4 Ratio (ref <5.0)
Triglycerides: 112 mg/dL (ref <150)
VLDL: 22 mg/dL (ref <30)

## 2016-10-15 LAB — COMPLETE METABOLIC PANEL WITH GFR
AG RATIO: 1.5 ratio (ref 1.0–2.5)
ALBUMIN: 4.6 g/dL (ref 3.6–5.1)
ALT: 23 U/L (ref 6–29)
AST: 18 U/L (ref 10–30)
Alkaline Phosphatase: 61 U/L (ref 33–115)
BUN / CREAT RATIO: 12 ratio (ref 6–22)
BUN: 10 mg/dL (ref 7–25)
CO2: 25 mmol/L (ref 20–31)
CREATININE: 0.83 mg/dL (ref 0.50–1.10)
Calcium: 9.8 mg/dL (ref 8.6–10.2)
Chloride: 104 mmol/L (ref 98–110)
GFR, Est African American: >89 mL/min (ref >=60)
GFR, Est Non African American: 88 mL/min (ref >=60)
Globulin: 3 g/dL (ref 1.9–3.7)
Glucose, Bld: 93 mg/dL (ref 65–99)
Potassium: 4.5 mmol/L (ref 3.5–5.3)
Sodium: 140 mmol/L (ref 135–146)
Total Bilirubin: 0.7 mg/dL (ref 0.2–1.2)
Total Protein: 7.6 g/dL (ref 6.1–8.1)

## 2016-10-15 LAB — CBC WITH DIFFERENTIAL/PLATELET
BASOS ABS: 0 {cells}/uL (ref 0–200)
Basophils Relative: 0 %
EOS ABS: 105 {cells}/uL (ref 15–500)
Eosinophils Relative: 1 %
HEMATOCRIT: 42.7 % (ref 35.0–45.0)
Hemoglobin: 14.5 g/dL (ref 11.7–15.5)
LYMPHS PCT: 35 %
Lymphs Abs: 3675 cells/uL (ref 850–3900)
MCH: 30.9 pg (ref 27.0–33.0)
MCHC: 34 g/dL (ref 32.0–36.0)
MCV: 90.9 fL (ref 80.0–100.0)
MONO ABS: 525 {cells}/uL (ref 200–950)
MPV: 9.4 fL (ref 7.5–12.5)
Monocytes Relative: 5 %
Neutro Abs: 6195 cells/uL (ref 1500–7800)
Neutrophils Relative %: 59 %
Platelets: 443 10*3/uL — ABNORMAL HIGH (ref 140–400)
RBC: 4.7 MIL/uL (ref 3.80–5.10)
RDW: 13.7 % (ref 11.0–15.0)
WBC: 10.5 10*3/uL (ref 3.8–10.8)

## 2016-10-15 LAB — POCT URINE PREGNANCY: Preg Test, Ur: NEGATIVE

## 2016-10-15 LAB — THYROID PANEL WITH TSH
FREE THYROXINE INDEX: 2.4 (ref 1.4–3.8)
T3 Uptake: 29 % (ref 22–35)
T4 TOTAL: 8.2 ug/dL (ref 4.5–12.0)
TSH: 1.56 mIU/L

## 2016-10-15 MED ORDER — VENLAFAXINE HCL ER 75 MG PO CP24: ORAL_CAPSULE | ORAL | 0 refills | Status: DC

## 2016-10-15 MED FILL — VENLAFAXINE HCL ER 75 MG CA: 75 | 30 days supply | Qty: 53 | Fill #0

## 2016-10-15 NOTE — Patient Instructions (Signed)
I have prescribed you Effexor for depression and anxiety.  Take medication as follows:  Take 1 tablet for 7 days and then increase to 2 tablets daily.  Return for care in 2 weeks for a PAP, evaluate depression/anxiety and to review labs.   Venlafaxine tablets What is this medicine? VENLAFAXINE (VEN la fax een) is used to treat depression, anxiety and panic disorder. This medicine may be used for other purposes; ask your health care provider or pharmacist if you have questions. COMMON BRAND NAME(S): Effexor What should I tell my health care provider before I take this medicine? They need to know if you have any of these conditions: -bleeding disorders -glaucoma -heart disease -high blood pressure -high cholesterol -kidney disease -liver disease -low levels of sodium in the blood -mania or bipolar disorder -seizures -suicidal thoughts, plans, or attempt; a previous suicide attempt by you or a family -take medicines that treat or prevent blood clots -thyroid disease -an unusual or allergic reaction to venlafaxine, desvenlafaxine, other medicines, foods, dyes, or preservatives -pregnant or trying to get pregnant -breast-feeding How should I use this medicine? Take this medicine by mouth with a glass of water. Follow the directions on the prescription label. Take it with food. Take your medicine at regular intervals. Do not take your medicine more often than directed. Do not stop taking this medicine suddenly except upon the advice of your doctor. Stopping this medicine too quickly may cause serious side effects or your condition may worsen. A special MedGuide will be given to you by the pharmacist with each prescription and refill. Be sure to read this information carefully each time. Talk to your pediatrician regarding the use of this medicine in children. Special care may be needed. Overdosage: If you think you have taken too much of this medicine contact a poison control center or  emergency room at once. NOTE: This medicine is only for you. Do not share this medicine with others. What if I miss a dose? If you miss a dose, take it as soon as you can. If it is almost time for your next dose, take only that dose. Do not take double or extra doses. What may interact with this medicine? Do not take this medicine with any of the following medications: -certain medicines for fungal infections like fluconazole, itraconazole, ketoconazole, posaconazole, voriconazole -cisapride -desvenlafaxine -dofetilide -dronedarone -duloxetine -levomilnacipran -linezolid -MAOIs like Carbex, Eldepryl, Marplan, Nardil, and Parnate -methylene blue (injected into a vein) -milnacipran -pimozide -thioridazine -ziprasidone This medicine may also interact with the following medications: -amphetamines -aspirin and aspirin-like medicines -certain medicines for depression, anxiety, or psychotic disturbances -certain medicines for migraine headaches like almotriptan, eletriptan, frovatriptan, naratriptan, rizatriptan, sumatriptan, zolmitriptan -certain medicines for sleep -certain medicines that treat or prevent blood clots like dalteparin, enoxaparin, warfarin -cimetidine -clozapine -diuretics -fentanyl -furazolidone -indinavir -isoniazid -lithium -metoprolol -NSAIDS, medicines for pain and inflammation, like ibuprofen or naproxen -other medicines that prolong the QT interval (cause an abnormal heart rhythm) -procarbazine -rasagiline -supplements like St. John's wort, kava kava, valerian -tramadol -tryptophan This list may not describe all possible interactions. Give your health care provider a list of all the medicines, herbs, non-prescription drugs, or dietary supplements you use. Also tell them if you smoke, drink alcohol, or use illegal drugs. Some items may interact with your medicine. What should I watch for while using this medicine? Tell your doctor if your symptoms do not  get better or if they get worse. Visit your doctor or health care professional for regular checks  on your progress. Because it may take several weeks to see the full effects of this medicine, it is important to continue your treatment as prescribed by your doctor. Patients and their families should watch out for new or worsening thoughts of suicide or depression. Also watch out for sudden changes in feelings such as feeling anxious, agitated, panicky, irritable, hostile, aggressive, impulsive, severely restless, overly excited and hyperactive, or not being able to sleep. If this happens, especially at the beginning of treatment or after a change in dose, call your health care professional. This medicine can cause an increase in blood pressure. Check with your doctor for instructions on monitoring your blood pressure while taking this medicine. You may get drowsy or dizzy. Do not drive, use machinery, or do anything that needs mental alertness until you know how this medicine affects you. Do not stand or sit up quickly, especially if you are an older patient. This reduces the risk of dizzy or fainting spells. Alcohol may interfere with the effect of this medicine. Avoid alcoholic drinks. Your mouth may get dry. Chewing sugarless gum, sucking hard candy and drinking plenty of water will help. Contact your doctor if the problem does not go away or is severe. What side effects may I notice from receiving this medicine? Side effects that you should report to your doctor or health care professional as soon as possible: -allergic reactions like skin rash, itching or hives, swelling of the face, lips, or tongue -anxious -breathing problems -confusion -changes in vision -chest pain -confusion -elevated mood, decreased need for sleep, racing thoughts, impulsive behavior -eye pain -fast, irregular heartbeat -feeling faint or lightheaded, falls -feeling agitated, angry, or irritable -hallucination, loss of  contact with reality -high blood pressure -loss of balance or coordination -palpitations -redness, blistering, peeling or loosening of the skin, including inside the mouth -restlessness, pacing, inability to keep still -seizures -stiff muscles -suicidal thoughts or other mood changes -trouble passing urine or change in the amount of urine -trouble sleeping -unusual bleeding or bruising -unusually weak or tired -vomiting Side effects that usually do not require medical attention (report to your doctor or health care professional if they continue or are bothersome): -change in sex drive or performance -change in appetite or weight -constipation -dizziness -dry mouth -headache -increased sweating -nausea -tired This list may not describe all possible side effects. Call your doctor for medical advice about side effects. You may report side effects to FDA at 1-800-FDA-1088. Where should I keep my medicine? Keep out of the reach of children. Store at a controlled temperature between 20 and 25 degrees C (68 and 77 degrees F), in a dry place. Throw away any unused medicine after the expiration date. NOTE: This sheet is a summary. It may not cover all possible information. If you have questions about this medicine, talk to your doctor, pharmacist, or health care provider.  2018 Elsevier/Gold Standard (2015-11-08 18:42:26)  Generalized Anxiety Disorder, Adult Generalized anxiety disorder (GAD) is a mental health disorder. People with this condition constantly worry about everyday events. Unlike normal anxiety, worry related to GAD is not triggered by a specific event. These worries also do not fade or get better with time. GAD interferes with life functions, including relationships, work, and school. GAD can vary from mild to severe. People with severe GAD can have intense waves of anxiety with physical symptoms (panic attacks). What are the causes? The exact cause of GAD is not  known. What increases the risk? This condition is more  likely to develop in:  Women.  People who have a family history of anxiety disorders.  People who are very shy.  People who experience very stressful life events, such as the death of a loved one.  People who have a very stressful family environment. What are the signs or symptoms? People with GAD often worry excessively about many things in their lives, such as their health and family. They may also be overly concerned about:  Doing well at work.  Being on time.  Natural disasters.  Friendships. Physical symptoms of GAD include:  Fatigue.  Muscle tension or having muscle twitches.  Trembling or feeling shaky.  Being easily startled.  Feeling like your heart is pounding or racing.  Feeling out of breath or like you cannot take a deep breath.  Having trouble falling asleep or staying asleep.  Sweating.  Nausea, diarrhea, or irritable bowel syndrome (IBS).  Headaches.  Trouble concentrating or remembering facts.  Restlessness.  Irritability. How is this diagnosed? Your health care provider can diagnose GAD based on your symptoms and medical history. You will also have a physical exam. The health care provider will ask specific questions about your symptoms, including how severe they are, when they started, and if they come and go. Your health care provider may ask you about your use of alcohol or drugs, including prescription medicines. Your health care provider may refer you to a mental health specialist for further evaluation. Your health care provider will do a thorough examination and may perform additional tests to rule out other possible causes of your symptoms. To be diagnosed with GAD, a person must have anxiety that:  Is out of his or her control.  Affects several different aspects of his or her life, such as work and relationships.  Causes distress that makes him or her unable to take part in  normal activities.  Includes at least three physical symptoms of GAD, such as restlessness, fatigue, trouble concentrating, irritability, muscle tension, or sleep problems. Before your health care provider can confirm a diagnosis of GAD, these symptoms must be present more days than they are not, and they must last for six months or longer. How is this treated? The following therapies are usually used to treat GAD:  Medicine. Antidepressant medicine is usually prescribed for long-term daily control. Antianxiety medicines may be added in severe cases, especially when panic attacks occur.  Talk therapy (psychotherapy). Certain types of talk therapy can be helpful in treating GAD by providing support, education, and guidance. Options include:  Cognitive behavioral therapy (CBT). People learn coping skills and techniques to ease their anxiety. They learn to identify unrealistic or negative thoughts and behaviors and to replace them with positive ones.  Acceptance and commitment therapy (ACT). This treatment teaches people how to be mindful as a way to cope with unwanted thoughts and feelings.  Biofeedback. This process trains you to manage your body's response (physiological response) through breathing techniques and relaxation methods. You will work with a therapist while machines are used to monitor your physical symptoms.  Stress management techniques. These include yoga, meditation, and exercise. A mental health specialist can help determine which treatment is best for you. Some people see improvement with one type of therapy. However, other people require a combination of therapies. Follow these instructions at home:  Take over-the-counter and prescription medicines only as told by your health care provider.  Try to maintain a normal routine.  Try to anticipate stressful situations and allow extra time  to manage them.  Practice any stress management or self-calming techniques as taught by  your health care provider.  Do not punish yourself for setbacks or for not making progress.  Try to recognize your accomplishments, even if they are small.  Keep all follow-up visits as told by your health care provider. This is important. Contact a health care provider if:  Your symptoms do not get better.  Your symptoms get worse.  You have signs of depression, such as:  A persistently sad, cranky, or irritable mood.  Loss of enjoyment in activities that used to bring you joy.  Change in weight or eating.  Changes in sleeping habits.  Avoiding friends or family members.  Loss of energy for normal tasks.  Feelings of guilt or worthlessness. Get help right away if:  You have serious thoughts about hurting yourself or others. If you ever feel like you may hurt yourself or others, or have thoughts about taking your own life, get help right away. You can go to your nearest emergency department or call:  Your local emergency services (911 in the [place]  A suicide crisis helpline, such as the National Suicide Prevention Lifeline at (808)122-8980. This is open 24 hours a day. Summary  Generalized anxiety disorder (GAD) is a mental health disorder that involves worry that is not triggered by a specific event.  People with GAD often worry excessively about many things in their lives, such as their health and family.  GAD may cause physical symptoms such as restlessness, trouble concentrating, sleep problems, frequent sweating, nausea, diarrhea, headaches, and trembling or muscle twitching.  A mental health specialist can help determine which treatment is best for you. Some people see improvement with one type of therapy. However, other people require a combination of therapies. This information is not intended to replace advice given to you by your health care provider. Make sure you discuss any questions you have with your health care provider. Document Released: 10/04/2012  Document Revised: 04/29/2016 Document Reviewed: 04/29/2016 Elsevier Interactive Patient Education  2017 Elsevier Inc.  Major Depressive Disorder, Adult Major depressive disorder (MDD) is a mental health condition. MDD often makes you feel sad, hopeless, or helpless. MDD can also cause symptoms in your body. MDD can affect your:  Work.  School.  Relationships.  Other normal activities. MDD can range from mild to very bad. It may occur once (single episode MDD). It can also occur many times (recurrent MDD). The main symptoms of MDD often include:  Feeling sad, depressed, or irritable most of the time.  Loss of interest. MDD symptoms also include:  Sleeping too much or too little.  Eating too much or too little.  A change in your weight.  Feeling tired (fatigue) or having low energy.  Feeling worthless.  Feeling guilty.  Trouble making decisions.  Trouble thinking clearly.  Thoughts of suicide or harming others.  Feeling weak.  Feeling agitated.  Keeping yourself from being around other people (isolation). Follow these instructions at home: Activity   Do these things as told by your doctor:  Go back to your normal activities.  Exercise regularly.  Spend time outdoors. Alcohol   Talk with your doctor about how alcohol can affect your antidepressant medicines.  Do not drink alcohol. Or, limit how much alcohol you drink.  This means no more than 1 drink a day for nonpregnant women and 2 drinks a day for men. One drink equals one of these:  12 oz of beer.  5  oz of wine.  1 oz of hard liquor. General instructions   Take over-the-counter and prescription medicines only as told by your doctor.  Eat a healthy diet.  Get plenty of sleep.  Find activities that you enjoy. Make time to do them.  Think about joining a support group. Your doctor may be able to suggest a group for you.  Keep all follow-up visits as told by your doctor. This is  important. Where to find more information:   The [employer] on Mental Illness:  www.nami.org  [place] [employer] of Mental Health:  http://www.maynard.net/  National Suicide Prevention Lifeline:  813-671-6413. This is free, 24-hour help. Contact a doctor if:  Your symptoms get worse.  You have new symptoms. Get help right away if:  You self-harm.  You see, hear, taste, smell, or feel things that are not present (hallucinate). If you ever feel like you may hurt yourself or others, or have thoughts about taking your own life, get help right away. You can go to your nearest emergency department or call:  Your local emergency services (911 in the [place]  A suicide crisis helpline, such as the National Suicide Prevention Lifeline:  224 026 7653. This is open 24 hours a day. This information is not intended to replace advice given to you by your health care provider. Make sure you discuss any questions you have with your health care provider. Document Released: 05/21/2015 Document Revised: 02/24/2016 Document Reviewed: 02/24/2016 Elsevier Interactive Patient Education  2017 [employer].

## 2016-10-15 NOTE — Progress Notes (Signed)
Patient ID: Alicia Parks, female    DOB: September 09, 1975, 41 y.o.   MRN: [id number]  PCP: Joaquin Courts, FNP  Chief Complaint  Patient presents with  . Establish Care  . Nausea  . Depression    Subjective:  HPI  Alicia Parks is a 41 y.o. female presents to establish care and evaluate depression and anxiety.  Medical history significant for depression, anxiety, hx of substance abuse.  Reports it has been a significant period of time since she has seen a primary healthcare provider and therefore she has utilized the emergency department for . Feels in general her physical symptoms of nausea, abdominal bloating, and occasional headaches are related to stress and anxiety.  She is unemployed, married, and resides with her husband at a rooming house. Reports a hx of homelessness and substance abuse, insists she is not using drugs or alcohol presently.  Depression since childhood. Hx of ADHD with medication treatment. No medication treatment for depression throughout teen years, although she went through counseling found it non-helpful. After she had her children, the depression and anxiety became very worst which she managed by consuming alcohol and engaging in illegal drug use. Reports that she no longer does drugs. Concerned now as she feels as anxiety and depression are both affecting her physically. Feels bloated all the time, nauseated without vomiting, has headaches, and people are always telling her she looks "angry". Major stressors in life right now, is her oldest son has been trouble to point his life was threaten has she had to move him out of state. She and husband argue often although she denies physical violence. She has been unable to keep a job over the last several years which makes her feel very "down" on herself. Unable to keep employment because inability to focus and depression. She doesn't like being around a lot of people. At present she and husband live in a rooming house  and she basically stays in her room all day to avoid being around other people.   Social History   Social History  . Marital status: Divorced    Spouse name: N/A  . Number of children: N/A  . Years of education: N/A   Occupational History  . Not on file.   Social History Main Topics  . Smoking status: Never Smoker  . Smokeless tobacco: Never Used  . Alcohol use No  . Drug use: No  . Sexual activity: Yes    Birth control/ protection: Surgical   Other Topics Concern  . Not on file   Social History Narrative  . No narrative on file    Family History  Problem Relation Age of Onset  . Hypertension Father   . Heart disease Father    Review of Systems See HPI   Allergies  Allergen Reactions  . Compazine [Prochlorperazine Edisylate] Other (See Comments)    Makes body move funny. Shaking    Prior to Admission medications   Medication Sig Start Date End Date Taking? Authorizing Provider  Multiple Vitamin (MULTIVITAMIN) tablet Take 1 tablet by mouth daily.   Yes Historical Provider, MD  Omega-3 Fatty Acids (FISH OIL PO) Take 1 capsule by mouth daily.   Yes Historical Provider, MD  Melatonin 5 MG CAPS Take 5 mg by mouth every other day.     Historical Provider, MD  meloxicam (MOBIC) 15 MG tablet Take 0.5 tablets (7.5 mg total) by mouth daily. Take with food. You may increase the dose to 0.5 tab (7.5mg )  twice daily if needed for additional pain relief. Patient not taking: Reported on 10/15/2016 02/04/14   Mercedes Street, PA-C  methocarbamol (ROBAXIN) 500 MG tablet Take 1 tablet (500 mg total) by mouth 2 (two) times daily. Patient not taking: Reported on 10/15/2016 07/21/15   [facility], PA-C  naproxen (NAPROSYN) 500 MG tablet Take 1 tablet (500 mg total) by mouth 2 (two) times daily. Patient not taking: Reported on 10/15/2016 12/27/14   Antony Madura, PA-C  oxyCODONE-acetaminophen (PERCOCET) 5-325 MG per tablet Take 1-2 tablets by mouth every 6 (six) hours as needed for  severe pain. Patient not taking: Reported on 10/15/2016 12/27/14   Antony Madura, PA-C    Past Medical, Surgical Family and Social History reviewed and updated.    Objective:   Today's Vitals   10/15/16 1001  BP: 116/78  Pulse: 80  Resp: 18  Temp: 97.8 F (36.6 C)  TempSrc: Oral  SpO2: 100%  Weight: 206 lb (93.4 kg)  Height:  (1.6 m)    Wt Readings from Last 3 Encounters:  10/15/16 206 lb (93.4 kg)  04/09/16 180 lb (81.6 kg)  12/27/14 193 lb (87.5 kg)   Physical Exam  Constitutional: She is oriented to person, place, and time. She appears well-developed and well-nourished.  HENT:  Head: Normocephalic and atraumatic.  Right Ear: External ear normal.  Left Ear: External ear normal.  Nose: Nose normal.  Mouth/Throat: Oropharynx is clear and moist.  Eyes: Conjunctivae and EOM are normal. Pupils are equal, round, and reactive to light.  Neck: Normal range of motion. Neck supple.  Cardiovascular: Normal rate, regular rhythm, normal heart sounds and intact distal pulses.   Pulmonary/Chest: Effort normal and breath sounds normal.  Musculoskeletal: Normal range of motion.  Neurological: She is alert and oriented to person, place, and time.  Skin: Skin is warm and dry.  Psychiatric: She has a normal mood and affect. Her behavior is normal. Thought content normal.      Assessment & Plan:  1. Wellness examination - Age appropriate anticipatory guidance provided   2. Anxiety and depression -Venlafaxine XR 75 x 1 week, increase dose 150 mg after 7 days.  3. Screening for diabetes mellitus - Hemoglobin A1c   RTC: 2 Weeks PAP, Baseline EKG, Mammogram, f/u depression/anxiety,  any other outstanding health maintenance items   Godfrey Pick. Tiburcio Pea, MSN, [facility] [address] [place], [place] [address] [phone]

## 2016-10-16 LAB — HEMOGLOBIN A1C
Hgb A1c MFr Bld: 5.1 % (ref <5.7)
Mean Plasma Glucose: 100 mg/dL

## 2016-10-20 ENCOUNTER — Ambulatory Visit: Payer: Self-pay

## 2016-10-29 ENCOUNTER — Ambulatory Visit: Payer: Self-pay

## 2016-10-29 ENCOUNTER — Ambulatory Visit: Payer: Self-pay | Admitting: Family Medicine

## 2016-10-31 ENCOUNTER — Ambulatory Visit: Payer: Self-pay | Admitting: Family Medicine

## 2016-11-12 ENCOUNTER — Ambulatory Visit: Payer: Self-pay | Admitting: Family Medicine

## 2016-11-14 ENCOUNTER — Ambulatory Visit: Payer: Self-pay | Admitting: Family Medicine

## 2016-11-21 ENCOUNTER — Emergency Department (HOSPITAL_COMMUNITY)
Admission: EM | Admit: 2016-11-21 | Discharge: 2016-11-21 | Disposition: A | Discharge status: Home / Self Care | Payer: Self-pay | Attending: Emergency Medicine | Admitting: Emergency Medicine

## 2016-11-21 ENCOUNTER — Encounter (HOSPITAL_COMMUNITY): Payer: Self-pay | Admitting: Emergency Medicine

## 2016-11-21 DIAGNOSIS — R1011 Right upper quadrant pain: Secondary | ICD-10-CM | POA: Insufficient documentation

## 2016-11-21 DIAGNOSIS — R110 Nausea: Secondary | ICD-10-CM | POA: Insufficient documentation

## 2016-11-21 DIAGNOSIS — Z5321 Procedure and treatment not carried out due to patient leaving prior to being seen by health care provider: Secondary | ICD-10-CM | POA: Insufficient documentation

## 2016-11-21 DIAGNOSIS — Z79899 Other long term (current) drug therapy: Secondary | ICD-10-CM | POA: Insufficient documentation

## 2016-11-21 HISTORY — DX: Gastro-esophageal reflux disease without esophagitis: K21.9

## 2016-11-21 NOTE — ED Notes (Addendum)
PT CALLED FROM LOBBY FOR BLOOD DRAW WITH NO RESPONSE.  RN NOTIFIED.

## 2016-11-21 NOTE — ED Triage Notes (Signed)
Pt states she has pain in her right upper quadrant that started weeks ago  Pt states when she walks the area tightens up  Pt states she has acid reflux and it has been worse lately  Pt states she has had nausea as well

## 2016-11-21 NOTE — ED Notes (Signed)
Bed: [facility] Expected date:  Expected time:  Means of arrival:  Comments: 

## 2016-11-21 NOTE — ED Notes (Signed)
Called Pt for vital recheck and to obtain about urine sample, no response, do not see Pt in lobby.

## 2016-12-01 ENCOUNTER — Ambulatory Visit: Payer: Self-pay

## 2016-12-03 ENCOUNTER — Encounter (HOSPITAL_COMMUNITY): Payer: Self-pay | Admitting: Emergency Medicine

## 2016-12-03 DIAGNOSIS — Y929 Unspecified place or not applicable: Secondary | ICD-10-CM | POA: Insufficient documentation

## 2016-12-03 DIAGNOSIS — Y999 Unspecified external cause status: Secondary | ICD-10-CM | POA: Insufficient documentation

## 2016-12-03 DIAGNOSIS — Z79899 Other long term (current) drug therapy: Secondary | ICD-10-CM | POA: Insufficient documentation

## 2016-12-03 DIAGNOSIS — X501XXA Overexertion from prolonged static or awkward postures, initial encounter: Secondary | ICD-10-CM | POA: Insufficient documentation

## 2016-12-03 DIAGNOSIS — Y939 Activity, unspecified: Secondary | ICD-10-CM | POA: Insufficient documentation

## 2016-12-03 DIAGNOSIS — S29011A Strain of muscle and tendon of front wall of thorax, initial encounter: Secondary | ICD-10-CM | POA: Insufficient documentation

## 2016-12-03 NOTE — ED Triage Notes (Signed)
Pt states she has pain in her right upper quadrant that started a few weeks ago  Pt states the pain radiates into her back  Pt states she feels like she has a knot in her side and that area feels swollen  Pt states walking makes the pain worse  Pt states she has nausea every morning

## 2016-12-04 ENCOUNTER — Emergency Department (HOSPITAL_COMMUNITY): Payer: Self-pay

## 2016-12-04 ENCOUNTER — Emergency Department (HOSPITAL_COMMUNITY)
Admission: EM | Admit: 2016-12-04 | Discharge: 2016-12-04 | Disposition: A | Discharge status: Home / Self Care | Payer: Self-pay | Attending: Emergency Medicine | Admitting: Emergency Medicine

## 2016-12-04 DIAGNOSIS — S29011A Strain of muscle and tendon of front wall of thorax, initial encounter: Secondary | ICD-10-CM

## 2016-12-04 LAB — COMPREHENSIVE METABOLIC PANEL WITH GFR
ALT: 26 U/L (ref 14–54)
AST: 22 U/L (ref 15–41)
Albumin: 4 g/dL (ref 3.5–5.0)
Alkaline Phosphatase: 54 U/L (ref 38–126)
Anion gap: 7 (ref 5–15)
BUN: 11 mg/dL (ref 6–20)
CO2: 28 mmol/L (ref 22–32)
Calcium: 9.4 mg/dL (ref 8.9–10.3)
Chloride: 104 mmol/L (ref 101–111)
Creatinine, Ser: 0.66 mg/dL (ref 0.44–1.00)
GFR calc Af Amer: >60 mL/min (ref >60)
GFR calc non Af Amer: >60 mL/min (ref >60)
Glucose, Bld: 99 mg/dL (ref 65–99)
Potassium: 3.9 mmol/L (ref 3.5–5.1)
Sodium: 139 mmol/L (ref 135–145)
Total Bilirubin: 0.5 mg/dL (ref 0.3–1.2)
Total Protein: 7.4 g/dL (ref 6.5–8.1)

## 2016-12-04 LAB — CBC
HCT: 40.4 % (ref 36.0–46.0)
HEMOGLOBIN: 13.9 g/dL (ref 12.0–15.0)
MCH: 31 pg (ref 26.0–34.0)
MCHC: 34.4 g/dL (ref 30.0–36.0)
MCV: 90 fL (ref 78.0–100.0)
Platelets: 411 10*3/uL — ABNORMAL HIGH (ref 150–400)
RBC: 4.49 MIL/uL (ref 3.87–5.11)
RDW: 13.3 % (ref 11.5–15.5)
WBC: 14.2 10*3/uL — ABNORMAL HIGH (ref 4.0–10.5)

## 2016-12-04 LAB — URINALYSIS, ROUTINE W REFLEX MICROSCOPIC
BILIRUBIN URINE: NEGATIVE
Glucose, UA: NEGATIVE mg/dL
Ketones, ur: NEGATIVE mg/dL
NITRITE: NEGATIVE
PROTEIN: NEGATIVE mg/dL
Specific Gravity, Urine: 1.014 (ref 1.005–1.030)
pH: 6 (ref 5.0–8.0)

## 2016-12-04 LAB — LIPASE, BLOOD: Lipase: 35 U/L (ref 11–51)

## 2016-12-04 LAB — I-STAT BETA HCG BLOOD, ED (MC, WL, AP ONLY): I-stat hCG, quantitative: <5 m[IU]/mL (ref <5)

## 2016-12-04 MED ORDER — NAPROXEN 500 MG PO TABS: 500.0000 mg | ORAL_TABLET | Freq: Two times a day (BID) | ORAL | 0 refills | Status: DC

## 2016-12-04 MED ORDER — LIDOCAINE 5 % EX PTCH
1.0000 | MEDICATED_PATCH | CUTANEOUS | Status: DC
  Administered 2016-12-04: 1 via TRANSDERMAL
  Filled 2016-12-04: qty 1

## 2016-12-04 MED ORDER — LIDOCAINE 5 % EX PTCH: 1.0000 | MEDICATED_PATCH | CUTANEOUS | 0 refills | Status: DC

## 2016-12-04 NOTE — ED Provider Notes (Signed)
[facility]-EMERGENCY DEPT Provider Note   CSN: [id number] Arrival date & time: 12/03/16  2247    History   Chief Complaint Chief Complaint  Patient presents with  . Abdominal Pain    HPI Alicia Parks is a 41 y.o. female.  41 year old female with a history of fibromyalgia, esophageal reflux, migraine headaches, and restless leg syndrome presents to the emergency department for evaluation of right sided lower chest and upper abdominal pain. She states that she has noticed a "tightness and swelling" in her right upper quadrant. She states that this is intermittent and worse with walking as well as when supine. Patient has had symptoms over the past month. She has tried heat packs, cold packs, Epsom salt soaks, Advil, and Aleve without relief. She denies any known alleviating factors of her symptoms. Associated fevers, bowel changes, urinary symptoms. No diarrhea, melena, hematochezia. No vomiting. Patient is currently on her menstrual cycle. She has an abdominal surgical history significant for tubal ligation.   The history is provided by the patient. No language interpreter was used.  Abdominal Pain      Past Medical History:  Diagnosis Date  . Back pain   . Complication of anesthesia   . Ear drum perforation   . Fibromyalgia   . GERD (gastroesophageal reflux disease)   . Migraine   . Restless leg syndrome     There are no active problems to display for this patient.   Past Surgical History:  Procedure Laterality Date  . TONSILLECTOMY    . TUBAL LIGATION      OB History    Gravida Para Term Preterm AB Living   3 3 3  0 0 3   SAB TAB Ectopic Multiple Live Births   0 0 0 0         Home Medications    Prior to Admission medications   Medication Sig Start Date End Date Taking? Authorizing Provider  ibuprofen (ADVIL,MOTRIN) 200 MG tablet Take 400 mg by mouth every 6 (six) hours as needed for moderate pain.   Yes [provider]  Multiple Vitamin (MULTIVITAMIN)  tablet Take 1 tablet by mouth daily.   Yes [provider]  lidocaine (LIDODERM) 5 % Place 1 patch onto the skin daily. Remove & Discard patch within 12 hours or as directed by MD 12/04/16   Antony MaduraHumes, Mckinnon Glick, PA-C  naproxen (NAPROSYN) 500 MG tablet Take 1 tablet (500 mg total) by mouth 2 (two) times daily. 12/04/16   Antony MaduraHumes, Hailee Hollick, PA-C  venlafaxine XR (EFFEXOR-XR) 75 MG 24 hr capsule Take 1 tablet for 7 days. Then increase dose to 2 tablets daily Patient not taking: Reported on 12/04/2016 10/15/16   Bing NeighborsHarris, Kimberly S, FNP    Family History Family History  Problem Relation Age of Onset  . Hypertension Father   . Heart disease Father   . Gout Father   . Osteoporosis Mother   . Cancer Other     Social History Social History  Substance Use Topics  . Smoking status: Never Smoker  . Smokeless tobacco: Never Used  . Alcohol use No     Allergies   Compazine [prochlorperazine edisylate]   Review of Systems Review of Systems  Gastrointestinal: Positive for abdominal pain.  Ten systems reviewed and are negative for acute change, except as noted in the HPI.    Physical Exam Updated Vital Signs BP 106/63 (BP Location: Left Arm)   Pulse 78   Temp 98.1 F (36.7 C) (Oral)   Resp  20   LMP 12/01/2016   SpO2 100%   Physical Exam  Constitutional: She is oriented to person, place, and time. She appears well-developed and well-nourished. No distress.  Nontoxic appearing and in no acute distress  HENT:  Head: Normocephalic and atraumatic.  Eyes: Conjunctivae and EOM are normal. No scleral icterus.  Neck: Normal range of motion.  Cardiovascular: Normal rate, regular rhythm and intact distal pulses.   Pulmonary/Chest: Effort normal. No respiratory distress. She has no wheezes. She has no rales. She exhibits tenderness.    Respirations even and unlabored. Lungs grossly clear. There is tenderness to the right anterior lower chest wall without crepitus or deformity. Question mild  spasm.  Abdominal:  Soft, obese, nontender abdomen. Negative Murphy sign. No tenderness at McBurney's point. No peritoneal signs.  Musculoskeletal: Normal range of motion.  Neurological: She is alert and oriented to person, place, and time. She exhibits normal muscle tone. Coordination normal.  Skin: Skin is warm and dry. No rash noted. She is not diaphoretic. No erythema. No pallor.  Psychiatric: She has a normal mood and affect. Her behavior is normal.  Nursing note and vitals reviewed.    ED Treatments / Results  Labs (all labs ordered are listed, but only abnormal results are displayed) Labs Reviewed  CBC - Abnormal; Notable for the following:       Result Value   WBC 14.2 (*)    Platelets 411 (*)    All other components within normal limits  URINALYSIS, ROUTINE W REFLEX MICROSCOPIC - Abnormal; Notable for the following:    APPearance HAZY (*)    Hgb urine dipstick LARGE (*)    Leukocytes, UA TRACE (*)    Bacteria, UA RARE (*)    Squamous Epithelial / LPF 0-5 (*)    All other components within normal limits  LIPASE, BLOOD  COMPREHENSIVE METABOLIC PANEL  I-STAT BETA HCG BLOOD, ED ([facility], [facility], [facility] ONLY)    EKG  EKG Interpretation None       Radiology Dg Ribs Unilateral W/chest Right  Result Date: 12/04/2016 CLINICAL DATA:  Chronic right lower anterior chest pain. Initial encounter. EXAM: RIGHT RIBS AND CHEST - 3+ VIEW COMPARISON:  Chest radiograph performed 07/28/2014 FINDINGS: No displaced fractures are seen. The lungs are well-aerated and clear. There is no evidence of focal opacification, pleural effusion or pneumothorax. The cardiomediastinal silhouette is within normal limits. No acute osseous abnormalities are seen. IMPRESSION: No displaced rib fracture seen. No acute cardiopulmonary process identified. Electronically Signed   By: Roanna Raider M.D.   On: 12/04/2016 02:22    Procedures Procedures (including critical care time)  Medications Ordered in  ED Medications  lidocaine (LIDODERM) 5 % 1 patch (1 patch Transdermal Patch Applied 12/04/16 0211)     Initial Impression / Assessment and Plan / ED Course  I have reviewed the triage vital signs and the nursing notes.  Pertinent labs & imaging results that were available during my care of the patient were reviewed by me and considered in my medical decision making (see chart for details).     41 year old female presents to the emergency department for evaluation of 1 month of right upper abdominal/lower chest pain. Symptoms reproducible on palpation to the chest wall. There is mild spasm noted. Suspect symptoms to be musculoskeletal in etiology. Laboratory workup is reassuring. Patient has had no vomiting or bowel changes. No urinary complaints. No peritoneal signs on exam. Negative Murphy sign. Doubt acute cholecystitis. No fever or leukocytosis to suggest  infectious etiology.  Especially in light of chronicity of symptoms, plan for continued supportive management. Patient has had complete pain relief with a Lidoderm patch. Will prescribe a short course of this medication for her to use. Primary care follow-up advised and return precautions given. Patient discharged in stable condition with no unaddressed concerns.   Final Clinical Impressions(s) / ED Diagnoses   Final diagnoses:  Chest wall muscle strain, initial encounter    New Prescriptions Discharge Medication List as of 12/04/2016  3:04 AM    START taking these medications   Details  lidocaine (LIDODERM) 5 % Place 1 patch onto the skin daily. Remove & Discard patch within 12 hours or as directed by MD, Starting Thu 12/04/2016, Print         Antony Madura, PA-C 12/04/16 0602    Molpus, Jonny Ruiz, MD 12/04/16 1610

## 2016-12-04 NOTE — ED Notes (Signed)
Patient transported to X-ray 

## 2016-12-04 NOTE — Discharge Instructions (Signed)
We recommend that you use a Lidoderm patch as prescribed. Take Naproxen daily for at least 1 week. Apply heat pads to areas of pain 3-4 times per day for 15-20 minutes each time. Follow up with your primary care doctor.

## 2016-12-10 ENCOUNTER — Ambulatory Visit: Payer: Self-pay

## 2016-12-17 ENCOUNTER — Ambulatory Visit (INDEPENDENT_AMBULATORY_CARE_PROVIDER_SITE_OTHER): Payer: Self-pay | Admitting: Family Medicine

## 2016-12-17 ENCOUNTER — Encounter: Payer: Self-pay | Admitting: Family Medicine

## 2016-12-17 VITALS — BP 148/89 | HR 79 | Temp 97.8°F | Resp 14 | Ht 63.0 in | Wt 206.0 lb

## 2016-12-17 DIAGNOSIS — R1031 Right lower quadrant pain: Secondary | ICD-10-CM

## 2016-12-17 DIAGNOSIS — Z1239 Encounter for other screening for malignant neoplasm of breast: Secondary | ICD-10-CM

## 2016-12-17 DIAGNOSIS — Z1231 Encounter for screening mammogram for malignant neoplasm of breast: Secondary | ICD-10-CM

## 2016-12-17 DIAGNOSIS — R1011 Right upper quadrant pain: Secondary | ICD-10-CM

## 2016-12-17 DIAGNOSIS — F39 Unspecified mood [affective] disorder: Secondary | ICD-10-CM

## 2016-12-17 MED ORDER — METHOCARBAMOL 500 MG PO TABS: 500.0000 mg | ORAL_TABLET | Freq: Four times a day (QID) | ORAL | 0 refills | Status: DC

## 2016-12-17 MED FILL — METHOCARBAMOL 500 MG TABLET: 500 | 30 days supply | Qty: 120 | Fill #0

## 2016-12-17 NOTE — Patient Instructions (Addendum)
To schedule your breast exam, please call [facility] at   Phone: [phone]  Reschedule your pap at your convenience.  Complete your application for [facility] financial assistance.   I will go ahead and refer you behavioral health, he will be notified of your scheduled appointment.   I have prescribed Robaxin 500 mg 4 times a day as needed for back pain.  We will contacted regarding scheduling an ultrasound of your right lower abdomen.

## 2016-12-17 NOTE — Progress Notes (Signed)
Patient ID: Alicia MediaLori D Menser, female    DOB: Sep 10, 1975, 41 y.o.   MRN: [id number]  PCP: Bing NeighborsHarris, Lulabelle Desta S, FNP  Chief Complaint  Patient presents with  . Follow-up  . Hospitalization Follow-up    Subjective:  HPI Alicia Parks is a 41 y.o. female presents for a hospital follow-up.  Lawson FiscalLori presented to [facility] 12/04/2016 with a complaint of chest wall pain and muscle spasms in her lower back.  X-ray of right ribs and chest were negative of any acute findings. Lawson FiscalLori was prescribed lidocaine patches and naproxen.  She reports that the medication was ineffective in relieving her symptoms. Lawson FiscalLori reports today that she is most concerned about right sided upper abdominal pain accompanied by the sensation of an abdominal mass in the horizontal location of her lower right quadrant of her abdomen. Reports that the mass is painful intermittently and is associated with movements, lying in bed and or sitting extending periods of time.  Mood Disorder Lawson FiscalLori reports that she stopped taking the venlafaxine previoulsy prescribed for anxiety and depression during her last office visit. She discontinued taking venalfaxine up until about 1 week ago and she felt that medication caused a mood swings. She stopped the medication as her husband told her that she was "different person". Lawson FiscalLori reports that venlafaxine caused her to develop nightmares. Today Lawson FiscalLori  reports she has previously been prescribed Klonopin, Lithium, thorazine, and other "antipsychotic medications". In the past she has been evaluated at [facility] although she is not interested in reestablishing with [facility].   Social History   Social History  . Marital status: Divorced    Spouse name: N/A  . Number of children: N/A  . Years of education: N/A   Occupational History  . Not on file.   Social History Main Topics  . Smoking status: Never Smoker  . Smokeless tobacco: Never Used  . Alcohol use No  . Drug use: Yes    Types:  Marijuana  . Sexual activity: Yes    Birth control/ protection: Surgical   Other Topics Concern  . Not on file   Social History Narrative  . No narrative on file    Family History  Problem Relation Age of Onset  . Hypertension Father   . Heart disease Father   . Gout Father   . Osteoporosis Mother   . Cancer Other    Review of Systems See HPI Allergies  Allergen Reactions  . Compazine [Prochlorperazine Edisylate] Other (See Comments)    Makes body move funny. Shaking    Prior to Admission medications   Medication Sig Start Date End Date Taking? Authorizing Provider  ibuprofen (ADVIL,MOTRIN) 200 MG tablet Take 400 mg by mouth every 6 (six) hours as needed for moderate pain.   Yes [provider]  lidocaine (LIDODERM) 5 % Place 1 patch onto the skin daily. Remove & Discard patch within 12 hours or as directed by MD 12/04/16  Yes Antony MaduraHumes, Kelly, PA-C  Multiple Vitamin (MULTIVITAMIN) tablet Take 1 tablet by mouth daily.   Yes [provider]  naproxen (NAPROSYN) 500 MG tablet Take 1 tablet (500 mg total) by mouth 2 (two) times daily. 12/04/16  Yes Antony MaduraHumes, Kelly, PA-C  venlafaxine XR (EFFEXOR-XR) 75 MG 24 hr capsule Take 1 tablet for 7 days. Then increase dose to 2 tablets daily Patient not taking: Reported on 12/17/2016 10/15/16   Bing NeighborsHarris, Naleyah Ohlinger S, FNP    Past Medical, Surgical Family and Social History reviewed and updated.  Objective:   Today's Vitals   12/17/16 1031  BP: (!) 148/89  Pulse: 79  Resp: 14  Temp: 97.8 F (36.6 C)  TempSrc: Oral  SpO2: 100%  Weight: 206 lb (93.4 kg)  Height: 5\' 3"  (1.6 m)    Wt Readings from Last 3 Encounters:  12/17/16 206 lb (93.4 kg)  10/15/16 206 lb (93.4 kg)  04/09/16 180 lb (81.6 kg)   Physical Exam  Constitutional: She is oriented to person, place, and time. She appears well-developed and well-nourished.  HENT:  Head: Normocephalic and atraumatic.  Eyes: Conjunctivae and EOM are normal. Pupils are equal,  round, and reactive to light.  Neck: Normal range of motion. Neck supple.  Cardiovascular: Normal rate, regular rhythm, normal heart sounds and intact distal pulses.   Pulmonary/Chest: Effort normal and breath sounds normal.  Abdominal: Soft. Bowel sounds are normal. She exhibits no distension and no mass. There is no tenderness. There is no rebound and no guarding.  Non-palpable mass, non-reproducible RUQ tenderness  Neurological: She is alert and oriented to person, place, and time.  Skin: Skin is warm and dry.  Psychiatric: Her behavior is normal. Judgment and thought content normal. Her mood appears anxious.    Assessment & Plan:  1. Screening for breast cancer - MM Digital Screening  2. Mood disorder (HCC) - Ambulatory referral to Psychiatry  3. Right lower quadrant abdominal pain  4. Right upper quadrant abdominal pain, unable to palpate mass or reproduce tenderness.  Will evaluate for the presence of the mass with an US Abdomen Limited RUQ   RTC: Reschedule PAP at next follow-up.  Godfrey Pick. Tiburcio Pea, MSN, FNP-C [facility]  [address] [place], [place] [address] [phone]

## 2016-12-19 ENCOUNTER — Other Ambulatory Visit: Payer: Self-pay | Admitting: Family Medicine

## 2016-12-19 ENCOUNTER — Telehealth: Payer: Self-pay

## 2016-12-19 ENCOUNTER — Ambulatory Visit: Payer: Self-pay

## 2016-12-19 NOTE — Telephone Encounter (Signed)
-----   Message from Kimberly S Harris, FNP sent at 12/17/2016  2:49 PM EDT ----- Please schedule abdominal ultrasound for patient. 

## 2016-12-19 NOTE — Telephone Encounter (Signed)
-----   Message from Bing NeighborsKimberly S Harris, FNP sent at 12/17/2016  2:49 PM EDT ----- Please schedule abdominal ultrasound for patient.

## 2016-12-19 NOTE — Telephone Encounter (Signed)
Got patient scheduled for 12/01/16 at 8am at [facility]. Left patient a detailed vm with appointment information.

## 2016-12-25 ENCOUNTER — Ambulatory Visit: Payer: Self-pay | Admitting: Family Medicine

## 2016-12-31 ENCOUNTER — Ambulatory Visit: Payer: Self-pay

## 2016-12-31 ENCOUNTER — Ambulatory Visit (HOSPITAL_COMMUNITY)
Admission: RE | Admit: 2016-12-31 | Discharge: 2016-12-31 | Disposition: A | Discharge status: Home / Self Care | Payer: Self-pay | Source: Ambulatory Visit | Attending: Family Medicine | Admitting: Family Medicine

## 2016-12-31 DIAGNOSIS — K769 Liver disease, unspecified: Secondary | ICD-10-CM | POA: Insufficient documentation

## 2016-12-31 DIAGNOSIS — K839 Disease of biliary tract, unspecified: Secondary | ICD-10-CM | POA: Insufficient documentation

## 2016-12-31 DIAGNOSIS — R1011 Right upper quadrant pain: Secondary | ICD-10-CM

## 2017-01-02 ENCOUNTER — Telehealth: Payer: Self-pay

## 2017-01-02 ENCOUNTER — Ambulatory Visit: Payer: Self-pay | Admitting: Family Medicine

## 2017-01-04 ENCOUNTER — Other Ambulatory Visit: Payer: Self-pay | Admitting: Family Medicine

## 2017-01-04 DIAGNOSIS — R932 Abnormal findings on diagnostic imaging of liver and biliary tract: Secondary | ICD-10-CM

## 2017-01-04 NOTE — Progress Notes (Signed)
CT ordered of the abdomen with contrast to follow-up on abnormal ultrasound of the abdomen which showed the following: [place] Abdomen Limited Ruq  Result Date: 12/31/2016 CLINICAL DATA:  Right upper quadrant abdominal pain for 3 months. EXAM: ULTRASOUND ABDOMEN LIMITED RIGHT UPPER QUADRANT COMPARISON:  None. FINDINGS: Gallbladder: No gallstones or wall thickening visualized. No sonographic Murphy sign noted by sonographer. Mild amount of sludge is seen within the gallbladder lumen. Common bile duct: Diameter: 2.6 mm which is within normal limits. Liver: No focal lesion identified. Increased echogenicity of hepatic parenchyma is noted suggesting fatty infiltration or other diffuse hepatocellular disease. IMPRESSION: Small amount of sludge seen within gallbladder lumen. Increased echogenicity of hepatic parenchyma is noted suggesting fatty infiltration or other diffuse hepatocellular disease. Electronically Signed   By: Lupita RaiderJames  Green Jr, M.D.   On: 12/31/2016 09:18

## 2017-01-05 NOTE — Telephone Encounter (Signed)
Patient notified of results.

## 2017-01-09 ENCOUNTER — Ambulatory Visit (HOSPITAL_COMMUNITY): Admission: RE | Admit: 2017-01-09 | Payer: Self-pay | Source: Ambulatory Visit

## 2017-01-13 ENCOUNTER — Ambulatory Visit (HOSPITAL_COMMUNITY): Admission: RE | Admit: 2017-01-13 | Payer: Self-pay | Source: Ambulatory Visit

## 2017-01-20 ENCOUNTER — Ambulatory Visit (HOSPITAL_COMMUNITY)
Admission: RE | Admit: 2017-01-20 | Discharge: 2017-01-20 | Disposition: A | Discharge status: Home / Self Care | Payer: Self-pay | Source: Ambulatory Visit | Attending: Family Medicine | Admitting: Family Medicine

## 2017-01-20 DIAGNOSIS — R932 Abnormal findings on diagnostic imaging of liver and biliary tract: Secondary | ICD-10-CM | POA: Insufficient documentation

## 2017-01-20 DIAGNOSIS — Q51810 Arcuate uterus: Secondary | ICD-10-CM | POA: Insufficient documentation

## 2017-01-20 DIAGNOSIS — K760 Fatty (change of) liver, not elsewhere classified: Secondary | ICD-10-CM | POA: Insufficient documentation

## 2017-01-20 MED ORDER — IOPAMIDOL (ISOVUE-300) INJECTION 61%
INTRAVENOUS | Status: AC
  Administered 2017-01-20: 100 mL via INTRAVENOUS
  Filled 2017-01-20: qty 100

## 2017-01-21 ENCOUNTER — Other Ambulatory Visit: Payer: Self-pay | Admitting: Family Medicine

## 2017-01-21 ENCOUNTER — Other Ambulatory Visit: Payer: Self-pay

## 2017-01-21 MED ORDER — PRAVASTATIN SODIUM 40 MG PO TABS: 40.0000 mg | ORAL_TABLET | Freq: Every evening | ORAL | 11 refills | Status: DC

## 2017-01-21 NOTE — Progress Notes (Signed)
Pravachol 40 once daily for hyperlipidemia and fatty liver disease managment

## 2017-01-26 ENCOUNTER — Ambulatory Visit: Payer: Self-pay

## 2017-03-25 ENCOUNTER — Ambulatory Visit: Payer: Self-pay | Admitting: Family Medicine

## 2017-09-28 ENCOUNTER — Ambulatory Visit: Payer: Self-pay | Admitting: Family Medicine

## 2017-11-20 ENCOUNTER — Ambulatory Visit: Payer: Self-pay | Admitting: Family Medicine

## 2017-11-23 ENCOUNTER — Ambulatory Visit (INDEPENDENT_AMBULATORY_CARE_PROVIDER_SITE_OTHER): Payer: Self-pay | Admitting: Family Medicine

## 2017-11-23 ENCOUNTER — Encounter: Payer: Self-pay | Admitting: Family Medicine

## 2017-11-23 VITALS — Ht 63.0 in | Wt 194.2 lb

## 2017-11-23 DIAGNOSIS — Z09 Encounter for follow-up examination after completed treatment for conditions other than malignant neoplasm: Secondary | ICD-10-CM

## 2017-11-23 DIAGNOSIS — Z131 Encounter for screening for diabetes mellitus: Secondary | ICD-10-CM

## 2017-11-23 DIAGNOSIS — E782 Mixed hyperlipidemia: Secondary | ICD-10-CM

## 2017-11-23 DIAGNOSIS — F419 Anxiety disorder, unspecified: Secondary | ICD-10-CM

## 2017-11-23 DIAGNOSIS — Z0000 Encounter for general adult medical examination without abnormal findings: Secondary | ICD-10-CM

## 2017-11-23 DIAGNOSIS — F329 Major depressive disorder, single episode, unspecified: Secondary | ICD-10-CM

## 2017-11-23 LAB — POCT URINALYSIS DIP (MANUAL ENTRY)
Bilirubin, UA: NEGATIVE
Blood, UA: NEGATIVE
Glucose, UA: NEGATIVE mg/dL
Ketones, POC UA: NEGATIVE mg/dL
Nitrite, UA: NEGATIVE
Protein Ur, POC: NEGATIVE mg/dL
Spec Grav, UA: >=1.03 — AB (ref 1.010–1.025)
Urobilinogen, UA: 0.2 E.U./dL
pH, UA: 5.5 (ref 5.0–8.0)

## 2017-11-23 LAB — POCT GLYCOSYLATED HEMOGLOBIN (HGB A1C): Hemoglobin A1C: 5.1 % (ref 4.0–5.6)

## 2017-11-23 NOTE — Progress Notes (Signed)
Subjective:    Patient ID: Alicia Parks, female    DOB: 06-06-1976, 42 y.o.   MRN: [id number]  PCP: Raliegh Ip, NP  Chief Complaint  Patient presents with  . Migraine  . Anxiety   HPI Alicia Parks has a history of Migraines, GERD, Fibromyalgia, and Back Pain. She is here today for follow up.   Current Status: She is doing well. She reports having hot flashes. She denies fevers, chills, fatigue, recent infections, and weight loss. She has occasional headaches. She denies visual changes, dizziness, and falls.   She has occasional heart palpitations. Denies chest pain, cough, and shortness of breath.   Her appetite has decreased, but she continues to eat 2 meals a day with snacks in between. Denies abdominal pain, diarrhea, and constipation. Denies blood in her stools, dysuria, and hematuria.   Her anxiety level is mild today. She saw a Veterinary surgeon at [facility] in the past.   She denies pain today.   Past Medical History:  Diagnosis Date  . Back pain   . Complication of anesthesia   . Ear drum perforation   . Fibromyalgia   . GERD (gastroesophageal reflux disease)   . Migraine   . Restless leg syndrome     Current Status:  Family History  Problem Relation Age of Onset  . Hypertension Father   . Heart disease Father   . Gout Father   . Osteoporosis Mother   . Cancer Other     Social History   Socioeconomic History  . Marital status: Divorced    Spouse name: Not on file  . Number of children: Not on file  . Years of education: Not on file  . Highest education level: Not on file  Occupational History  . Not on file  Social Needs  . Financial resource strain: Not on file  . Food insecurity:    Worry: Not on file    Inability: Not on file  . Transportation needs:    Medical: Not on file    Non-medical: Not on file  Tobacco Use  . Smoking status: Never Smoker  . Smokeless tobacco: Never Used  Substance and Sexual Activity  . Alcohol use: No  . Drug  use: Yes    Types: Marijuana  . Sexual activity: Yes    Birth control/protection: Surgical  Lifestyle  . Physical activity:    Days per week: Not on file    Minutes per session: Not on file  . Stress: Not on file  Relationships  . Social connections:    Talks on phone: Not on file    Gets together: Not on file    Attends religious service: Not on file    Active member of club or organization: Not on file    Attends meetings of clubs or organizations: Not on file    Relationship status: Not on file  . Intimate partner violence:    Fear of current or ex partner: Not on file    Emotionally abused: Not on file    Physically abused: Not on file    Forced sexual activity: Not on file  Other Topics Concern  . Not on file  Social History Narrative  . Not on file    Past Surgical History:  Procedure Laterality Date  . TONSILLECTOMY    . TUBAL LIGATION      There is no immunization history on file for this patient.   Current Meds  Medication Sig  . ibuprofen (  ADVIL,MOTRIN) 200 MG tablet Take 400 mg by mouth every 6 (six) hours as needed for moderate pain.  Marland Kitchen. lidocaine (LIDODERM) 5 % Place 1 patch onto the skin daily. Remove & Discard patch within 12 hours or as directed by MD  . Multiple Vitamin (MULTIVITAMIN) tablet Take 1 tablet by mouth daily.  . naproxen (NAPROSYN) 500 MG tablet Take 1 tablet (500 mg total) by mouth 2 (two) times daily.   Allergies  Allergen Reactions  . Compazine [Prochlorperazine Edisylate] Other (See Comments)    Makes body move funny. Shaking  . Effexor [Venlafaxine]     Nightmares and night sweats    Ht 5\' 3"  (1.6 m)   Wt 194 lb 3.2 oz (88.1 kg)   LMP 11/15/2017   BMI 34.40 kg/m    Review of Systems  Constitutional: Negative.   HENT: Negative.   Eyes: Negative.   Respiratory: Negative.   Cardiovascular: Negative.   Gastrointestinal: Negative.   Endocrine: Negative.   Genitourinary: Negative.   Musculoskeletal: Negative.   Skin:  Negative.   Allergic/Immunologic: Negative.   Neurological: Positive for headaches.  Hematological: Negative.   Psychiatric/Behavioral: Negative.    Objective:   Physical Exam  Constitutional: She appears well-developed and well-nourished.  HENT:  Head: Normocephalic and atraumatic.  Right Ear: External ear normal.  Left Ear: External ear normal.  Nose: Nose normal.  Mouth/Throat: Oropharynx is clear and moist.  Eyes: Pupils are equal, round, and reactive to light. Conjunctivae and EOM are normal.  Neck: Normal range of motion. Neck supple.  Cardiovascular: Normal rate, regular rhythm, normal heart sounds and intact distal pulses.  Abdominal: Soft. Bowel sounds are normal.  Musculoskeletal: Normal range of motion.  Skin: Skin is warm and dry. Capillary refill takes less than 2 seconds.  Psychiatric: She has a normal mood and affect. Her behavior is normal. Judgment and thought content normal.  Nursing note and vitals reviewed.  Assessment & Plan:   1. Screening for diabetes mellitus Hgb A1c is normal today at 5.1.  - POCT glycosylated hemoglobin (Hb A1C) - POCT urinalysis dipstick  2. Anxiety and depression - Ambulatory referral to Psychiatry  3. Mixed hyperlipidemia Lipid panel on 10/15/2017 was stable. We will recheck Lipid level today.  - Lipid Panel  4. Health care maintenance - CBC with Differential - Comprehensive metabolic panel - TSH - MM Digital Screening; Future  5. Follow up  we will follow up in 2 months.   No orders of the defined types were placed in this encounter.  Raliegh IpNatalie Aarya Robinson,  MSN, FNP-BC [facility] [address]  [place], [place] [id number] [phone]

## 2017-11-24 LAB — COMPREHENSIVE METABOLIC PANEL
ALT: 24 IU/L (ref 0–32)
AST: 18 IU/L (ref 0–40)
Albumin/Globulin Ratio: 1.8 (ref 1.2–2.2)
Albumin: 4.9 g/dL (ref 3.5–5.5)
Alkaline Phosphatase: 69 IU/L (ref 39–117)
BUN/Creatinine Ratio: 15 (ref 9–23)
BUN: 11 mg/dL (ref 6–24)
Bilirubin Total: 0.3 mg/dL (ref 0.0–1.2)
CO2: 23 mmol/L (ref 20–29)
Calcium: 10.1 mg/dL (ref 8.7–10.2)
Chloride: 102 mmol/L (ref 96–106)
Creatinine, Ser: 0.71 mg/dL (ref 0.57–1.00)
GFR calc Af Amer: 122 mL/min/{1.73_m2} (ref >59)
GFR calc non Af Amer: 106 mL/min/{1.73_m2} (ref >59)
Globulin, Total: 2.8 g/dL (ref 1.5–4.5)
Glucose: 87 mg/dL (ref 65–99)
Potassium: 4.5 mmol/L (ref 3.5–5.2)
Sodium: 144 mmol/L (ref 134–144)
Total Protein: 7.7 g/dL (ref 6.0–8.5)

## 2017-11-24 LAB — TSH: TSH: 3.2 u[IU]/mL (ref 0.450–4.500)

## 2017-11-24 LAB — CBC WITH DIFFERENTIAL/PLATELET
Basophils Absolute: 0 10*3/uL (ref 0.0–0.2)
Basos: 0 %
EOS (ABSOLUTE): 0.2 10*3/uL (ref 0.0–0.4)
Eos: 1 %
Hematocrit: 43.9 % (ref 34.0–46.6)
Hemoglobin: 14.7 g/dL (ref 11.1–15.9)
Immature Grans (Abs): 0 10*3/uL (ref 0.0–0.1)
Immature Granulocytes: 0 %
Lymphocytes Absolute: 4.6 10*3/uL — ABNORMAL HIGH (ref 0.7–3.1)
Lymphs: 37 %
MCH: 30.2 pg (ref 26.6–33.0)
MCHC: 33.5 g/dL (ref 31.5–35.7)
MCV: 90 fL (ref 79–97)
Monocytes Absolute: 0.6 10*3/uL (ref 0.1–0.9)
Monocytes: 5 %
Neutrophils Absolute: 7.1 10*3/uL — ABNORMAL HIGH (ref 1.4–7.0)
Neutrophils: 57 %
Platelets: 465 10*3/uL — ABNORMAL HIGH (ref 150–450)
RBC: 4.86 x10E6/uL (ref 3.77–5.28)
RDW: 13.8 % (ref 12.3–15.4)
WBC: 12.5 10*3/uL — ABNORMAL HIGH (ref 3.4–10.8)

## 2017-11-24 LAB — LIPID PANEL
Chol/HDL Ratio: 4.9 ratio — ABNORMAL HIGH (ref 0.0–4.4)
Cholesterol, Total: 211 mg/dL — ABNORMAL HIGH (ref 100–199)
HDL: 43 mg/dL (ref >39)
LDL Calculated: 123 mg/dL — ABNORMAL HIGH (ref 0–99)
Triglycerides: 226 mg/dL — ABNORMAL HIGH (ref 0–149)
VLDL Cholesterol Cal: 45 mg/dL — ABNORMAL HIGH (ref 5–40)

## 2017-12-07 ENCOUNTER — Ambulatory Visit: Payer: Self-pay | Admitting: Family Medicine

## 2017-12-17 ENCOUNTER — Other Ambulatory Visit: Payer: Self-pay | Admitting: Family Medicine

## 2017-12-17 DIAGNOSIS — Z1231 Encounter for screening mammogram for malignant neoplasm of breast: Secondary | ICD-10-CM

## 2017-12-21 ENCOUNTER — Ambulatory Visit: Payer: Self-pay | Admitting: Family Medicine

## 2018-01-15 ENCOUNTER — Emergency Department (HOSPITAL_COMMUNITY): Payer: Self-pay

## 2018-01-15 ENCOUNTER — Emergency Department (HOSPITAL_COMMUNITY)
Admission: EM | Admit: 2018-01-15 | Discharge: 2018-01-16 | Disposition: A | Discharge status: Home / Self Care | Payer: Self-pay | Attending: Emergency Medicine | Admitting: Emergency Medicine

## 2018-01-15 ENCOUNTER — Encounter (HOSPITAL_COMMUNITY): Payer: Self-pay

## 2018-01-15 DIAGNOSIS — R079 Chest pain, unspecified: Secondary | ICD-10-CM | POA: Insufficient documentation

## 2018-01-15 DIAGNOSIS — Z5321 Procedure and treatment not carried out due to patient leaving prior to being seen by health care provider: Secondary | ICD-10-CM | POA: Insufficient documentation

## 2018-01-15 LAB — BASIC METABOLIC PANEL
Anion gap: 10 (ref 5–15)
BUN: 10 mg/dL (ref 6–20)
CHLORIDE: 106 mmol/L (ref 98–111)
CO2: 25 mmol/L (ref 22–32)
Calcium: 9.4 mg/dL (ref 8.9–10.3)
Creatinine, Ser: 0.83 mg/dL (ref 0.44–1.00)
GFR calc Af Amer: >60 mL/min (ref >60)
GFR calc non Af Amer: >60 mL/min (ref >60)
GLUCOSE: 92 mg/dL (ref 70–99)
Potassium: 4 mmol/L (ref 3.5–5.1)
Sodium: 141 mmol/L (ref 135–145)

## 2018-01-15 LAB — CBC
HEMATOCRIT: 43.2 % (ref 36.0–46.0)
Hemoglobin: 14.2 g/dL (ref 12.0–15.0)
MCH: 30.3 pg (ref 26.0–34.0)
MCHC: 32.9 g/dL (ref 30.0–36.0)
MCV: 92.3 fL (ref 78.0–100.0)
Platelets: 408 10*3/uL — ABNORMAL HIGH (ref 150–400)
RBC: 4.68 MIL/uL (ref 3.87–5.11)
RDW: 12.9 % (ref 11.5–15.5)
WBC: 17.6 10*3/uL — ABNORMAL HIGH (ref 4.0–10.5)

## 2018-01-15 LAB — I-STAT BETA HCG BLOOD, ED (MC, WL, AP ONLY): I-stat hCG, quantitative: <5 m[IU]/mL (ref <5)

## 2018-01-15 LAB — I-STAT TROPONIN, ED: Troponin i, poc: 0 ng/mL (ref 0.00–0.08)

## 2018-01-15 NOTE — ED Triage Notes (Signed)
Pt states that she woke up this morning with CP, thought is was acid reflux, took some medication with out relief, radiation to neck, and SOB, dizziness, denies n/v, reports sore throat as well and painful to swallow. Reports a lot of stress at home

## 2018-01-16 ENCOUNTER — Emergency Department (HOSPITAL_COMMUNITY)
Admission: EM | Admit: 2018-01-16 | Discharge: 2018-01-16 | Disposition: A | Discharge status: Home / Self Care | Payer: Self-pay | Attending: Emergency Medicine | Admitting: Emergency Medicine

## 2018-01-16 ENCOUNTER — Encounter (HOSPITAL_COMMUNITY): Payer: Self-pay | Admitting: *Deleted

## 2018-01-16 DIAGNOSIS — K2100 Gastro-esophageal reflux disease with esophagitis, without bleeding: Secondary | ICD-10-CM

## 2018-01-16 MED ORDER — GI COCKTAIL ~~LOC~~
30.0000 mL | Freq: Once | ORAL | Status: AC
  Administered 2018-01-16: 30 mL via ORAL
  Filled 2018-01-16: qty 30

## 2018-01-16 MED ORDER — PANTOPRAZOLE SODIUM 20 MG PO TBEC: 20.0000 mg | DELAYED_RELEASE_TABLET | Freq: Every day | ORAL | 0 refills | Status: DC

## 2018-01-16 NOTE — ED Notes (Signed)
No answer for vitals X3 

## 2018-01-16 NOTE — ED Notes (Signed)
No answer for vitals x1 

## 2018-01-16 NOTE — ED Triage Notes (Signed)
Pt complains of chest pain, difficulty swallowing since yesterday morning. [facility] to [facility], had labs, chest x-ray and EKG performed but did not stay to be seen by a provider. Pt states her symptoms have not changed since yesterday.

## 2018-01-16 NOTE — ED Notes (Signed)
No answer for vitals x2 

## 2018-01-16 NOTE — Discharge Instructions (Addendum)
Avoid NSAIDs, spicy and acidic foods.  Take medication as prescribed.  May also take over-the-counter Mylanta or Maalox.  If your symptoms persist follow-up with a gastroenterologist.

## 2018-01-16 NOTE — ED Provider Notes (Signed)
[facility]-EMERGENCY DEPT Provider Note   CSN: [id number] Arrival date & time: 01/16/18  1448     History   Chief Complaint Chief Complaint  Patient presents with  . Chest Pain    HPI Alicia Parks is a 42 y.o. female.  HPI Patient with a history of gastroesophageal reflux disease presents with pain with swallowing since yesterday morning.  States she is having pain on the right side of her neck.  States she swallows she feels tightness in her chest.  Denies any shortness of breath.  No abdominal pain.  No fever or chills.  Patient does not take a PPI.  No new lower extremity swelling or pain.  Was seen in the emergency department at [facility] yesterday evening.  Had labs, x-ray and EKG performed.  Patient left prior to being evaluated by a provider.  Denies dietary changes or NSAID use. Past Medical History:  Diagnosis Date  . Back pain   . Complication of anesthesia   . Ear drum perforation   . Fibromyalgia   . GERD (gastroesophageal reflux disease)   . Migraine   . Restless leg syndrome     There are no active problems to display for this patient.   Past Surgical History:  Procedure Laterality Date  . TONSILLECTOMY    . TUBAL LIGATION       OB History    Gravida  3   Para  3   Term  3   Preterm  0   AB  0   Living  3     SAB  0   TAB  0   Ectopic  0   Multiple  0   Live Births               Home Medications    Prior to Admission medications   Medication Sig Start Date End Date Taking? Authorizing Provider  Multiple Vitamin (MULTIVITAMIN) tablet Take 1 tablet by mouth daily.   Yes [provider]  lidocaine (LIDODERM) 5 % Place 1 patch onto the skin daily. Remove & Discard patch within 12 hours or as directed by MD Patient not taking: Reported on 01/16/2018 12/04/16   Antony MaduraHumes, Kelly, PA-C  naproxen (NAPROSYN) 500 MG tablet Take 1 tablet (500 mg total) by mouth 2 (two) times daily. Patient not taking: Reported  on 01/16/2018 12/04/16   Antony MaduraHumes, Kelly, PA-C  pantoprazole (PROTONIX) 20 MG tablet Take 1 tablet (20 mg total) by mouth daily. 01/16/18   Loren RacerYelverton, Blen Ransome, MD  pravastatin (PRAVACHOL) 40 MG tablet Take 1 tablet (40 mg total) by mouth every evening. Patient not taking: Reported on 11/23/2017 01/21/17 01/21/18  Bing NeighborsHarris, Kimberly S, FNP    Family History Family History  Problem Relation Age of Onset  . Hypertension Father   . Heart disease Father   . Gout Father   . Osteoporosis Mother   . Cancer Other     Social History Social History   Tobacco Use  . Smoking status: Never Smoker  . Smokeless tobacco: Never Used  Substance Use Topics  . Alcohol use: No  . Drug use: Yes    Types: Marijuana     Allergies   Aspirin; Compazine [prochlorperazine edisylate]; and Effexor [venlafaxine]   Review of Systems Review of Systems  Constitutional: Negative for chills and fever.  HENT: Positive for sore throat and trouble swallowing. Negative for congestion, facial swelling and voice change.   Respiratory: Positive for chest tightness. Negative for  cough and shortness of breath.   Cardiovascular: Negative for chest pain and palpitations.  Gastrointestinal: Negative for abdominal pain, constipation, diarrhea, nausea and vomiting.  Musculoskeletal: Positive for neck pain. Negative for back pain, myalgias and neck stiffness.  Skin: Negative for rash and wound.  Neurological: Negative for weakness, light-headedness, numbness and headaches.  All other systems reviewed and are negative.    Physical Exam Updated Vital Signs BP 122/85   Pulse 89   Temp 98.5 F (36.9 C)   Resp 15   LMP 01/14/2018   SpO2 100%   Physical Exam  Constitutional: She is oriented to person, place, and time. She appears well-developed and well-nourished. No distress.  HENT:  Head: Normocephalic and atraumatic.  Mouth/Throat: Oropharynx is clear and moist. No oropharyngeal exudate.  Oropharynx is mildly erythematous.   Uvula is midline.  Eyes: Pupils are equal, round, and reactive to light. Conjunctivae and EOM are normal.  Neck: Normal range of motion. Neck supple. No JVD present. No tracheal deviation present. No thyromegaly present.  Cardiovascular: Normal rate and regular rhythm. Exam reveals no gallop and no friction rub.  No murmur heard. Pulmonary/Chest: Effort normal and breath sounds normal. No stridor. No respiratory distress. She has no wheezes. She has no rales. She exhibits no tenderness.  Abdominal: Soft. Bowel sounds are normal. There is no tenderness. There is no rebound and no guarding.  Musculoskeletal: Normal range of motion. She exhibits no edema or tenderness.  No lower extremity swelling, asymmetry or tenderness.  Lymphadenopathy:    She has no cervical adenopathy.  Neurological: She is alert and oriented to person, place, and time.  Moves all extremities without focal deficit.  Sensation intact.  Skin: Skin is warm and dry. Capillary refill takes less than 2 seconds. No rash noted. She is not diaphoretic. No erythema.  Psychiatric: She has a normal mood and affect. Her behavior is normal.  Nursing note and vitals reviewed.    ED Treatments / Results  Labs (all labs ordered are listed, but only abnormal results are displayed) Labs Reviewed - No data to display  EKG EKG Interpretation  Date/Time:  Saturday January 16 2018 16:25:27 EDT Ventricular Rate:  77 PR Interval:    QRS Duration: 92 QT Interval:  391 QTC Calculation: 443 R Axis:   61 Text Interpretation:  Age not entered, assumed to be  42 years old for purpose of ECG interpretation Sinus rhythm Confirmed by Loren Racer ([id number]) on 01/16/2018 4:31:44 PM Also confirmed by Loren Racer ([id number]), editor Barbette Hair [phone])  on 01/16/2018 4:46:58 PM   Radiology Dg Chest 2 View  Result Date: 01/15/2018 CLINICAL DATA:  Chest pain, shortness of breath, and cough for a few hours. EXAM: CHEST - 2 VIEW COMPARISON:   12/04/2016 FINDINGS: The heart size and mediastinal contours are within normal limits. Both lungs are clear. The visualized skeletal structures are unremarkable. IMPRESSION: No active cardiopulmonary disease. Electronically Signed   By: Burman Nieves M.D.   On: 01/15/2018 22:53    Procedures Procedures (including critical care time)  Medications Ordered in ED Medications  gi cocktail (Maalox,Lidocaine,Donnatal) (30 mLs Oral Given 01/16/18 1659)     Initial Impression / Assessment and Plan / ED Course  I have reviewed the triage vital signs and the nursing notes.  Pertinent labs & imaging results that were available during my care of the patient were reviewed by me and considered in my medical decision making (see chart for details).     Reviewed patient's  labs from yesterday.  Troponin was normal.  EKG without ischemic findings.  Normal chest x-ray.  Patient's discomfort has been constant since yesterday morning.  Consistent with esophagitis versus esophageal dysmotility.  Low suspicion for CAD.  Will repeat EKG and give GI cocktail. Patient states she is feeling much better after the GI cocktail.  Will start her on PPI and have given lifestyle modification advised.  Advised to avoid all NSAIDs.  Return precautions have been given. Final Clinical Impressions(s) / ED Diagnoses   Final diagnoses:  Esophagitis, reflux    ED Discharge Orders        Ordered    pantoprazole (PROTONIX) 20 MG tablet  Daily     01/16/18 1720       Loren Racer, MD 01/16/18 1720

## 2018-02-10 ENCOUNTER — Ambulatory Visit: Payer: Self-pay | Admitting: Family Medicine

## 2018-02-15 ENCOUNTER — Ambulatory Visit: Payer: Self-pay | Admitting: Family Medicine

## 2018-12-07 MED FILL — DIVALPROEX SOD DR 250 MG TA: 250 | 30 days supply | Qty: 60 | Fill #0

## 2018-12-07 MED FILL — OLANZAPINE 5 MG TBDP: 5 | 30 days supply | Qty: 30 | Fill #0

## 2018-12-07 MED FILL — hydrOXYzine HCL 25 MG TABS: 25 | 30 days supply | Qty: 90 | Fill #0

## 2018-12-07 MED FILL — SERTRALINE HCL 50 MG TABS: 50 | 10 days supply | Qty: 30 | Fill #0

## 2019-01-25 ENCOUNTER — Ambulatory Visit: Payer: Self-pay | Admitting: Family Medicine

## 2019-02-07 ENCOUNTER — Ambulatory Visit: Payer: Self-pay | Admitting: Family Medicine

## 2019-02-15 MED FILL — OLANZapine 7.5 MG TABS: 7.5 | 30 days supply | Qty: 30 | Fill #0

## 2019-02-15 MED FILL — hydrOXYzine HCL 25 MG TABS: 25 | 30 days supply | Qty: 90 | Fill #0

## 2019-03-01 MED FILL — SERTRALINE HCL 50 MG TABS: 50 | 30 days supply | Qty: 30 | Fill #0

## 2019-08-08 ENCOUNTER — Ambulatory Visit: Payer: Self-pay | Attending: Family Medicine | Admitting: Family Medicine

## 2019-08-08 ENCOUNTER — Other Ambulatory Visit: Payer: Self-pay

## 2019-08-08 ENCOUNTER — Encounter: Payer: Self-pay | Admitting: Family Medicine

## 2019-08-08 DIAGNOSIS — K760 Fatty (change of) liver, not elsewhere classified: Secondary | ICD-10-CM

## 2019-08-08 DIAGNOSIS — K219 Gastro-esophageal reflux disease without esophagitis: Secondary | ICD-10-CM

## 2019-08-08 DIAGNOSIS — R002 Palpitations: Secondary | ICD-10-CM

## 2019-08-08 DIAGNOSIS — R1011 Right upper quadrant pain: Secondary | ICD-10-CM

## 2019-08-08 DIAGNOSIS — F411 Generalized anxiety disorder: Secondary | ICD-10-CM

## 2019-08-08 DIAGNOSIS — E782 Mixed hyperlipidemia: Secondary | ICD-10-CM

## 2019-08-08 DIAGNOSIS — F430 Acute stress reaction: Secondary | ICD-10-CM

## 2019-08-08 NOTE — Progress Notes (Signed)
Virtual Visit via Telephone Note  I connected with Alicia Parks on 08/08/19 at  3:30 PM EST by telephone and verified that I am speaking with the correct person using two identifiers.   I discussed the limitations, risks, security and privacy concerns of performing an evaluation and management service by telephone and the availability of in person appointments. I also discussed with the patient that there may be a patient responsible charge related to this service. The patient expressed understanding and agreed to proceed.  Patient Location: Home Provider Location: [facility] Others participating in call: none  -Call placed to patient at 3:58 pm and went straight to voice mail; patient had called earlier and call was lost when front desk attempted to transfer  History of Present Illness:       44 yo female who states that she previously saw Ms. Harris at [facility] in 2018 and she thinks that she was evaluated for right upper quadrant pain and fatty liver.  Patient reports that she still has occasional discomfort in her right upper quadrant but has had no recent follow-up regarding fatty liver.  She also reports that she has had some recent issues with palpitations but she believes that these may be anxiety related.  She does report a history of anxiety and depression and was treated in the past at [facility].  She is not currently on any medication for anxiety.  She believes that some of the anxiety is related to the current COVID-19 pandemic as she feels as if she is under increased stress and has mostly been at home.  She denies suicidal thoughts or ideations.  She also has a history of acid reflux symptoms and she believes that her reflux is worsened by her anxiety.  She does take pantoprazole to help with her reflux symptoms.  She has also been on pravastatin for cholesterol but she has had no recent follow-up of her cholesterol and she is unsure if she needs to continue to be on this  medication.  She denies any issues with chest pain, no nausea or vomiting.  She does have some occasional joint and muscle pain but she does not believe that this is related to her use of cholesterol medicine.   Past Medical History:  Diagnosis Date  . Back pain   . Complication of anesthesia   . Ear drum perforation   . Fibromyalgia   . GERD (gastroesophageal reflux disease)   . Migraine   . Restless leg syndrome     Past Surgical History:  Procedure Laterality Date  . TONSILLECTOMY    . TUBAL LIGATION      Family History  Problem Relation Age of Onset  . Hypertension Father   . Heart disease Father   . Gout Father   . Osteoporosis Mother   . Cancer Other     Social History   Tobacco Use  . Smoking status: Never Smoker  . Smokeless tobacco: Never Used  Substance Use Topics  . Alcohol use: No  . Drug use: Yes    Types: Marijuana     Allergies  Allergen Reactions  . Aspirin Other (See Comments)    "gives me the jitters"  . Compazine [Prochlorperazine Edisylate] Other (See Comments)    Makes body move funny. Shaking  . Effexor [Venlafaxine]     Nightmares and night sweats       Observations/Objective: No vital signs or physical exam conducted as visit was done via telephone  Assessment  and Plan: 1. Fatty liver Patient with complaint of some right upper quadrant discomfort and has history of fatty liver.  On review of chart, patient had ultrasound of the gallbladder 12/31/2016 showing mild sludge and CT of the abdomen on 01/20/2017 showed diffuse hepatic steatosis with unremarkable gallbladder.  We will have patient repeat right upper quadrant ultrasound to assess the liver/gallbladder as well as comprehensive metabolic panel at upcoming lab visit in  follow-up of fatty liver and occasional right upper quadrant discomfort she may also need HIDA scan if she continues to have issues with right upper quadrant abdominal discomfort. - US Abdomen Limited RUQ; Future -  Comprehensive metabolic panel; Future  2. Gastroesophageal reflux disease, unspecified whether esophagitis present Review of chart, patient has had past issues with acid reflux and she is encouraged to continue the use of pantoprazole as well as avoidance of known trigger foods and avoidance of late night eating  3. Palpitations She has been asked to schedule a lab visit for comprehensive metabolic panel to check electrolytes, CBC to look for anemia/blood disorder and T4/TSH to look for thyroid disorder in follow-up of her complaints of palpitations.  She should avoid/limit caffeine intake and try to get adequate rest to see if this helps with her sensation of palpitations. - Comprehensive metabolic panel; Future - CBC; Future - T4 AND TSH; Future  4. Mixed hyperlipidemia Patient with mixed hyperlipidemia on prior blood work and she has been asked to schedule an appointment for fasting labs including lipid panel. - Lipid Panel; Future  5. Anxiety as acute reaction to exceptional stress She is not ready to take medication to help with anxiety at this time but will consider following up with the medical social worker.  She will call back if she decides to follow-up with social work regarding her anxiety.  She does report that she has been to [facility] in the past and she is encouraged to call [facility] to reestablish care.  Follow Up Instructions:Return in about 2 weeks (around 08/22/2019) for palpitations; lab visit next week.    I discussed the assessment and treatment plan with the patient. The patient was provided an opportunity to ask questions and all were answered. The patient agreed with the plan and demonstrated an understanding of the instructions.   The patient was advised to call back or seek an in-person evaluation if the symptoms worsen or if the condition fails to improve as anticipated.  I provided  15 minutes of non-face-to-face time during this encounter.   Cain Saupe, MD

## 2019-08-08 NOTE — Progress Notes (Signed)
Patient verified DOB Patient has eaten and taken medication today. patient complains of chonic back pain being present and scaled at an 8 described as sharp pain with bending and constant aching.

## 2019-08-15 ENCOUNTER — Other Ambulatory Visit: Payer: Self-pay

## 2019-08-17 ENCOUNTER — Other Ambulatory Visit: Payer: Self-pay

## 2019-08-17 MED FILL — SERTRALINE HCL 50 MG TABLET: 50 | 30 days supply | Qty: 30 | Fill #0

## 2019-08-17 MED FILL — OLANZapine 7.5 MG TABS: 7.5 | 30 days supply | Qty: 30 | Fill #0

## 2019-08-17 MED FILL — hydrOXYzine HCL 25 MG TABS: 25 | 30 days supply | Qty: 90 | Fill #0

## 2019-08-17 MED FILL — DIVALPROEX SOD DR 250 MG TA: 250 | 30 days supply | Qty: 60 | Fill #0

## 2019-08-19 ENCOUNTER — Other Ambulatory Visit: Payer: Self-pay

## 2019-08-24 ENCOUNTER — Ambulatory Visit: Payer: Self-pay | Admitting: Family Medicine

## 2019-08-31 MED FILL — SERTRALINE HCL 50 MG TABLET: 50 | 30 days supply | Qty: 30 | Fill #0

## 2019-12-07 ENCOUNTER — Ambulatory Visit (HOSPITAL_COMMUNITY): Payer: Self-pay | Admitting: Clinical

## 2019-12-07 ENCOUNTER — Ambulatory Visit (HOSPITAL_COMMUNITY): Payer: Self-pay | Admitting: Psychiatry

## 2019-12-18 ENCOUNTER — Other Ambulatory Visit: Payer: Self-pay

## 2019-12-18 ENCOUNTER — Emergency Department (HOSPITAL_COMMUNITY)
Admission: EM | Admit: 2019-12-18 | Discharge: 2019-12-18 | Disposition: A | Discharge status: Home / Self Care | Payer: Self-pay | Attending: Emergency Medicine | Admitting: Emergency Medicine

## 2019-12-18 ENCOUNTER — Encounter (HOSPITAL_COMMUNITY): Payer: Self-pay | Admitting: Emergency Medicine

## 2019-12-18 DIAGNOSIS — K219 Gastro-esophageal reflux disease without esophagitis: Secondary | ICD-10-CM

## 2019-12-18 DIAGNOSIS — M26602 Left temporomandibular joint disorder, unspecified: Secondary | ICD-10-CM | POA: Insufficient documentation

## 2019-12-18 DIAGNOSIS — M26609 Unspecified temporomandibular joint disorder, unspecified side: Secondary | ICD-10-CM

## 2019-12-18 MED ORDER — CETIRIZINE HCL 10 MG PO TABS: 10.0000 mg | ORAL_TABLET | Freq: Every day | ORAL | 1 refills | Status: DC

## 2019-12-18 MED ORDER — OMEPRAZOLE 20 MG PO CPDR: 20.0000 mg | DELAYED_RELEASE_CAPSULE | Freq: Two times a day (BID) | ORAL | 1 refills | Status: DC

## 2019-12-18 NOTE — ED Triage Notes (Signed)
Per pt, states left ear pain on and off for 1 month-complaining of nostrils being dry

## 2019-12-18 NOTE — Discharge Instructions (Signed)
Please read the attachments on TMJ dysfunction, GERD, and how to perform a sinus rinse.  Please take your medications, as directed.  I encourage you to perform sinus rinses to help with your reported sinus congestion, particularly since Flonase is not an option.  It is important that you get established with your primary care provider as soon as possible for ongoing evaluation and management of your health and wellbeing.  Please return to the ER or seek immediate medical attention should you experience any new or worsening symptoms.

## 2019-12-18 NOTE — ED Provider Notes (Signed)
[facility]-EMERGENCY DEPT Provider Note   CSN: [id number] Arrival date & time: 12/18/19  1340     History Chief Complaint  Patient presents with  . Otalgia    Alicia Parks is a 44 y.o. female with no significant past medical history who presents to the ED with multiple complaints.  Patient reports that she finally has insurance and is in the process of getting established with a primary care provider.  She states that over the course of the past month, she has been having intermittent episodes of left-sided discomfort on her cheekbone anterior to her left ear.  She states that she wakes up each morning and her teeth feel clenched and that she will occasionally have discomfort with chewing or opening her mouth.  She states this radiates towards her left ear.  She also was endorsing sinus congestion and dry nostrils.  She states that she has been taking Motrin and Advil for her pain symptoms because she is limited due to her fatty liver disease.  She also reports that she has reflux every single day and that she eats a lot of Tums.  She denies any recent illness or infection, fevers or chills, chest pain or difficulty breathing, cough, hearing loss, room spinning dizziness, or other symptoms.    HPI     Past Medical History:  Diagnosis Date  . Back pain   . Complication of anesthesia   . Ear drum perforation   . Fibromyalgia   . GERD (gastroesophageal reflux disease)   . Migraine   . Restless leg syndrome     There are no problems to display for this patient.   Past Surgical History:  Procedure Laterality Date  . TONSILLECTOMY    . TUBAL LIGATION       OB History    Gravida  3   Para  3   Term  3   Preterm  0   AB  0   Living  3     SAB  0   TAB  0   Ectopic  0   Multiple  0   Live Births              Family History  Problem Relation Age of Onset  . Hypertension Father   . Heart disease Father   . Gout Father   .  Osteoporosis Mother   . Cancer Other     Social History   Tobacco Use  . Smoking status: Never Smoker  . Smokeless tobacco: Never Used  Substance Use Topics  . Alcohol use: No  . Drug use: Yes    Types: Marijuana    Home Medications Prior to Admission medications   Medication Sig Start Date End Date Taking? Authorizing Provider  cetirizine (ALLERGY RELIEF CETIRIZINE) 10 MG tablet Take 1 tablet (10 mg total) by mouth daily. 12/18/19   Lorelee New, PA-C  Multiple Vitamin (MULTIVITAMIN) tablet Take 1 tablet by mouth daily.    [provider]  omeprazole (PRILOSEC) 20 MG capsule Take 1 capsule (20 mg total) by mouth 2 (two) times daily before a meal. 12/18/19   Chilton Si, Sharion Settler, PA-C  pantoprazole (PROTONIX) 20 MG tablet Take 1 tablet (20 mg total) by mouth daily. 01/16/18   Loren Racer, MD  pravastatin (PRAVACHOL) 40 MG tablet Take 1 tablet (40 mg total) by mouth every evening. 01/21/17 08/08/19  Bing Neighbors, FNP    Allergies    Aspirin, Compazine [prochlorperazine edisylate],  and Effexor [venlafaxine]  Review of Systems   Review of Systems  All other systems reviewed and are negative.   Physical Exam Updated Vital Signs BP (!) 145/94 (BP Location: Left Arm)   Pulse 89   Temp 98 F (36.7 C) (Oral)   Resp 18   Ht 5\' 3"  (1.6 m)   Wt 77.1 kg   SpO2 98%   BMI 30.11 kg/m   Physical Exam Vitals and nursing note reviewed. Exam conducted with a chaperone present.  Constitutional:      General: She is not in acute distress.    Appearance: Normal appearance. She is not ill-appearing.  HENT:     Head: Normocephalic and atraumatic.     Ears:     Comments: Scar tissue on TMs bilaterally.  No erythema or bulging.  Canals clear, no cerumen.  No discharge.  No significant external ear TTP or erythema.  No mastoid tenderness.    Mouth/Throat:     Comments: Left-sided discomfort over area of TMJ when opening and closing mouth.  No overlying erythema, warmth, or  other skin changes.  Oropharynx is patent.  No uvular deviation or asymmetry is appreciated.  No floor of mouth induration.  Hard palate rises symmetrically.  No trismus or difficulty tolerating secretions. Eyes:     General: No scleral icterus.    Conjunctiva/sclera: Conjunctivae normal.  Cardiovascular:     Rate and Rhythm: Normal rate and regular rhythm.     Pulses: Normal pulses.     Heart sounds: Normal heart sounds.  Pulmonary:     Effort: Pulmonary effort is normal. No respiratory distress.     Breath sounds: Normal breath sounds.  Musculoskeletal:     Cervical back: Normal range of motion and neck supple. No rigidity.  Skin:    General: Skin is dry.     Capillary Refill: Capillary refill takes less than 2 seconds.  Neurological:     Mental Status: She is alert and oriented to person, place, and time.     GCS: GCS eye subscore is 4. GCS verbal subscore is 5. GCS motor subscore is 6.  Psychiatric:        Mood and Affect: Mood normal.        Behavior: Behavior normal.        Thought Content: Thought content normal.     ED Results / Procedures / Treatments   Labs (all labs ordered are listed, but only abnormal results are displayed) Labs Reviewed - No data to display  EKG None  Radiology No results found.  Procedures Procedures (including critical care time)  Medications Ordered in ED Medications - No data to display  ED Course  I have reviewed the triage vital signs and the nursing notes.  Pertinent labs & imaging results that were available during my care of the patient were reviewed by me and considered in my medical decision making (see chart for details).    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  Will provide her with an attachment and advice on how to manage her symptoms.  She may benefit from wearing a mouthguard at night.  She also is endorsing sinus congestion, but does  not take any antihistamines.  She also states that she cannot take Flonase as it caused her to have significant inflammation years  ago.  Will recommend sinus rinses instead.  She also notes that she experiences daily episodes of reflux for which she takes numerous times.  She denies any melena or hematemesis.  She does not take any proton pump inhibitors, but states that she has been previously diagnosed with GERD.  She states that she understands that these complaints are nonemergent and does plan to follow-up with a primary care provider soon as possible.  She is excited that she now has insurance.  I have low suspicion for any acute or emergent pathology.  Her vital signs are stable and WNL aside from mildly elevated blood pressure.  She denies any fevers.  While she has tenderness in area of TMJ, low suspicion for parotitis.  No overlying skin changes or swelling appreciated.  Her ears show chronic scar tissue over TMs bilaterally, but she notes history of tube placements.  No erythema or bulging.  Her symptoms are subacute over the course of weeks if not months and she reports that her symptoms are intermittent rather than constant or progressive.  Patient is resting comfortably in no acute distress.  Strict ED return precautions discussed.  All of the evaluation and work-up results were discussed with the patient and any family at bedside. They were provided opportunity to ask any additional questions and have none at this time. They have expressed understanding of verbal discharge instructions as well as return precautions and are agreeable to the plan.    Final Clinical Impression(s) / ED Diagnoses Final diagnoses:  TMJ dysfunction  Gastroesophageal reflux disease, unspecified whether esophagitis present    Rx / DC Orders ED Discharge Orders         Ordered    cetirizine (ALLERGY RELIEF CETIRIZINE) 10 MG tablet  Daily     Discontinue  Reprint     12/18/19 1540    omeprazole (PRILOSEC) 20  MG capsule  2 times daily before meals     Discontinue  Reprint     12/18/19 1540           Reita Chard 12/18/19 1542    Virgel Manifold, MD 12/18/19 [phone]

## 2019-12-18 NOTE — ED Notes (Signed)
ED Provider at bedside. 

## 2020-01-09 ENCOUNTER — Ambulatory Visit (HOSPITAL_COMMUNITY): Payer: Self-pay | Admitting: Clinical

## 2020-01-09 ENCOUNTER — Ambulatory Visit (HOSPITAL_COMMUNITY): Payer: Self-pay | Admitting: Psychiatry

## 2020-02-07 ENCOUNTER — Ambulatory Visit (HOSPITAL_COMMUNITY): Payer: Self-pay | Admitting: Clinical

## 2020-02-13 ENCOUNTER — Ambulatory Visit: Payer: Medicaid Other | Admitting: Family Medicine

## 2020-03-07 ENCOUNTER — Telehealth (HOSPITAL_COMMUNITY): Payer: No Payment, Other | Admitting: Psychiatry

## 2020-03-07 ENCOUNTER — Other Ambulatory Visit: Payer: Self-pay

## 2020-04-25 ENCOUNTER — Other Ambulatory Visit: Payer: Self-pay

## 2020-04-25 ENCOUNTER — Telehealth (INDEPENDENT_AMBULATORY_CARE_PROVIDER_SITE_OTHER): Payer: No Payment, Other | Admitting: Psychiatry

## 2020-04-25 ENCOUNTER — Encounter (HOSPITAL_COMMUNITY): Payer: Self-pay | Admitting: Psychiatry

## 2020-04-25 ENCOUNTER — Other Ambulatory Visit (HOSPITAL_COMMUNITY): Payer: Self-pay | Admitting: Psychiatry

## 2020-04-25 DIAGNOSIS — F331 Major depressive disorder, recurrent, moderate: Secondary | ICD-10-CM | POA: Diagnosis not present

## 2020-04-25 DIAGNOSIS — F3130 Bipolar disorder, current episode depressed, mild or moderate severity, unspecified: Secondary | ICD-10-CM | POA: Diagnosis not present

## 2020-04-25 DIAGNOSIS — F411 Generalized anxiety disorder: Secondary | ICD-10-CM | POA: Diagnosis not present

## 2020-04-25 MED ORDER — BUSPIRONE HCL 10 MG PO TABS: 10.0000 mg | ORAL_TABLET | Freq: Every day | ORAL | 2 refills | Status: DC

## 2020-04-25 MED ORDER — QUETIAPINE FUMARATE 50 MG PO TABS: 50.0000 mg | ORAL_TABLET | Freq: Every day | ORAL | 2 refills | Status: DC

## 2020-04-25 MED ORDER — HYDROXYZINE HCL 25 MG PO TABS: 25.0000 mg | ORAL_TABLET | Freq: Four times a day (QID) | ORAL | 2 refills | Status: DC | PRN

## 2020-04-25 MED FILL — busPIRone HCL 10 MG TABS: 10 | 30 days supply | Qty: 30 | Fill #0

## 2020-04-25 MED FILL — hydrOXYzine HCL 25 MG TABS: 25 | 22 days supply | Qty: 90 | Fill #0

## 2020-04-25 MED FILL — QUETIAPINE FUMARATE 50 MG T: 50 | 30 days supply | Qty: 30 | Fill #0
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
## 2020-05-02 MED FILL — QUETIAPINE FUMARATE 50 MG T: 50 | 30 days supply | Qty: 30 | Fill #0

## 2020-05-02 MED FILL — busPIRone HCL 10 MG TABS: 10 | 30 days supply | Qty: 30 | Fill #0
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
## 2020-06-26 ENCOUNTER — Ambulatory Visit: Payer: Medicaid Other | Admitting: Critical Care Medicine

## 2020-07-26 ENCOUNTER — Telehealth (HOSPITAL_COMMUNITY): Payer: No Payment, Other | Admitting: Psychiatry

## 2020-07-26 ENCOUNTER — Other Ambulatory Visit: Payer: Self-pay

## 2020-08-01 ENCOUNTER — Emergency Department (HOSPITAL_COMMUNITY)
Admission: EM | Admit: 2020-08-01 | Discharge: 2020-08-01 | Disposition: A | Discharge status: Home / Self Care | Payer: Medicaid Other | Attending: Emergency Medicine | Admitting: Emergency Medicine

## 2020-08-01 ENCOUNTER — Other Ambulatory Visit (HOSPITAL_COMMUNITY): Payer: Self-pay | Admitting: Emergency Medicine

## 2020-08-01 ENCOUNTER — Encounter (HOSPITAL_COMMUNITY): Payer: Self-pay

## 2020-08-01 ENCOUNTER — Other Ambulatory Visit: Payer: Self-pay

## 2020-08-01 DIAGNOSIS — K029 Dental caries, unspecified: Secondary | ICD-10-CM | POA: Insufficient documentation

## 2020-08-01 MED ORDER — PENICILLIN V POTASSIUM 500 MG PO TABS
500.0000 mg | ORAL_TABLET | Freq: Once | ORAL | Status: AC
  Administered 2020-08-01: 500 mg via ORAL
  Filled 2020-08-01: qty 1

## 2020-08-01 MED ORDER — HYDROCODONE-ACETAMINOPHEN 5-325 MG PO TABS
1.0000 | ORAL_TABLET | Freq: Once | ORAL | Status: AC
  Administered 2020-08-01: 1 via ORAL
  Filled 2020-08-01: qty 1

## 2020-08-01 MED ORDER — PENICILLIN V POTASSIUM 500 MG PO TABS: 500.0000 mg | ORAL_TABLET | Freq: Four times a day (QID) | ORAL | 0 refills | Status: DC

## 2020-08-01 NOTE — Discharge Instructions (Addendum)
Please read instructions below. Take the antibiotic, Penicillin V, 4 times per day until they are gone. Schedule an appointment with a dentist, using the dental resource guide attached. Return to the ER for difficulty swallowing or breathing, fever, or new or worsening symptoms.

## 2020-08-01 NOTE — ED Provider Notes (Signed)
[facility]-EMERGENCY DEPT Provider Note   CSN: [id number] Arrival date & time: 08/01/20  1959     History Chief Complaint  Patient presents with  . Dental Pain    Alicia Parks is a 45 y.o. female presenting to the emergency department with complaint of 2 days of left lower dental pain.  She states pain is to her front left canine and the gingiva surrounding it.  She has tried multiple over-the-counter medications though pain is still throbbing.  She has had no fevers.  She states she is unable to obtain an appointment with a dentist due to the type of Medicaid she has.   The history is provided by the patient.       Past Medical History:  Diagnosis Date  . Back pain   . Complication of anesthesia   . Ear drum perforation   . Fibromyalgia   . GERD (gastroesophageal reflux disease)   . Migraine   . Restless leg syndrome     Patient Active Problem List   Diagnosis Date Noted  . Generalized anxiety disorder 04/25/2020  . Bipolar I disorder, most recent episode depressed (HCC) 04/25/2020  . Moderate episode of recurrent major depressive disorder (HCC) 04/25/2020    Past Surgical History:  Procedure Laterality Date  . TONSILLECTOMY    . TUBAL LIGATION       OB History    Gravida  3   Para  3   Term  3   Preterm  0   AB  0   Living  3     SAB  0   IAB  0   Ectopic  0   Multiple  0   Live Births              Family History  Problem Relation Age of Onset  . Hypertension Father   . Heart disease Father   . Gout Father   . Osteoporosis Mother   . Cancer Other     Social History   Tobacco Use  . Smoking status: Never Smoker  . Smokeless tobacco: Never Used  Substance Use Topics  . Alcohol use: No  . Drug use: Yes    Types: Marijuana    Home Medications Prior to Admission medications   Medication Sig Start Date End Date Taking? Authorizing Provider  penicillin v potassium (VEETID) 500 MG tablet Take 1 tablet  (500 mg total) by mouth 4 (four) times daily for 7 days. 08/01/20 08/08/20 Yes Roxan Hockey, [place] N, PA-C  busPIRone (BUSPAR) 10 MG tablet Take 1 tablet (10 mg total) by mouth daily. 04/25/20   Shanna Cisco, NP  cetirizine (ALLERGY RELIEF CETIRIZINE) 10 MG tablet Take 1 tablet (10 mg total) by mouth daily. 12/18/19   Lorelee New, PA-C  hydrOXYzine (ATARAX/VISTARIL) 25 MG tablet Take 1 tablet (25 mg total) by mouth 4 (four) times daily as needed. 04/25/20   Shanna Cisco, NP  Multiple Vitamin (MULTIVITAMIN) tablet Take 1 tablet by mouth daily.    [provider]  omeprazole (PRILOSEC) 20 MG capsule Take 1 capsule (20 mg total) by mouth 2 (two) times daily before a meal. 12/18/19   Chilton Si, Sharion Settler, PA-C  pantoprazole (PROTONIX) 20 MG tablet Take 1 tablet (20 mg total) by mouth daily. 01/16/18   Loren Racer, MD  pravastatin (PRAVACHOL) 40 MG tablet Take 1 tablet (40 mg total) by mouth every evening. 01/21/17 08/08/19  Bing Neighbors, FNP  QUEtiapine (SEROQUEL) 50  MG tablet Take 1 tablet (50 mg total) by mouth at bedtime. 04/25/20   Shanna Cisco, NP    Allergies    Aspirin, Compazine [prochlorperazine edisylate], and Effexor [venlafaxine]  Review of Systems   Review of Systems  Constitutional: Negative for fever.  HENT: Positive for dental problem.     Physical Exam Updated Vital Signs BP (!) 129/103 (BP Location: Right Arm)   Pulse 80   Temp 98.5 F (36.9 C) (Oral)   Resp 18   SpO2 100%   Physical Exam Vitals and nursing note reviewed.  Constitutional:      General: She is not in acute distress.    Appearance: She is well-developed.  HENT:     Head: Normocephalic and atraumatic.     Mouth/Throat:     Comments: Left lower canine with dental carie present. This is gingival erythema and mild edema noted at this tooth, no fluctuant abscess.  No sublingual edema or tenderness.  Uvula is midline, tolerating secretions. Eyes:     Conjunctiva/sclera:  Conjunctivae normal.  Cardiovascular:     Rate and Rhythm: Normal rate.  Pulmonary:     Effort: Pulmonary effort is normal.  Neurological:     Mental Status: She is alert.  Psychiatric:        Mood and Affect: Mood normal.        Behavior: Behavior normal.     ED Results / Procedures / Treatments   Labs (all labs ordered are listed, but only abnormal results are displayed) Labs Reviewed - No data to display  EKG None  Radiology No results found.  Procedures Procedures   Medications Ordered in ED Medications  HYDROcodone-acetaminophen (NORCO/VICODIN) 5-325 MG per tablet 1 tablet (has no administration in time range)  penicillin v potassium (VEETID) tablet 500 mg (has no administration in time range)    ED Course  I have reviewed the triage vital signs and the nursing notes.  Pertinent labs & imaging results that were available during my care of the patient were reviewed by me and considered in my medical decision making (see chart for details).    MDM Rules/Calculators/A&P                          Patient with dental caries.  No gross abscess.  VSS, afebrile, tolerating secretions. Exam unconcerning for peritonsillar abscess, Ludwig's angina or spread of infection.  Will treat with penicillin and pain medicine.  Dental resource guide provided.  Pt safe for discharge.  Discussed results, findings, treatment and follow up. Patient advised of return precautions. Patient verbalized understanding and agreed with plan.  Final Clinical Impression(s) / ED Diagnoses Final diagnoses:  Pain due to dental caries    Rx / DC Orders ED Discharge Orders         Ordered    penicillin v potassium (VEETID) 500 MG tablet  4 times daily        08/01/20 2056           Jock Mahon, [place] N, PA-C 08/01/20 2057    Cheryll Cockayne, MD 08/03/20 2134

## 2020-08-01 NOTE — ED Triage Notes (Signed)
Patient arrived stating she ad dental pain on the right side of her mouth over the last two days. Unsure if it is on the top or bottom. Patient has been treating with otc medication and has been unable to get into a dentist.

## 2020-08-02 MED FILL — QUETIAPINE FUMARATE 50 MG T: 50 | 30 days supply | Qty: 30 | Fill #1

## 2020-08-02 MED FILL — PENICILLIN VK 500 MG TABLET: 500 | 7 days supply | Qty: 28 | Fill #0

## 2020-08-02 MED FILL — hydrOXYzine HCL 25 MG TABS: 25 | 22 days supply | Qty: 90 | Fill #0

## 2020-09-02 ENCOUNTER — Encounter (HOSPITAL_COMMUNITY): Payer: Self-pay | Admitting: Emergency Medicine

## 2020-09-02 ENCOUNTER — Other Ambulatory Visit: Payer: Self-pay

## 2020-09-02 ENCOUNTER — Emergency Department (HOSPITAL_COMMUNITY)
Admission: EM | Admit: 2020-09-02 | Discharge: 2020-09-03 | Disposition: A | Discharge status: Home / Self Care | Payer: Medicaid Other | Attending: Emergency Medicine | Admitting: Emergency Medicine

## 2020-09-02 ENCOUNTER — Emergency Department (HOSPITAL_COMMUNITY): Payer: Medicaid Other

## 2020-09-02 DIAGNOSIS — R079 Chest pain, unspecified: Secondary | ICD-10-CM

## 2020-09-02 DIAGNOSIS — R0789 Other chest pain: Secondary | ICD-10-CM | POA: Insufficient documentation

## 2020-09-02 DIAGNOSIS — F1210 Cannabis abuse, uncomplicated: Secondary | ICD-10-CM | POA: Insufficient documentation

## 2020-09-02 DIAGNOSIS — F17210 Nicotine dependence, cigarettes, uncomplicated: Secondary | ICD-10-CM | POA: Insufficient documentation

## 2020-09-02 NOTE — ED Triage Notes (Signed)
Patient c/o SOB and tenderness to chest today. Reports increased stress as sister and father died within one month and had a grandchild born today.

## 2020-09-02 NOTE — ED Provider Notes (Signed)
[facility]-EMERGENCY DEPT Provider Note   CSN: [id number] Arrival date & time: 09/02/20  2245     History Chief Complaint  Patient presents with  . Shortness of Breath    Alicia Parks is a 45 y.o. female.  45 yo F with a cc of shortness of breath.  The patient feels like she has trouble getting a full breath in.  Been going on since yesterday.  Has noticed it multiple times throughout the day.  Not exertional.  Also having some chest pain.  She has some trouble describing this.  Points to a pinpoint spots along her left chest.  Seems to come and go.  Not sure what makes it better or worse.  Denies history of MI.  Denies history of hypertension hyperlipidemia diabetes.  She just quit smoking in the past few days.  Father had an MI in his late 20s.  Denies history of PE or DVT denies hemoptysis denies unilateral lower extremity edema denies recent surgery immobilization or hospitalization denies history of cancer denies estrogen use.  The history is provided by the patient.  Shortness of Breath Severity:  Moderate Onset quality:  Gradual Duration:  1 day Timing:  Constant Progression:  Unchanged Chronicity:  New Relieved by:  Nothing Worsened by:  Nothing Ineffective treatments:  None tried Associated symptoms: chest pain   Associated symptoms: no fever, no headaches, no vomiting and no wheezing        Past Medical History:  Diagnosis Date  . Back pain   . Complication of anesthesia   . Ear drum perforation   . Fibromyalgia   . GERD (gastroesophageal reflux disease)   . Migraine   . Restless leg syndrome     Patient Active Problem List   Diagnosis Date Noted  . Generalized anxiety disorder 04/25/2020  . Bipolar I disorder, most recent episode depressed (HCC) 04/25/2020  . Moderate episode of recurrent major depressive disorder (HCC) 04/25/2020    Past Surgical History:  Procedure Laterality Date  . TONSILLECTOMY    . TUBAL LIGATION        OB History    Gravida  3   Para  3   Term  3   Preterm  0   AB  0   Living  3     SAB  0   IAB  0   Ectopic  0   Multiple  0   Live Births              Family History  Problem Relation Age of Onset  . Hypertension Father   . Heart disease Father   . Gout Father   . Osteoporosis Mother   . Cancer Other     Social History   Tobacco Use  . Smoking status: Never Smoker  . Smokeless tobacco: Never Used  Substance Use Topics  . Alcohol use: No  . Drug use: Yes    Types: Marijuana    Home Medications Prior to Admission medications   Medication Sig Start Date End Date Taking? Authorizing Provider  busPIRone (BUSPAR) 10 MG tablet Take 1 tablet (10 mg total) by mouth daily. 04/25/20   Shanna Cisco, NP  cetirizine (ALLERGY RELIEF CETIRIZINE) 10 MG tablet Take 1 tablet (10 mg total) by mouth daily. 12/18/19   Lorelee New, PA-C  hydrOXYzine (ATARAX/VISTARIL) 25 MG tablet Take 1 tablet (25 mg total) by mouth 4 (four) times daily as needed. 04/25/20   Shanna Cisco,  NP  Multiple Vitamin (MULTIVITAMIN) tablet Take 1 tablet by mouth daily.    [provider]  omeprazole (PRILOSEC) 20 MG capsule Take 1 capsule (20 mg total) by mouth 2 (two) times daily before a meal. 12/18/19   Chilton Si, Sharion Settler, PA-C  pantoprazole (PROTONIX) 20 MG tablet Take 1 tablet (20 mg total) by mouth daily. 01/16/18   Loren Racer, MD  pravastatin (PRAVACHOL) 40 MG tablet Take 1 tablet (40 mg total) by mouth every evening. 01/21/17 08/08/19  Bing Neighbors, FNP  QUEtiapine (SEROQUEL) 50 MG tablet Take 1 tablet (50 mg total) by mouth at bedtime. 04/25/20   Shanna Cisco, NP    Allergies    Aspirin, Compazine [prochlorperazine edisylate], and Effexor [venlafaxine]  Review of Systems   Review of Systems  Constitutional: Negative for chills and fever.  HENT: Negative for congestion and rhinorrhea.   Eyes: Negative for redness and visual disturbance.   Respiratory: Positive for shortness of breath. Negative for wheezing.   Cardiovascular: Positive for chest pain. Negative for palpitations.  Gastrointestinal: Negative for nausea and vomiting.  Genitourinary: Negative for dysuria and urgency.  Musculoskeletal: Negative for arthralgias and myalgias.  Skin: Negative for pallor and wound.  Neurological: Negative for dizziness and headaches.    Physical Exam Updated Vital Signs BP (!) 141/88 (BP Location: Left Arm)   Pulse 76   Temp 97.8 F (36.6 C) (Oral)   Resp 10   SpO2 99%   Physical Exam Vitals and nursing note reviewed.  Constitutional:      General: She is not in acute distress.    Appearance: She is well-developed. She is not diaphoretic.  HENT:     Head: Normocephalic and atraumatic.  Eyes:     Pupils: Pupils are equal, round, and reactive to light.  Cardiovascular:     Rate and Rhythm: Normal rate and regular rhythm.     Heart sounds: No murmur heard. No friction rub. No gallop.   Pulmonary:     Effort: Pulmonary effort is normal.     Breath sounds: No wheezing or rales.  Abdominal:     General: There is no distension.     Palpations: Abdomen is soft.     Tenderness: There is abdominal tenderness.     Comments: Pain to the left chest wall about the left medial clavicular line about ribs 4 through 6 reproduces her pain.  Musculoskeletal:        General: No tenderness.     Cervical back: Normal range of motion and neck supple.  Skin:    General: Skin is warm and dry.  Neurological:     Mental Status: She is alert and oriented to person, place, and time.  Psychiatric:        Behavior: Behavior normal.     ED Results / Procedures / Treatments   Labs (all labs ordered are listed, but only abnormal results are displayed) Labs Reviewed  CBC WITH DIFFERENTIAL/PLATELET - Abnormal; Notable for the following components:      Result Value   WBC 14.4 (*)    Platelets 425 (*)    Neutro Abs 8.0 (*)    Lymphs Abs  5.5 (*)    All other components within normal limits  BASIC METABOLIC PANEL - Abnormal; Notable for the following components:   Potassium 3.3 (*)    All other components within normal limits  TROPONIN I (HIGH SENSITIVITY)    EKG EKG Interpretation  Date/Time:  Sunday September 02 2020  22:57:33 EDT Ventricular Rate:  80 PR Interval:    QRS Duration: 96 QT Interval:  393 QTC Calculation: 454 R Axis:   52 Text Interpretation: Sinus rhythm Ventricular premature complex 12 Lead; Mason-Likar No significant change since last tracing Confirmed by Melene Plan [phone]) on 09/02/2020 11:08:38 PM   Radiology DG Chest 2 View  Result Date: 09/02/2020 CLINICAL DATA:  Shortness of breath EXAM: CHEST - 2 VIEW COMPARISON:  01/15/2018 FINDINGS: The heart size and mediastinal contours are within normal limits. Both lungs are clear. The visualized skeletal structures are unremarkable. IMPRESSION: No active cardiopulmonary disease. Electronically Signed   By: Jasmine Pang M.D.   On: 09/02/2020 23:16    Procedures Procedures   Medications Ordered in ED Medications  ketorolac (TORADOL) injection 15 mg (15 mg Intramuscular Given 09/03/20 0256)    ED Course  I have reviewed the triage vital signs and the nursing notes.  Pertinent labs & imaging results that were available during my care of the patient were reviewed by me and considered in my medical decision making (see chart for details).    MDM Rules/Calculators/A&P                          45 yo F with a chief complaints of chest pain.  Atypical in nature and reproduced on exam.  Unchanged in the past 6 hours.  Will obtain a single troponin.  Chest x-ray reviewed by me without focal infiltrate or pneumothorax.  EKG without concerning finding.  labwork unremarkable, no significant anemia, trop negative.  D/c home.   3:07 AM:  I have discussed the diagnosis/risks/treatment options with the patient and believe the pt to be eligible for discharge home  to follow-up with PCP. We also discussed returning to the ED immediately if new or worsening sx occur. We discussed the sx which are most concerning (e.g., sudden worsening pain, fever, inability to tolerate by mouth) that necessitate immediate return. Medications administered to the patient during their visit and any new prescriptions provided to the patient are listed below.  Medications given during this visit Medications  ketorolac (TORADOL) injection 15 mg (15 mg Intramuscular Given 09/03/20 0256)     The patient appears reasonably screen and/or stabilized for discharge and I doubt any other medical condition or other [facility] requiring further screening, evaluation, or treatment in the ED at this time prior to discharge.   Final Clinical Impression(s) / ED Diagnoses Final diagnoses:  Nonspecific chest pain    Rx / DC Orders ED Discharge Orders    None       Melene Plan, DO 09/03/20 [id number]

## 2020-09-03 LAB — BASIC METABOLIC PANEL
Anion gap: 9 (ref 5–15)
BUN: 13 mg/dL (ref 6–20)
CO2: 28 mmol/L (ref 22–32)
Calcium: 9.6 mg/dL (ref 8.9–10.3)
Chloride: 103 mmol/L (ref 98–111)
Creatinine, Ser: 0.72 mg/dL (ref 0.44–1.00)
GFR, Estimated: >60 mL/min (ref >60)
Glucose, Bld: 97 mg/dL (ref 70–99)
Potassium: 3.3 mmol/L — ABNORMAL LOW (ref 3.5–5.1)
Sodium: 140 mmol/L (ref 135–145)

## 2020-09-03 LAB — CBC WITH DIFFERENTIAL/PLATELET
Abs Immature Granulocytes: 0.02 10*3/uL (ref 0.00–0.07)
Basophils Absolute: 0 10*3/uL (ref 0.0–0.1)
Basophils Relative: 0 %
Eosinophils Absolute: 0.2 10*3/uL (ref 0.0–0.5)
Eosinophils Relative: 1 %
HCT: 43.4 % (ref 36.0–46.0)
Hemoglobin: 14.7 g/dL (ref 12.0–15.0)
Immature Granulocytes: 0 %
Lymphocytes Relative: 38 %
Lymphs Abs: 5.5 10*3/uL — ABNORMAL HIGH (ref 0.7–4.0)
MCH: 30.8 pg (ref 26.0–34.0)
MCHC: 33.9 g/dL (ref 30.0–36.0)
MCV: 90.8 fL (ref 80.0–100.0)
Monocytes Absolute: 0.7 10*3/uL (ref 0.1–1.0)
Monocytes Relative: 5 %
Neutro Abs: 8 10*3/uL — ABNORMAL HIGH (ref 1.7–7.7)
Neutrophils Relative %: 56 %
Platelets: 425 10*3/uL — ABNORMAL HIGH (ref 150–400)
RBC: 4.78 MIL/uL (ref 3.87–5.11)
RDW: 13.2 % (ref 11.5–15.5)
WBC: 14.4 10*3/uL — ABNORMAL HIGH (ref 4.0–10.5)
nRBC: 0 % (ref 0.0–0.2)

## 2020-09-03 LAB — TROPONIN I (HIGH SENSITIVITY): Troponin I (High Sensitivity): 4 ng/L (ref <18)

## 2020-09-03 MED ORDER — KETOROLAC TROMETHAMINE 60 MG/2ML IM SOLN
15.0000 mg | Freq: Once | INTRAMUSCULAR | Status: AC
  Administered 2020-09-03: 15 mg via INTRAMUSCULAR
  Filled 2020-09-03: qty 2

## 2020-09-03 NOTE — Discharge Instructions (Signed)
Take 4 over the counter ibuprofen tablets 3 times a day or 2 over-the-counter naproxen tablets twice a day for pain. Also take tylenol 1000mg(2 extra strength) four times a day.    

## 2020-09-19 ENCOUNTER — Encounter (HOSPITAL_COMMUNITY): Payer: Self-pay

## 2020-09-19 ENCOUNTER — Ambulatory Visit (HOSPITAL_COMMUNITY)
Admission: EM | Admit: 2020-09-19 | Discharge: 2020-09-19 | Disposition: A | Discharge status: Home / Self Care | Payer: Medicaid Other | Attending: Emergency Medicine | Admitting: Emergency Medicine

## 2020-09-19 ENCOUNTER — Other Ambulatory Visit: Payer: Self-pay

## 2020-09-19 DIAGNOSIS — K029 Dental caries, unspecified: Secondary | ICD-10-CM

## 2020-09-19 MED ORDER — AMOXICILLIN 875 MG PO TABS: 875.0000 mg | ORAL_TABLET | Freq: Two times a day (BID) | ORAL | 0 refills | Status: AC

## 2020-09-19 MED ORDER — IBUPROFEN 800 MG PO TABS: 800.0000 mg | ORAL_TABLET | Freq: Three times a day (TID) | ORAL | 0 refills | Status: DC | PRN

## 2020-09-19 NOTE — ED Triage Notes (Addendum)
Pt in with c/o dental pain that has been going on for 1 month. States she is unsure if it is a tooth or her gumline Pt states pain moves from the front of her mouth to the back and also causes headaches   Pt states she has been taking ibuprofen for sxs with no relief

## 2020-09-19 NOTE — ED Provider Notes (Signed)
[facility]    CSN: [id number] Arrival date & time: 09/19/20  1537      History   Chief Complaint Chief Complaint  Patient presents with  . Dental Pain    HPI Alicia Parks is a 45 y.o. female.   Patient presents with toothache and gum pain on her left lower side x1 month.  She denies fever, chills, difficulty swallowing, or other symptoms.  Patient was seen at [facility] ED on 08/01/2020; diagnosed with pain due to dental caries; treated with penicillin and instructed to follow-up with a dentist.  History includes bipolar 1 disorder, depression, anxiety, migraine headaches, fibromyalgia, restless leg syndrome, GERD, back pain.    The history is provided by the patient and medical records.    Past Medical History:  Diagnosis Date  . Back pain   . Complication of anesthesia   . Ear drum perforation   . Fibromyalgia   . GERD (gastroesophageal reflux disease)   . Migraine   . Restless leg syndrome     Patient Active Problem List   Diagnosis Date Noted  . Generalized anxiety disorder 04/25/2020  . Bipolar I disorder, most recent episode depressed (HCC) 04/25/2020  . Moderate episode of recurrent major depressive disorder (HCC) 04/25/2020    Past Surgical History:  Procedure Laterality Date  . TONSILLECTOMY    . TUBAL LIGATION      OB History    Gravida  3   Para  3   Term  3   Preterm  0   AB  0   Living  3     SAB  0   IAB  0   Ectopic  0   Multiple  0   Live Births               Home Medications    Prior to Admission medications   Medication Sig Start Date End Date Taking? Authorizing Provider  amoxicillin (AMOXIL) 875 MG tablet Take 1 tablet (875 mg total) by mouth 2 (two) times daily for 7 days. 09/19/20 09/26/20 Yes Mickie Bail, NP  ibuprofen (ADVIL) 800 MG tablet Take 1 tablet (800 mg total) by mouth every 8 (eight) hours as needed. 09/19/20  Yes Mickie Bail, NP  QUEtiapine (SEROQUEL) 50 MG tablet Take 1 tablet (50 mg  total) by mouth at bedtime. 04/25/20  Yes Toy Cookey E, NP  busPIRone (BUSPAR) 10 MG tablet Take 1 tablet (10 mg total) by mouth daily. 04/25/20   Shanna Cisco, NP  cetirizine (ALLERGY RELIEF CETIRIZINE) 10 MG tablet Take 1 tablet (10 mg total) by mouth daily. 12/18/19   Lorelee New, PA-C  hydrOXYzine (ATARAX/VISTARIL) 25 MG tablet Take 1 tablet (25 mg total) by mouth 4 (four) times daily as needed. 04/25/20   Shanna Cisco, NP  Multiple Vitamin (MULTIVITAMIN) tablet Take 1 tablet by mouth daily.    [provider]  omeprazole (PRILOSEC) 20 MG capsule Take 1 capsule (20 mg total) by mouth 2 (two) times daily before a meal. 12/18/19   Chilton Si, Sharion Settler, PA-C  pantoprazole (PROTONIX) 20 MG tablet Take 1 tablet (20 mg total) by mouth daily. 01/16/18   Loren Racer, MD  pravastatin (PRAVACHOL) 40 MG tablet Take 1 tablet (40 mg total) by mouth every evening. 01/21/17 08/08/19  Bing Neighbors, FNP    Family History Family History  Problem Relation Age of Onset  . Hypertension Father   . Heart disease Father   .  Gout Father   . Osteoporosis Mother   . Cancer Other     Social History Social History   Tobacco Use  . Smoking status: Never Smoker  . Smokeless tobacco: Never Used  Substance Use Topics  . Alcohol use: No  . Drug use: Yes    Types: Marijuana     Allergies   Aspirin, Compazine [prochlorperazine edisylate], and Effexor [venlafaxine]   Review of Systems Review of Systems  Constitutional: Negative for chills and fever.  HENT: Positive for dental problem. Negative for ear pain, sore throat, trouble swallowing and voice change.   Eyes: Negative for pain and visual disturbance.  Respiratory: Negative for cough and shortness of breath.   Cardiovascular: Negative for chest pain and palpitations.  Gastrointestinal: Negative for abdominal pain, diarrhea and vomiting.  Genitourinary: Negative for dysuria and hematuria.  Musculoskeletal: Negative  for arthralgias and back pain.  Skin: Negative for color change and rash.  Neurological: Negative for seizures and syncope.  All other systems reviewed and are negative.    Physical Exam Triage Vital Signs ED Triage Vitals  Enc Vitals Group     BP      Pulse      Resp      Temp      Temp src      SpO2      Weight      Height      Head Circumference      Peak Flow      Pain Score      Pain Loc      Pain Edu?      Excl. in GC?    No data found.  Updated Vital Signs BP 136/88   Pulse 87   Temp 98.8 F (37.1 C)   Resp 20   LMP 08/27/2020 (Approximate)   SpO2 99%   Visual Acuity Right Eye Distance:   Left Eye Distance:   Bilateral Distance:    Right Eye Near:   Left Eye Near:    Bilateral Near:     Physical Exam Vitals and nursing note reviewed.  Constitutional:      General: She is not in acute distress.    Appearance: She is well-developed. She is not ill-appearing.  HENT:     Head: Normocephalic and atraumatic.     Mouth/Throat:     Mouth: Mucous membranes are moist.     Dentition: Dental tenderness and dental caries present.   Eyes:     Conjunctiva/sclera: Conjunctivae normal.  Cardiovascular:     Rate and Rhythm: Normal rate and regular rhythm.     Heart sounds: Normal heart sounds.  Pulmonary:     Effort: Pulmonary effort is normal. No respiratory distress.     Breath sounds: Normal breath sounds.  Abdominal:     Palpations: Abdomen is soft.     Tenderness: There is no abdominal tenderness.  Musculoskeletal:     Cervical back: Neck supple.  Skin:    General: Skin is warm and dry.  Neurological:     General: No focal deficit present.     Mental Status: She is alert and oriented to person, place, and time.     Gait: Gait normal.  Psychiatric:        Mood and Affect: Mood normal.        Behavior: Behavior normal.      UC Treatments / Results  Labs (all labs ordered are listed, but only abnormal results are displayed) Labs Reviewed -  No data to display  EKG   Radiology No results found.  Procedures Procedures (including critical care time)  Medications Ordered in UC Medications - No data to display  Initial Impression / Assessment and Plan / UC Course  I have reviewed the triage vital signs and the nursing notes.  Pertinent labs & imaging results that were available during my care of the patient were reviewed by me and considered in my medical decision making (see chart for details).   Dental pain due to dental caries.  Treating with amoxicillin and ibuprofen.  Dental resource guide provided and patient instructed to follow-up with a dentist soon as possible.  She states she has an appointment on 09/24/2020.  ED precautions discussed.  Patient agrees to plan of care.   Final Clinical Impressions(s) / UC Diagnoses   Final diagnoses:  Pain due to dental caries     Discharge Instructions     Take the antibiotic as prescribed.    A dental resource guide is attached.  Please call to make an appointment with a dentist as soon as possible.    Go to the emergency department if you have acute worsening symptoms.        ED Prescriptions    Medication Sig Dispense Auth. Provider   amoxicillin (AMOXIL) 875 MG tablet Take 1 tablet (875 mg total) by mouth 2 (two) times daily for 7 days. 14 tablet Wendee Beavers H, NP   ibuprofen (ADVIL) 800 MG tablet Take 1 tablet (800 mg total) by mouth every 8 (eight) hours as needed. 21 tablet Mickie Bail, NP     I have reviewed the PDMP during this encounter.   Mickie Bail, NP 09/19/20 [phone]

## 2020-09-19 NOTE — Discharge Instructions (Signed)
Take the antibiotic as prescribed.    A dental resource guide is attached.  Please call to make an appointment with a dentist as soon as possible.    Go to the emergency department if you have acute worsening symptoms.     

## 2020-10-02 ENCOUNTER — Other Ambulatory Visit: Payer: Self-pay

## 2020-10-02 MED FILL — Quetiapine Fumarate Tab 50 MG: ORAL | 30 days supply | Qty: 30 | Fill #0 | Status: AC

## 2020-10-03 ENCOUNTER — Other Ambulatory Visit: Payer: Self-pay

## 2020-10-03 ENCOUNTER — Encounter: Payer: Self-pay | Admitting: Family Medicine

## 2020-10-03 ENCOUNTER — Ambulatory Visit
Admission: EM | Admit: 2020-10-03 | Discharge: 2020-10-03 | Disposition: A | Discharge status: Home / Self Care | Payer: Medicaid Other | Attending: Family Medicine | Admitting: Family Medicine

## 2020-10-03 DIAGNOSIS — K047 Periapical abscess without sinus: Secondary | ICD-10-CM

## 2020-10-03 MED ORDER — CLINDAMYCIN HCL 300 MG PO CAPS: 300.0000 mg | ORAL_CAPSULE | Freq: Three times a day (TID) | ORAL | 0 refills | Status: AC

## 2020-10-03 MED ORDER — IBUPROFEN 800 MG PO TABS: 800.0000 mg | ORAL_TABLET | Freq: Two times a day (BID) | ORAL | 0 refills | Status: DC | PRN

## 2020-10-03 NOTE — ED Provider Notes (Signed)
[name]    CSN: [id number] Arrival date & time: 10/03/20  1727      History   Chief Complaint Chief Complaint  Patient presents with  . Dental Pain    HPI Alicia Parks is a 45 y.o. female.   HPI Patient presents for chronic recurrent dental pain.  Patient was seen in clinic on 30 March and treated with a round of amoxicillin.  She was also seen in the ER in early February for the same problem and treated with penicillin G.  Patient has utilized dental resources however was unable to secure an appointment until August at a discount dental clinic.  She reports after finishing the amoxicillin few days later pain returned and she is also having some mild swelling of the face.  She has multiple dental caries and has needed dental care for quite some time.  Denies fever, nausea or vomiting. Past Medical History:  Diagnosis Date  . Back pain   . Complication of anesthesia   . Ear drum perforation   . Fibromyalgia   . GERD (gastroesophageal reflux disease)   . Migraine   . Restless leg syndrome     Patient Active Problem List   Diagnosis Date Noted  . Generalized anxiety disorder 04/25/2020  . Bipolar I disorder, most recent episode depressed (HCC) 04/25/2020  . Moderate episode of recurrent major depressive disorder (HCC) 04/25/2020    Past Surgical History:  Procedure Laterality Date  . TONSILLECTOMY    . TUBAL LIGATION      OB History    Gravida  3   Para  3   Term  3   Preterm  0   AB  0   Living  3     SAB  0   IAB  0   Ectopic  0   Multiple  0   Live Births               Home Medications    Prior to Admission medications   Medication Sig Start Date End Date Taking? Authorizing Provider  clindamycin (CLEOCIN) 300 MG capsule Take 1 capsule (300 mg total) by mouth 3 (three) times daily for 7 days. 10/03/20 10/10/20 Yes Bing Neighbors, FNP  ibuprofen (ADVIL) 800 MG tablet Take 1 tablet (800 mg total) by mouth 2 (two) times  daily as needed. 10/03/20  Yes Bing Neighbors, FNP  busPIRone (BUSPAR) 10 MG tablet TAKE 1 TABLET (10 MG TOTAL) BY MOUTH DAILY. 04/25/20 04/25/21  Shanna Cisco, NP  cetirizine (ALLERGY RELIEF CETIRIZINE) 10 MG tablet Take 1 tablet (10 mg total) by mouth daily. 12/18/19   Lorelee New, PA-C  hydrOXYzine (ATARAX/VISTARIL) 25 MG tablet TAKE 1 TABLET (25 MG TOTAL) BY MOUTH 4 (FOUR) TIMES DAILY AS NEEDED. 04/25/20 04/25/21  Toy Cookey E, NP  Multiple Vitamin (MULTIVITAMIN) tablet Take 1 tablet by mouth daily.    [provider]  omeprazole (PRILOSEC) 20 MG capsule Take 1 capsule (20 mg total) by mouth 2 (two) times daily before a meal. 12/18/19   Chilton Si, Sharion Settler, PA-C  pantoprazole (PROTONIX) 20 MG tablet Take 1 tablet (20 mg total) by mouth daily. 01/16/18   Loren Racer, MD  pravastatin (PRAVACHOL) 40 MG tablet Take 1 tablet (40 mg total) by mouth every evening. 01/21/17 08/08/19  Bing Neighbors, FNP  QUEtiapine (SEROQUEL) 50 MG tablet TAKE 1 TABLET (50 MG TOTAL) BY MOUTH AT BEDTIME. 04/25/20 04/25/21  Shanna Cisco, NP  Family History Family History  Problem Relation Age of Onset  . Hypertension Father   . Heart disease Father   . Gout Father   . Osteoporosis Mother   . Cancer Other     Social History Social History   Tobacco Use  . Smoking status: Never Smoker  . Smokeless tobacco: Never Used  Substance Use Topics  . Alcohol use: No  . Drug use: Yes    Types: Marijuana     Allergies   Aspirin, Compazine [prochlorperazine edisylate], and Effexor [venlafaxine]   Review of Systems Review of Systems Pertinent negatives listed in HPI   Physical Exam Triage Vital Signs ED Triage Vitals  Enc Vitals Group     BP      Pulse      Resp      Temp      Temp src      SpO2      Weight      Height      Head Circumference      Peak Flow      Pain Score      Pain Loc      Pain Edu?      Excl. in GC?    No data found.  Updated Vital  Signs BP 114/77 (BP Location: Left Arm)   Pulse 87   Temp 98.3 F (36.8 C) (Oral)   Resp 20   LMP 08/27/2020   SpO2 95%   Visual Acuity Right Eye Distance:   Left Eye Distance:   Bilateral Distance:    Right Eye Near:   Left Eye Near:    Bilateral Near:     Physical Exam General appearance: alert, well developed, well nourished, cooperative  Head: Normocephalic, without obvious abnormality, atraumatic Mouth:Poor dentition, gingival irritation and swelling  Respiratory: Respirations even and unlabored, normal respiratory rate Heart: rate and rhythm normal. No gallop or murmurs noted on exam  Abdomen: BS +, no distention, no rebound tenderness, or no mass Extremities: No gross deformities Skin: Skin color, texture, turgor normal. No rashes seen  Psych: Appropriate mood and affect. UC Treatments / Results  Labs (all labs ordered are listed, but only abnormal results are displayed) Labs Reviewed - No data to display  EKG   Radiology No results found.  Procedures Procedures (including critical care time)  Medications Ordered in UC Medications - No data to display  Initial Impression / Assessment and Plan / UC Course  I have reviewed the triage vital signs and the nursing notes.  Pertinent labs & imaging results that were available during my care of the patient were reviewed by me and considered in my medical decision making (see chart for details).     Chronic dental infection will try short course of clindamycin and refilled ibuprofen 800 mg.  Provided a list of dental resources advised patient to continue to try to see if she can obtain an appointment sooner than the August appointment.  Advised to thoroughly cleanse her teeth and she is already avoiding eating foods that are irritating.  ER/return precautions given. Final Clinical Impressions(s) / UC Diagnoses   Final diagnoses:  Chronic dental infection   Discharge Instructions   None    ED Prescriptions     Medication Sig Dispense Auth. Provider   clindamycin (CLEOCIN) 300 MG capsule Take 1 capsule (300 mg total) by mouth 3 (three) times daily for 7 days. 21 capsule Bing Neighbors, FNP   ibuprofen (ADVIL) 800 MG tablet Take  1 tablet (800 mg total) by mouth 2 (two) times daily as needed. 21 tablet Bing Neighbors, FNP     PDMP not reviewed this encounter.   Bing Neighbors, FNP 10/03/20 [id number]

## 2020-10-03 NOTE — ED Triage Notes (Signed)
Pt c/o lt lower teeth/gum pain since march. States having swelling to lt lower face.

## 2020-10-04 ENCOUNTER — Other Ambulatory Visit: Payer: Self-pay

## 2020-10-04 ENCOUNTER — Encounter: Payer: Self-pay | Admitting: Critical Care Medicine

## 2020-10-04 ENCOUNTER — Ambulatory Visit: Payer: Self-pay | Attending: Critical Care Medicine | Admitting: Critical Care Medicine

## 2020-10-04 DIAGNOSIS — K219 Gastro-esophageal reflux disease without esophagitis: Secondary | ICD-10-CM

## 2020-10-04 DIAGNOSIS — F331 Major depressive disorder, recurrent, moderate: Secondary | ICD-10-CM

## 2020-10-04 DIAGNOSIS — K047 Periapical abscess without sinus: Secondary | ICD-10-CM | POA: Insufficient documentation

## 2020-10-04 MED ORDER — TRAMADOL HCL 50 MG PO TABS
50.0000 mg | ORAL_TABLET | Freq: Four times a day (QID) | ORAL | 0 refills | Status: DC | PRN
  Filled 2020-10-04: qty 21, 6d supply, fill #0

## 2020-10-04 MED ORDER — PANTOPRAZOLE SODIUM 40 MG PO TBEC
40.0000 mg | DELAYED_RELEASE_TABLET | Freq: Every day | ORAL | 3 refills | Status: DC
  Filled 2020-10-04: qty 30, 30d supply, fill #0

## 2020-10-04 NOTE — Assessment & Plan Note (Signed)
Abscessed left lower wisdom tooth we will continue clindamycin give tramadol as needed see if we can get her into a dental clinic using the patient assistance fund

## 2020-10-04 NOTE — Assessment & Plan Note (Signed)
Stable at this time continue follow-up with behavioral health

## 2020-10-04 NOTE — Assessment & Plan Note (Signed)
Refill reflux medicine

## 2020-10-04 NOTE — Progress Notes (Addendum)
Subjective:    Patient ID: Alicia Parks, female    DOB: 1975/09/24, 45 y.o.   MRN: [id number] Virtual Visit via Video Note  I connected with@ on 10/04/20 at@ by a video enabled telemedicine application and verified that I am speaking with the correct person using two identifiers.   Consent:  I discussed the limitations, risks, security and privacy concerns of performing an evaluation and management service by video visit and the availability of in person appointments. I also discussed with the patient that there may be a patient responsible charge related to this service. The patient expressed understanding and agreed to proceed.  Location of patient: Patient's at home in her bathroom Location of provider: I am in my office  Persons participating in the televisit with the patient.   No one else on the call    History of Present Illness:  45 y.o.F Former fulp pt.    10/04/2020 This is a pleasant 45 year old female seen by way of a video visit here to establish for primary care.  She has been going to the urgent care and emergency room on a constant basis of the past several months because of chronic recurrent left lower jaw recurrent dental infections.  She has an impacted molar and has been abscessed.  She just went to urgent care yesterday received another course of clindamycin which she is starting and high-dose ibuprofen.  The ibuprofen does not help completely.  She awakens in melanite with severe pain in the jaw.  She is not able to afford dental care as she has no insurance.  She has tried to get an appointment in the early she can get in a free dental clinic is up in [place] in August.  Patient does have   Patient does have recurrent reflux disease and is requesting refills on her proton pump inhibitor  Patient with chronic bipolar condition and depression and generalized anxiety disorder she is followed by the behavioral health clinic and has medications and is taking these  and is stable at this time from a mental health perspective    Past Medical History:  Diagnosis Date  . Back pain   . Complication of anesthesia   . Ear drum perforation   . Fibromyalgia   . GERD (gastroesophageal reflux disease)   . Migraine   . Restless leg syndrome      Family History  Problem Relation Age of Onset  . Hypertension Father   . Heart disease Father   . Gout Father   . Osteoporosis Mother   . Cancer Other      Social History   Socioeconomic History  . Marital status: Married    Spouse name: Not on file  . Number of children: Not on file  . Years of education: Not on file  . Highest education level: Not on file  Occupational History  . Not on file  Tobacco Use  . Smoking status: Never Smoker  . Smokeless tobacco: Never Used  Substance and Sexual Activity  . Alcohol use: No  . Drug use: Yes    Types: Marijuana  . Sexual activity: Yes    Birth control/protection: Surgical  Other Topics Concern  . Not on file  Social History Narrative  . Not on file   Social Determinants of Health   Financial Resource Strain: Not on file  Food Insecurity: Not on file  Transportation Needs: Not on file  Physical Activity: Not on file  Stress: Not on file  Social Connections: Not on file  Intimate Partner Violence: Not on file     Allergies  Allergen Reactions  . Aspirin Other (See Comments)    "gives me the jitters"  . Compazine [Prochlorperazine Edisylate] Other (See Comments)    Makes body move funny. Shaking  . Effexor [Venlafaxine]     Nightmares and night sweats     Outpatient Medications Prior to Visit  Medication Sig Dispense Refill  . busPIRone (BUSPAR) 10 MG tablet TAKE 1 TABLET (10 MG TOTAL) BY MOUTH DAILY. 90 tablet 2  . clindamycin (CLEOCIN) 300 MG capsule Take 1 capsule (300 mg total) by mouth 3 (three) times daily for 7 days. 21 capsule 0  . hydrOXYzine (ATARAX/VISTARIL) 25 MG tablet TAKE 1 TABLET (25 MG TOTAL) BY MOUTH 4 (FOUR) TIMES  DAILY AS NEEDED. 90 tablet 2  . ibuprofen (ADVIL) 800 MG tablet Take 1 tablet (800 mg total) by mouth 2 (two) times daily as needed. 21 tablet 0  . QUEtiapine (SEROQUEL) 50 MG tablet TAKE 1 TABLET (50 MG TOTAL) BY MOUTH AT BEDTIME. 30 tablet 2  . cetirizine (ALLERGY RELIEF CETIRIZINE) 10 MG tablet Take 1 tablet (10 mg total) by mouth daily. (Patient not taking: Reported on 10/04/2020) 30 tablet 1  . Multiple Vitamin (MULTIVITAMIN) tablet Take 1 tablet by mouth daily. (Patient not taking: Reported on 10/04/2020)    . omeprazole (PRILOSEC) 20 MG capsule Take 1 capsule (20 mg total) by mouth 2 (two) times daily before a meal. (Patient not taking: Reported on 10/04/2020) 60 capsule 1  . pantoprazole (PROTONIX) 20 MG tablet Take 1 tablet (20 mg total) by mouth daily. (Patient not taking: Reported on 10/04/2020) 30 tablet 0  . pravastatin (PRAVACHOL) 40 MG tablet Take 1 tablet (40 mg total) by mouth every evening. 30 tablet 11   No facility-administered medications prior to visit.    Review of Systems  HENT: Positive for dental problem.   Respiratory: Negative.   Cardiovascular: Negative.   Gastrointestinal:       GERD  Genitourinary: Negative.   Musculoskeletal: Negative.   Psychiatric/Behavioral: The patient is nervous/anxious.        Objective:   Physical Exam Patient seen in the video is in no distress       Assessment & Plan:  I personally reviewed all images and lab data in the [facility] system as well as any outside material available during this office visit and agree with the  radiology impressions.   Moderate episode of recurrent major depressive disorder (HCC) Stable at this time continue follow-up with behavioral health  Dental abscess Abscessed left lower wisdom tooth we will continue clindamycin give tramadol as needed see if we can get her into a dental clinic using the patient assistance fund  GERD (gastroesophageal reflux disease) Refill reflux medicine   Jaidence was seen  today for establish care.  Diagnoses and all orders for this visit:  Moderate episode of recurrent major depressive disorder (HCC)  Dental abscess  Gastroesophageal reflux disease without esophagitis  Other orders -     traMADol (ULTRAM) 50 MG tablet; Take 1 tablet (50 mg total) by mouth every 6 (six) hours as needed for up to 7 days. -     pantoprazole (PROTONIX) 40 MG tablet; Take 1 tablet (40 mg total) by mouth daily.     Follow Up Instructions: Patient knows we will attempt to obtain financial assistance for her to see a dentist I gave her a short course of tramadol for  pain she is to finish her current course of clindamycin Patient was given refills on her proton pump inhibitor and she knows a face-to-face exam will be scheduled in May with our office  I spent 35 minutes reviewing the patient's records during this visit face-to-face and nonface-to-face  Complex medical decision making occur   I discussed the assessment and treatment plan with the patient. The patient was provided an opportunity to ask questions and all were answered. The patient agreed with the plan and demonstrated an understanding of the instructions.   The patient was advised to call back or seek an in-person evaluation if the symptoms worsen or if the condition fails to improve as anticipated.  I provided 35 minutes of non-face-to-face time during this encounter  including  median intraservice time , review of notes, labs, imaging, medications  and explaining diagnosis and management to the patient .    Shan Levans, MD

## 2020-10-08 ENCOUNTER — Telehealth: Payer: Self-pay | Admitting: Critical Care Medicine

## 2020-10-08 ENCOUNTER — Other Ambulatory Visit: Payer: Self-pay

## 2020-10-08 NOTE — Telephone Encounter (Signed)
Patient is calling Erskine Squibb back. Please advise CB- [phone]

## 2020-10-08 NOTE — Telephone Encounter (Signed)
Call returned to patient to discuss scheduling an appointment at the mobile dental unit in [facility] 10/13/2020.  The patient does not have dental insurance and this is a free service.  She will be seen by a dentist and she stated that she has transportation to [place] where the service will be available. Confirmed her phone number and email and provided this information to Vernona Rieger [facility] to schedule the appointment.

## 2020-10-09 ENCOUNTER — Other Ambulatory Visit: Payer: Self-pay

## 2020-10-10 ENCOUNTER — Telehealth: Payer: Self-pay | Admitting: Critical Care Medicine

## 2020-10-10 NOTE — Telephone Encounter (Signed)
Copied from CRM [id number]. Topic: General - Other >> Oct 10, 2020 12:06 PM Jaquita Rector A wrote: Reason for CRM: Patient called in asking if Erskine Squibb can please give her a call in reference to a dental appointment can be reached at Ph# [phone]

## 2020-10-10 NOTE — Telephone Encounter (Signed)
Call from patient.  She said that she did not receive an email about the dental  appointment 10/13/2020. This CM provided her with the address and time of the appointment.  email sent to [facility] informing her that the patient did not receive any information about the appointment.   She asked what will be done at the appointment - if they don't do xrays, how can they extract a tooth?   she wanted to know why she can't go to [facility].   This CM explained that this is a free assessment and we need to start with the dental evaluation.

## 2020-10-11 NOTE — Telephone Encounter (Signed)
As per Vern[facility] Rie[facility], the patient is confirmed on the schedule with the dental bus 10/13/2020 1430-1530.  Call placed to the patient and she said she has still not received an email confirmation of the appointment and she has checked her spam. Instructed her to keep the appointment as scheduled and she said that she would.  email sent to Esther Hardy again informing her that the patient has not received email confirmation of the appointment

## 2020-10-15 ENCOUNTER — Telehealth: Payer: Self-pay

## 2020-10-15 NOTE — Telephone Encounter (Signed)
Noted! Thank you

## 2020-10-15 NOTE — Telephone Encounter (Signed)
Call placed to patient as follow up to her dental appointment 10/13/2020.   She said she had a " big tooth" on the right side of her mouth pulled and is feeling better. She said the tooth had a "hole and cavity"    She explained that she was screaming and crying in pain and the dentist was not sympathetic.  She said that she still can't chew on the left side of her mouth and continues to have some difficulty on the right side due to the extraction.  She needs to have a wisdom tooth extracted and will need to be seen by an oral surgeon.  I am sending her a financial assistance packet and she can apply for [employer]/ [employer].    Overall, she is very pleased that the tooth is out.

## 2020-10-15 NOTE — Telephone Encounter (Signed)
Patient already spoke with Erskine Squibb about her dental appointment.

## 2020-10-15 NOTE — Telephone Encounter (Addendum)
Pt states she was to call back and let Erskine Squibb know how her dental visit went. CB for Erskine Squibb at [phone]

## 2020-10-17 ENCOUNTER — Telehealth: Payer: Self-pay | Admitting: Critical Care Medicine

## 2020-10-17 NOTE — Telephone Encounter (Signed)
Medication that has been requested is not on current medication list. Please advise  

## 2020-10-17 NOTE — Telephone Encounter (Signed)
Copied from CRM [id number]. Topic: Quick Communication - Rx Refill/Question >> Oct 17, 2020  1:51 PM Jaquita Rector A wrote: Medication: traMADol (ULTRAM) 50 MG tablet   Has the patient contacted their pharmacy? Yes.   (Agent: If no, request that the patient contact the pharmacy for the refill.) (Agent: If yes, when and what did the pharmacy advise?)  Preferred Pharmacy (with phone number or street name): [facility]  Phone:  [phone] Fax:  [phone]     Agent: Please be advised that RX refills may take up to 3 business days. We ask that you follow-up with your pharmacy.

## 2020-10-18 NOTE — Telephone Encounter (Signed)
Patient is completely out and had a tooth pool on Saturday 10/13/2020 checking on the status and would like call best # [phone]

## 2020-10-19 ENCOUNTER — Other Ambulatory Visit: Payer: Self-pay

## 2020-10-19 ENCOUNTER — Other Ambulatory Visit: Payer: Self-pay | Admitting: Critical Care Medicine

## 2020-10-19 NOTE — Telephone Encounter (Signed)
Requested medication (s) are due for refill today:   Requested medication (s) are on the active medication list: No  Last refill:  10/04/20  Future visit scheduled: Yes  Notes to clinic:  See request.    Requested Prescriptions  Pending Prescriptions Disp Refills   traMADol (ULTRAM) 50 MG tablet 21 tablet 0    Sig: Take 1 tablet (50 mg total) by mouth every 6 (six) hours as needed for up to 7 days.      Not Delegated - Analgesics:  Opioid Agonists Failed - 10/19/2020 11:13 AM      Failed - This refill cannot be delegated      Failed - Urine Drug Screen completed in last 360 days      Passed - Valid encounter within last 6 months    Recent Outpatient Visits           2 weeks ago Dental abscess   [facility] Storm Frisk, MD   1 year ago Fatty liver   [facility] Cain Saupe, MD       Future Appointments             In 3 weeks Delford Field Charlcie Cradle, MD [facility]

## 2020-10-19 NOTE — Telephone Encounter (Signed)
Pt following up on the status of her medication refill, please advise.

## 2020-10-19 NOTE — Telephone Encounter (Signed)
Pt is requesting refill on Tramadol last fill was 10/04/20 QTY 21 pills.   [facility] pharmacy.

## 2020-10-20 MED ORDER — TRAMADOL HCL 50 MG PO TABS
50.0000 mg | ORAL_TABLET | Freq: Four times a day (QID) | ORAL | 0 refills | Status: AC | PRN
  Filled 2020-10-20: qty 21, 6d supply, fill #0

## 2020-10-21 NOTE — Telephone Encounter (Signed)
Refills sent by dr Alvis Lemmings

## 2020-10-22 ENCOUNTER — Other Ambulatory Visit: Payer: Self-pay

## 2020-10-22 NOTE — Telephone Encounter (Signed)
Pt does not have VM to leave message.

## 2020-10-24 ENCOUNTER — Other Ambulatory Visit: Payer: Self-pay

## 2020-10-24 ENCOUNTER — Other Ambulatory Visit (HOSPITAL_COMMUNITY): Payer: Self-pay

## 2020-10-24 MED ORDER — CHLORHEXIDINE GLUCONATE 0.12 % MT SOLN
15.0000 mL | Freq: Two times a day (BID) | OROMUCOSAL | 0 refills | Status: DC
  Filled 2020-10-24: qty 473, 16d supply, fill #0

## 2020-10-25 ENCOUNTER — Other Ambulatory Visit (HOSPITAL_COMMUNITY): Payer: Self-pay

## 2020-11-02 ENCOUNTER — Ambulatory Visit: Payer: Medicaid Other

## 2020-11-13 ENCOUNTER — Ambulatory Visit: Payer: Medicaid Other | Admitting: Critical Care Medicine

## 2020-11-13 NOTE — Progress Notes (Deleted)
Subjective:    Patient ID: Alicia Parks, female    DOB: 1976-02-18, 45 y.o.   MRN: [id number] Virtual Visit via Video Note  I connected with@ on 11/13/20 at@ by a video enabled telemedicine application and verified that I am speaking with the correct person using two identifiers.   Consent:  I discussed the limitations, risks, security and privacy concerns of performing an evaluation and management service by video visit and the availability of in person appointments. I also discussed with the patient that there may be a patient responsible charge related to this service. The patient expressed understanding and agreed to proceed.  Location of patient: Patient's at home in her bathroom Location of provider: I am in my office  Persons participating in the televisit with the patient.   No one else on the call    History of Present Illness:  45 y.o.F Former fulp pt.    10/04/2020 This is a pleasant 45 year old female seen by way of a video visit here to establish for primary care.  She has been going to the urgent care and emergency room on a constant basis of the past several months because of chronic recurrent left lower jaw recurrent dental infections.  She has an impacted molar and has been abscessed.  She just went to urgent care yesterday received another course of clindamycin which she is starting and high-dose ibuprofen.  The ibuprofen does not help completely.  She awakens in melanite with severe pain in the jaw.  She is not able to afford dental care as she has no insurance.  She has tried to get an appointment in the early she can get in a free dental clinic is up in [place] in August.  Patient does have   Patient does have recurrent reflux disease and is requesting refills on her proton pump inhibitor  Patient with chronic bipolar condition and depression and generalized anxiety disorder she is followed by the behavioral health clinic and has medications and is taking these  and is stable at this time from a mental health perspective  11/13/2020  Moderate episode of recurrent major depressive disorder (HCC) Stable at this time continue follow-up with behavioral health  Dental abscess Abscessed left lower wisdom tooth we will continue clindamycin give tramadol as needed see if we can get her into a dental clinic using the patient assistance fund  GERD (gastroesophageal reflux disease) Refill reflux medicine     Past Medical History:  Diagnosis Date  . Back pain   . Complication of anesthesia   . Ear drum perforation   . Fibromyalgia   . GERD (gastroesophageal reflux disease)   . Migraine   . Restless leg syndrome      Family History  Problem Relation Age of Onset  . Hypertension Father   . Heart disease Father   . Gout Father   . Osteoporosis Mother   . Cancer Other      Social History   Socioeconomic History  . Marital status: Married    Spouse name: Not on file  . Number of children: Not on file  . Years of education: Not on file  . Highest education level: Not on file  Occupational History  . Not on file  Tobacco Use  . Smoking status: Never Smoker  . Smokeless tobacco: Never Used  Substance and Sexual Activity  . Alcohol use: No  . Drug use: Yes    Types: Marijuana  . Sexual activity: Yes  Birth control/protection: Surgical  Other Topics Concern  . Not on file  Social History Narrative  . Not on file   Social Determinants of Health   Financial Resource Strain: Not on file  Food Insecurity: Not on file  Transportation Needs: Not on file  Physical Activity: Not on file  Stress: Not on file  Social Connections: Not on file  Intimate Partner Violence: Not on file     Allergies  Allergen Reactions  . Aspirin Other (See Comments)    "gives me the jitters"  . Compazine [Prochlorperazine Edisylate] Other (See Comments)    Makes body move funny. Shaking  . Effexor [Venlafaxine]     Nightmares and night sweats      Outpatient Medications Prior to Visit  Medication Sig Dispense Refill  . busPIRone (BUSPAR) 10 MG tablet TAKE 1 TABLET (10 MG TOTAL) BY MOUTH DAILY. 90 tablet 2  . chlorhexidine (PERIDEX) 0.12 % solution Place 15 ml (fill to line inside cap) in the mouth 2 times per day after brushing teeth, swish in mouth for 30 seconds then spit out 473 mL 0  . hydrOXYzine (ATARAX/VISTARIL) 25 MG tablet TAKE 1 TABLET (25 MG TOTAL) BY MOUTH 4 (FOUR) TIMES DAILY AS NEEDED. 90 tablet 2  . ibuprofen (ADVIL) 800 MG tablet Take 1 tablet (800 mg total) by mouth 2 (two) times daily as needed. 21 tablet 0  . pantoprazole (PROTONIX) 40 MG tablet Take 1 tablet (40 mg total) by mouth daily. 30 tablet 3  . QUEtiapine (SEROQUEL) 50 MG tablet TAKE 1 TABLET (50 MG TOTAL) BY MOUTH AT BEDTIME. 30 tablet 2   No facility-administered medications prior to visit.    Review of Systems  HENT: Positive for dental problem.   Respiratory: Negative.   Cardiovascular: Negative.   Gastrointestinal:       GERD  Genitourinary: Negative.   Musculoskeletal: Negative.   Psychiatric/Behavioral: The patient is nervous/anxious.        Objective:   Physical Exam Patient seen in the video is in no distress       Assessment & Plan:  I personally reviewed all images and lab data in the [facility] system as well as any outside material available during this office visit and agree with the  radiology impressions.   No problem-specific Assessment & Plan notes found for this encounter.   There are no diagnoses linked to this encounter.   Follow Up Instructions: Patient knows we will attempt to obtain financial assistance for her to see a dentist I gave her a short course of tramadol for pain she is to finish her current course of clindamycin Patient was given refills on her proton pump inhibitor and she knows a face-to-face exam will be scheduled in May with our office  I spent 35 minutes reviewing the patient's records during this  visit face-to-face and nonface-to-face  Complex medical decision making occur   I discussed the assessment and treatment plan with the patient. The patient was provided an opportunity to ask questions and all were answered. The patient agreed with the plan and demonstrated an understanding of the instructions.   The patient was advised to call back or seek an in-person evaluation if the symptoms worsen or if the condition fails to improve as anticipated.  I provided 35 minutes of non-face-to-face time during this encounter  including  median intraservice time , review of notes, labs, imaging, medications  and explaining diagnosis and management to the patient .    Luisa Hart  Joya Gaskins, MD

## 2020-12-28 ENCOUNTER — Other Ambulatory Visit: Payer: Self-pay

## 2020-12-28 MED ORDER — NORETHINDRONE 0.35 MG PO TABS
ORAL_TABLET | ORAL | 0 refills | Status: DC
  Filled 2020-12-28: qty 84, 84d supply, fill #0

## 2020-12-31 ENCOUNTER — Other Ambulatory Visit: Payer: Self-pay

## 2021-01-01 ENCOUNTER — Ambulatory Visit (INDEPENDENT_AMBULATORY_CARE_PROVIDER_SITE_OTHER): Payer: BLUE CROSS/BLUE SHIELD

## 2021-01-01 ENCOUNTER — Ambulatory Visit (HOSPITAL_COMMUNITY)
Admission: EM | Admit: 2021-01-01 | Discharge: 2021-01-01 | Disposition: A | Discharge status: Home / Self Care | Payer: BLUE CROSS/BLUE SHIELD | Attending: Emergency Medicine | Admitting: Emergency Medicine

## 2021-01-01 ENCOUNTER — Other Ambulatory Visit: Payer: Self-pay

## 2021-01-01 ENCOUNTER — Encounter (HOSPITAL_COMMUNITY): Payer: Self-pay | Admitting: Emergency Medicine

## 2021-01-01 DIAGNOSIS — R1012 Left upper quadrant pain: Secondary | ICD-10-CM | POA: Insufficient documentation

## 2021-01-01 DIAGNOSIS — N390 Urinary tract infection, site not specified: Secondary | ICD-10-CM | POA: Insufficient documentation

## 2021-01-01 DIAGNOSIS — R1010 Upper abdominal pain, unspecified: Secondary | ICD-10-CM

## 2021-01-01 LAB — POCT URINALYSIS DIPSTICK, ED / UC
Bilirubin Urine: NEGATIVE
Glucose, UA: NEGATIVE mg/dL
Ketones, ur: NEGATIVE mg/dL
Nitrite: NEGATIVE
Protein, ur: NEGATIVE mg/dL
Specific Gravity, Urine: 1.02 (ref 1.005–1.030)
Urobilinogen, UA: 0.2 mg/dL (ref 0.0–1.0)
pH: 5 (ref 5.0–8.0)

## 2021-01-01 LAB — POC URINE PREG, ED: Preg Test, Ur: NEGATIVE

## 2021-01-01 MED ORDER — PHENAZOPYRIDINE HCL 200 MG PO TABS
200.0000 mg | ORAL_TABLET | Freq: Three times a day (TID) | ORAL | 0 refills | Status: DC
Start: 2021-01-01 — End: 2021-03-25
  Filled 2021-01-01: qty 6, 2d supply, fill #0

## 2021-01-01 MED ORDER — CEPHALEXIN 500 MG PO CAPS
500.0000 mg | ORAL_CAPSULE | Freq: Four times a day (QID) | ORAL | 0 refills | Status: DC
  Filled 2021-01-01: qty 20, 5d supply, fill #0

## 2021-01-01 NOTE — Discharge Instructions (Signed)
Follow up with gi if you still have pain to upper abdomen area

## 2021-01-01 NOTE — ED Provider Notes (Signed)
[facility]    CSN: [id number] Arrival date & time: 01/01/21  1106      History   Chief Complaint Chief Complaint  Patient presents with   Abdominal Pain    HPI Alicia Parks is a 45 y.o. female.   Pt has LUQ abd pain intermit for 3 days states she feels like is swelling in that area. Also has bil lower flank pain. Denies any lower abd pain, no fevers, no urinary sx. No fevers. Last BM today and was normal does not have a hx of constipation.    Past Medical History:  Diagnosis Date   Back pain    Complication of anesthesia    Ear drum perforation    Fibromyalgia    GERD (gastroesophageal reflux disease)    Migraine    Restless leg syndrome     Patient Active Problem List   Diagnosis Date Noted   Dental abscess 10/04/2020   GERD (gastroesophageal reflux disease) 10/04/2020   Generalized anxiety disorder 04/25/2020   Bipolar I disorder, most recent episode depressed (HCC) 04/25/2020   Moderate episode of recurrent major depressive disorder (HCC) 04/25/2020    Past Surgical History:  Procedure Laterality Date   TONSILLECTOMY     TUBAL LIGATION      OB History     Gravida  3   Para  3   Term  3   Preterm  0   AB  0   Living  3      SAB  0   IAB  0   Ectopic  0   Multiple  0   Live Births               Home Medications    Prior to Admission medications   Medication Sig Start Date End Date Taking? Authorizing Provider  cephALEXin (KEFLEX) 500 MG capsule Take 1 capsule (500 mg total) by mouth 4 (four) times daily. 01/01/21  Yes Coralyn Mark, NP  phenazopyridine (PYRIDIUM) 200 MG tablet Take 1 tablet (200 mg total) by mouth 3 (three) times daily. 01/01/21  Yes Maple Mirza L, NP  busPIRone (BUSPAR) 10 MG tablet TAKE 1 TABLET (10 MG TOTAL) BY MOUTH DAILY. 04/25/20 04/25/21  Shanna Cisco, NP  chlorhexidine (PERIDEX) 0.12 % solution Place 15 ml (fill to line inside cap) in the mouth 2 times per day after brushing  teeth, swish in mouth for 30 seconds then spit out 10/24/20     hydrOXYzine (ATARAX/VISTARIL) 25 MG tablet TAKE 1 TABLET (25 MG TOTAL) BY MOUTH 4 (FOUR) TIMES DAILY AS NEEDED. 04/25/20 04/25/21  Toy Cookey E, NP  ibuprofen (ADVIL) 800 MG tablet Take 1 tablet (800 mg total) by mouth 2 (two) times daily as needed. 10/03/20   Bing Neighbors, FNP  norethindrone (CAMILA) 0.35 MG tablet take 1 tablet by mouth daily 12/28/20     pantoprazole (PROTONIX) 40 MG tablet Take 1 tablet (40 mg total) by mouth daily. 10/04/20   Storm Frisk, MD  QUEtiapine (SEROQUEL) 50 MG tablet TAKE 1 TABLET (50 MG TOTAL) BY MOUTH AT BEDTIME. 04/25/20 04/25/21  Shanna Cisco, NP    Family History Family History  Problem Relation Age of Onset   Hypertension Father    Heart disease Father    Gout Father    Osteoporosis Mother    Cancer Other     Social History Social History   Tobacco Use   Smoking status: Never   Smokeless tobacco:  Never  Substance Use Topics   Alcohol use: No   Drug use: Yes    Types: Marijuana     Allergies   Aspirin, Compazine [prochlorperazine edisylate], and Effexor [venlafaxine]   Review of Systems Review of Systems  Constitutional:  Negative for activity change, appetite change and fever.  Respiratory: Negative.    Cardiovascular: Negative.   Gastrointestinal:  Positive for abdominal distention and abdominal pain. Negative for constipation and vomiting.  Genitourinary: Negative.     Physical Exam Triage Vital Signs ED Triage Vitals  Enc Vitals Group     BP 01/01/21 1248 (!) 140/107     Pulse Rate 01/01/21 1248 66     Resp 01/01/21 1248 18     Temp 01/01/21 1248 98.2 F (36.8 C)     Temp src --      SpO2 01/01/21 1248 99 %     Weight --      Height --      Head Circumference --      Peak Flow --      Pain Score 01/01/21 1246 8     Pain Loc --      Pain Edu? --      Excl. in GC? --    No data found.  Updated Vital Signs BP (!) 140/107   Pulse 66    Temp 98.2 F (36.8 C)   Resp 18   SpO2 99%   Visual Acuity     Physical Exam Constitutional:      Appearance: She is well-developed.  Cardiovascular:     Rate and Rhythm: Normal rate.  Pulmonary:     Effort: Pulmonary effort is normal.  Abdominal:     General: Abdomen is flat. Bowel sounds are normal. There are no signs of injury.     Palpations: Abdomen is soft.     Tenderness: There is abdominal tenderness in the left upper quadrant. There is right CVA tenderness and left CVA tenderness.  Neurological:     Mental Status: She is alert.     UC Treatments / Results  Labs (all labs ordered are listed, but only abnormal results are displayed) Labs Reviewed  URINE CULTURE - Abnormal; Notable for the following components:      Result Value   Culture MULTIPLE SPECIES PRESENT, SUGGEST RECOLLECTION (*)    All other components within normal limits  POCT URINALYSIS DIPSTICK, ED / UC - Abnormal; Notable for the following components:   Hgb urine dipstick SMALL (*)    Leukocytes,Ua TRACE (*)    All other components within normal limits  POC URINE PREG, ED    EKG   Radiology No results found.  Procedures Procedures (including critical care time)  Medications Ordered in UC Medications - No data to display  Initial Impression / Assessment and Plan / UC Course  I have reviewed the triage vital signs and the nursing notes.  Pertinent labs & imaging results that were available during my care of the patient were reviewed by me and considered in my medical decision making (see chart for details).     Will need to see GI for futher testing to r/o gallbladder  Your test show an uti  Take full dose of medications  Need to see pcp for folow up urine if having cont symptoms   Final Clinical Impressions(s) / UC Diagnoses   Final diagnoses:  Lower urinary tract infectious disease  Left upper quadrant abdominal pain     Discharge Instructions  Follow up with gi if  you still have pain to upper abdomen area       ED Prescriptions     Medication Sig Dispense Auth. Provider   cephALEXin (KEFLEX) 500 MG capsule Take 1 capsule (500 mg total) by mouth 4 (four) times daily. 20 capsule Maple Mirza L, NP   phenazopyridine (PYRIDIUM) 200 MG tablet Take 1 tablet (200 mg total) by mouth 3 (three) times daily. 6 tablet Coralyn Mark, NP      PDMP not reviewed this encounter.   Coralyn Mark, NP 01/08/21 1452

## 2021-01-01 NOTE — ED Triage Notes (Signed)
Pt states that she noticed a lump on the LUQ two days ago. Pt states that she feels nauseous with the pain. Pt describes the pain as achy and cramps.

## 2021-01-02 ENCOUNTER — Other Ambulatory Visit: Payer: Self-pay

## 2021-01-02 LAB — URINE CULTURE

## 2021-01-16 ENCOUNTER — Ambulatory Visit: Payer: BLUE CROSS/BLUE SHIELD

## 2021-02-13 ENCOUNTER — Other Ambulatory Visit: Payer: Self-pay

## 2021-02-13 ENCOUNTER — Telehealth (INDEPENDENT_AMBULATORY_CARE_PROVIDER_SITE_OTHER): Payer: BLUE CROSS/BLUE SHIELD | Admitting: Psychiatry

## 2021-02-13 ENCOUNTER — Encounter (HOSPITAL_COMMUNITY): Payer: Self-pay | Admitting: Psychiatry

## 2021-02-13 DIAGNOSIS — F3130 Bipolar disorder, current episode depressed, mild or moderate severity, unspecified: Secondary | ICD-10-CM | POA: Diagnosis not present

## 2021-02-13 DIAGNOSIS — F411 Generalized anxiety disorder: Secondary | ICD-10-CM

## 2021-02-13 MED ORDER — HYDROXYZINE HCL 25 MG PO TABS
ORAL_TABLET | ORAL | 3 refills | Status: DC
  Filled 2021-02-13: qty 90, 22d supply, fill #0
  Filled 2021-03-04 – 2021-03-18 (×3): qty 90, 23d supply, fill #0

## 2021-02-13 MED ORDER — LATUDA 20 MG PO TABS
20.0000 mg | ORAL_TABLET | Freq: Every evening | ORAL | 3 refills | Status: DC
  Filled 2021-02-13 – 2021-02-14 (×2): qty 30, 30d supply, fill #0
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
## 2021-02-14 ENCOUNTER — Other Ambulatory Visit: Payer: Self-pay

## 2021-02-15 ENCOUNTER — Telehealth (HOSPITAL_COMMUNITY): Payer: Self-pay | Admitting: *Deleted

## 2021-02-15 ENCOUNTER — Other Ambulatory Visit: Payer: Self-pay

## 2021-02-15 NOTE — Telephone Encounter (Signed)
Provider called patient an informed her that Alicia Parks is not covered under [employer]. Provider informed patient that latuda should be covered under medicaid manage care by the state of [place] and told her that she could call  support specialists at [phone]) & they will assist her. Provider told patient that there were other medications that could help manage her psychiatric conductions such as Abilify, Caplyta, Vraylar, and lamictal. Provider noted however that she was unsure which medication her insurance would cover. She notes that she will call her insurance company to get a list and Higher education careers adviser on Monday. No other concerns noted at this time.

## 2021-02-15 NOTE — Telephone Encounter (Signed)
Tresa Endo CPHT @ [employer] ran thru some test claims, Vraylar, Lamictal and Abilify are covered.Marland KitchenMarland KitchenMarland KitchenVraylar says it should be $0..... LVM to update & inform patient

## 2021-02-15 NOTE — Telephone Encounter (Signed)
Patient's [employer] doesn't cover lurasidone (LATUDA) 20 MG TABS tablet. Isn't on the patient's Formulary.

## 2021-02-20 ENCOUNTER — Other Ambulatory Visit: Payer: Self-pay

## 2021-02-28 ENCOUNTER — Telehealth (HOSPITAL_COMMUNITY): Payer: Self-pay | Admitting: *Deleted

## 2021-02-28 NOTE — Telephone Encounter (Signed)
BC/BS APPEAL FOR lurasidone (LATUDA) 20 MG TABS tablet HAS BEEN DENIED. ADVERSE BENEFIT DETERMINATION

## 2021-03-01 ENCOUNTER — Other Ambulatory Visit: Payer: Self-pay

## 2021-03-01 ENCOUNTER — Other Ambulatory Visit (HOSPITAL_COMMUNITY): Payer: Self-pay | Admitting: Psychiatry

## 2021-03-01 ENCOUNTER — Telehealth (HOSPITAL_COMMUNITY): Payer: Self-pay | Admitting: *Deleted

## 2021-03-01 MED ORDER — CAPLYTA 42 MG PO CAPS
42.0000 mg | ORAL_CAPSULE | Freq: Every day | ORAL | 3 refills | Status: DC
  Filled 2021-03-01 – 2021-03-04 (×2): qty 30, 30d supply, fill #0

## 2021-03-01 NOTE — Telephone Encounter (Signed)
Provider called patient and she notes that she is not able to afford Latuda as her insurance will not cover it. Provider discussed Abilify however patient notes that it was ineffective in the past. Provider also discussed Lamictal but patient notes that she will have difficulty tapering her doses up. Today she is agreeable to starting Caplyta 42 mg daily.Potential side effects of medication and risks vs benefits of treatment vs non-treatment were explained and discussed. All questions were answered. Provider encouraged patient to reach out to her insurance company to see what medications they cover. She endorsed understanding and agreed. No other concerns noted at this time.

## 2021-03-01 NOTE — Telephone Encounter (Signed)
Patient called Alicia Parks that she received message that  BC/BS APPEAL FOR lurasidone (LATUDA) 20 MG TABS tablet HAS BEEN DENIED. ADVERSE BENEFIT DETERMINATION   And that she was told/heard that there are other medications she could try.  ** Patient would knew to follow up with her insurance to see what's on her drug formulary covered med's  Patient request call back to discuss options

## 2021-03-04 ENCOUNTER — Other Ambulatory Visit: Payer: Self-pay

## 2021-03-05 ENCOUNTER — Other Ambulatory Visit: Payer: Self-pay

## 2021-03-08 ENCOUNTER — Other Ambulatory Visit (HOSPITAL_COMMUNITY): Payer: Self-pay | Admitting: Psychiatry

## 2021-03-08 ENCOUNTER — Telehealth (HOSPITAL_COMMUNITY): Payer: Self-pay | Admitting: *Deleted

## 2021-03-08 ENCOUNTER — Other Ambulatory Visit: Payer: Self-pay

## 2021-03-08 MED ORDER — LATUDA 20 MG PO TABS
20.0000 mg | ORAL_TABLET | Freq: Every day | ORAL | 3 refills | Status: DC
  Filled 2021-03-08: qty 20, 20d supply, fill #0
  Filled 2021-03-18: qty 30, 30d supply, fill #0

## 2021-03-08 NOTE — Telephone Encounter (Signed)
Call this am to inform staff she has cancelled her insurance because of the difficulty we are having getting it approved thru her insurance. Will contact provider and [employer] for order and for pharmacy to try to run it now thru patient assistance program.

## 2021-03-08 NOTE — Telephone Encounter (Signed)
Provider spoke to patient and was informed that Caplyta was not covered by her insurance. She notes that she discontinued her insurance and would like to be restarted on Latuda. Latuda 20 mg refilled and sent to preferred pharmacy.

## 2021-03-11 ENCOUNTER — Other Ambulatory Visit: Payer: Self-pay

## 2021-03-16 ENCOUNTER — Other Ambulatory Visit: Payer: Self-pay

## 2021-03-16 ENCOUNTER — Encounter (HOSPITAL_BASED_OUTPATIENT_CLINIC_OR_DEPARTMENT_OTHER): Payer: Self-pay | Admitting: Emergency Medicine

## 2021-03-16 ENCOUNTER — Emergency Department (HOSPITAL_BASED_OUTPATIENT_CLINIC_OR_DEPARTMENT_OTHER)
Admission: EM | Admit: 2021-03-16 | Discharge: 2021-03-16 | Disposition: A | Discharge status: Home / Self Care | Payer: Medicaid Other | Attending: Emergency Medicine | Admitting: Emergency Medicine

## 2021-03-16 ENCOUNTER — Emergency Department (HOSPITAL_BASED_OUTPATIENT_CLINIC_OR_DEPARTMENT_OTHER): Payer: Medicaid Other | Admitting: Radiology

## 2021-03-16 DIAGNOSIS — R079 Chest pain, unspecified: Secondary | ICD-10-CM | POA: Insufficient documentation

## 2021-03-16 DIAGNOSIS — D72829 Elevated white blood cell count, unspecified: Secondary | ICD-10-CM | POA: Insufficient documentation

## 2021-03-16 DIAGNOSIS — K209 Esophagitis, unspecified without bleeding: Secondary | ICD-10-CM | POA: Insufficient documentation

## 2021-03-16 LAB — COMPREHENSIVE METABOLIC PANEL
ALT: 18 U/L (ref 0–44)
AST: 14 U/L — ABNORMAL LOW (ref 15–41)
Albumin: 4.4 g/dL (ref 3.5–5.0)
Alkaline Phosphatase: 51 U/L (ref 38–126)
Anion gap: 8 (ref 5–15)
BUN: 9 mg/dL (ref 6–20)
CO2: 25 mmol/L (ref 22–32)
Calcium: 9.4 mg/dL (ref 8.9–10.3)
Chloride: 103 mmol/L (ref 98–111)
Creatinine, Ser: 0.7 mg/dL (ref 0.44–1.00)
GFR, Estimated: >60 mL/min (ref >60)
Glucose, Bld: 94 mg/dL (ref 70–99)
Potassium: 3.7 mmol/L (ref 3.5–5.1)
Sodium: 136 mmol/L (ref 135–145)
Total Bilirubin: 0.4 mg/dL (ref 0.3–1.2)
Total Protein: 7.5 g/dL (ref 6.5–8.1)

## 2021-03-16 LAB — CBC WITH DIFFERENTIAL/PLATELET
Abs Immature Granulocytes: 0.04 10*3/uL (ref 0.00–0.07)
Basophils Absolute: 0 10*3/uL (ref 0.0–0.1)
Basophils Relative: 0 %
Eosinophils Absolute: 0.1 10*3/uL (ref 0.0–0.5)
Eosinophils Relative: 1 %
HCT: 42 % (ref 36.0–46.0)
Hemoglobin: 14.3 g/dL (ref 12.0–15.0)
Immature Granulocytes: 0 %
Lymphocytes Relative: 31 %
Lymphs Abs: 4.5 10*3/uL — ABNORMAL HIGH (ref 0.7–4.0)
MCH: 30.1 pg (ref 26.0–34.0)
MCHC: 34 g/dL (ref 30.0–36.0)
MCV: 88.4 fL (ref 80.0–100.0)
Monocytes Absolute: 0.8 10*3/uL (ref 0.1–1.0)
Monocytes Relative: 6 %
Neutro Abs: 9.1 10*3/uL — ABNORMAL HIGH (ref 1.7–7.7)
Neutrophils Relative %: 62 %
Platelets: 382 10*3/uL (ref 150–400)
RBC: 4.75 MIL/uL (ref 3.87–5.11)
RDW: 12.8 % (ref 11.5–15.5)
WBC: 14.7 10*3/uL — ABNORMAL HIGH (ref 4.0–10.5)
nRBC: 0 % (ref 0.0–0.2)

## 2021-03-16 LAB — LIPASE, BLOOD: Lipase: 45 U/L (ref 11–51)

## 2021-03-16 LAB — TROPONIN I (HIGH SENSITIVITY): Troponin I (High Sensitivity): <2 ng/L (ref <18)

## 2021-03-16 LAB — HCG, SERUM, QUALITATIVE: Preg, Serum: NEGATIVE

## 2021-03-16 MED ORDER — SUCRALFATE 1 GM/10ML PO SUSP
1.0000 g | Freq: Once | ORAL | Status: AC
  Administered 2021-03-16: 1 g via ORAL
  Filled 2021-03-16: qty 10

## 2021-03-16 MED ORDER — ALUM & MAG HYDROXIDE-SIMETH 200-200-20 MG/5ML PO SUSP
30.0000 mL | Freq: Once | ORAL | Status: AC
  Administered 2021-03-16: 30 mL via ORAL
  Filled 2021-03-16: qty 30

## 2021-03-16 MED ORDER — LIDOCAINE VISCOUS HCL 2 % MT SOLN
15.0000 mL | Freq: Once | OROMUCOSAL | Status: AC
  Administered 2021-03-16: 15 mL via ORAL
  Filled 2021-03-16: qty 15

## 2021-03-16 MED ORDER — SUCRALFATE 1 GM/10ML PO SUSP
1.0000 g | Freq: Three times a day (TID) | ORAL | 0 refills | Status: DC
  Filled 2021-03-18: qty 420, 11d supply, fill #0

## 2021-03-16 NOTE — ED Notes (Addendum)
Pt states she can tolerate liquids fine, water @ bedside. States medicine we gave her helped.

## 2021-03-16 NOTE — ED Provider Notes (Signed)
[facility] EMERGENCY DEPT Provider Note   CSN: [id number] Arrival date & time: 03/16/21  1652     History Chief Complaint  Patient presents with   Heartburn    Alicia Parks is a 45 y.o. female.  HPI   45 y/o female with a h/o back pain, fibromylagia, gerd, migraines, restless leg syndrome, who presents to the ED today for eval of dysphasia that started about 3 weeks ago after she ate a chip. States that it feels like a ball is in her throat whenever she eats any liquids or solids. She has a hx of gerd. States she has tried otc medications and prilosec without relief. She reports associated chest pain with this that is worse with eating. She denies abd pain, vomiting or diarrhea. She does report nausea every morning.   Past Medical History:  Diagnosis Date   Back pain    Complication of anesthesia    Ear drum perforation    Fibromyalgia    GERD (gastroesophageal reflux disease)    Migraine    Restless leg syndrome     Patient Active Problem List   Diagnosis Date Noted   Dental abscess 10/04/2020   GERD (gastroesophageal reflux disease) 10/04/2020   Generalized anxiety disorder 04/25/2020   Bipolar I disorder, most recent episode depressed (HCC) 04/25/2020   Moderate episode of recurrent major depressive disorder (HCC) 04/25/2020    Past Surgical History:  Procedure Laterality Date   TONSILLECTOMY     TUBAL LIGATION       OB History     Gravida  3   Para  3   Term  3   Preterm  0   AB  0   Living  3      SAB  0   IAB  0   Ectopic  0   Multiple  0   Live Births              Family History  Problem Relation Age of Onset   Hypertension Father    Heart disease Father    Gout Father    Osteoporosis Mother    Cancer Other     Social History   Tobacco Use   Smoking status: Never   Smokeless tobacco: Never  Substance Use Topics   Alcohol use: No   Drug use: Yes    Types: Marijuana    Home Medications Prior to  Admission medications   Medication Sig Start Date End Date Taking? Authorizing Provider  sucralfate (CARAFATE) 1 GM/10ML suspension Take 10 mLs (1 g total) by mouth 4 (four) times daily -  with meals and at bedtime. 03/16/21  Yes Yekaterina Escutia S, PA-C  cephALEXin (KEFLEX) 500 MG capsule Take 1 capsule (500 mg total) by mouth 4 (four) times daily. 01/01/21   Coralyn Mark, NP  chlorhexidine (PERIDEX) 0.12 % solution Place 15 ml (fill to line inside cap) in the mouth 2 times per day after brushing teeth, swish in mouth for 30 seconds then spit out 10/24/20     hydrOXYzine (ATARAX/VISTARIL) 25 MG tablet TAKE 1 TABLET (25 MG TOTAL) BY MOUTH 4 (FOUR) TIMES DAILY AS NEEDED. 02/13/21   Toy Cookey E, NP  ibuprofen (ADVIL) 800 MG tablet Take 1 tablet (800 mg total) by mouth 2 (two) times daily as needed. 10/03/20   Bing Neighbors, FNP  lurasidone (LATUDA) 20 MG TABS tablet Take 1 tablet (20 mg total) by mouth at bedtime. 03/08/21   Toy Cookey  E, NP  norethindrone (CAMILA) 0.35 MG tablet take 1 tablet by mouth daily 12/28/20     pantoprazole (PROTONIX) 40 MG tablet Take 1 tablet (40 mg total) by mouth daily. 10/04/20   Storm Frisk, MD  phenazopyridine (PYRIDIUM) 200 MG tablet Take 1 tablet (200 mg total) by mouth 3 (three) times daily. 01/01/21   Coralyn Mark, NP    Allergies    Aspirin, Compazine [prochlorperazine edisylate], and Effexor [venlafaxine]  Review of Systems   Review of Systems  Constitutional:  Negative for chills and fever.  HENT:  Positive for trouble swallowing. Negative for ear pain and sore throat.   Eyes:  Negative for visual disturbance.  Respiratory:  Negative for cough and shortness of breath.   Cardiovascular:  Positive for chest pain.  Gastrointestinal:  Negative for abdominal pain, constipation, diarrhea, nausea and vomiting.  Genitourinary:  Negative for dysuria and hematuria.  Musculoskeletal:  Negative for back pain.  Skin:  Negative for rash.   Neurological:  Negative for headaches.  All other systems reviewed and are negative.  Physical Exam Updated Vital Signs BP (!) 133/98   Pulse 82   Temp 98.5 F (36.9 C) (Oral)   Resp 18   Ht 5\' 4"  (1.626 m)   Wt 93.4 kg   SpO2 98%   BMI 35.36 kg/m   Physical Exam Vitals and nursing note reviewed.  Constitutional:      General: She is not in acute distress.    Appearance: She is well-developed.  HENT:     Head: Normocephalic and atraumatic.  Eyes:     Conjunctiva/sclera: Conjunctivae normal.  Cardiovascular:     Rate and Rhythm: Normal rate and regular rhythm.     Heart sounds: Normal heart sounds. No murmur heard. Pulmonary:     Effort: Pulmonary effort is normal. No respiratory distress.     Breath sounds: Normal breath sounds. No wheezing, rhonchi or rales.  Abdominal:     General: Bowel sounds are normal.     Palpations: Abdomen is soft.     Tenderness: There is no abdominal tenderness. There is no guarding or rebound.  Musculoskeletal:     Cervical back: Neck supple.  Skin:    General: Skin is warm and dry.  Neurological:     Mental Status: She is alert.    ED Results / Procedures / Treatments   Labs (all labs ordered are listed, but only abnormal results are displayed) Labs Reviewed  CBC WITH DIFFERENTIAL/PLATELET - Abnormal; Notable for the following components:      Result Value   WBC 14.7 (*)    Neutro Abs 9.1 (*)    Lymphs Abs 4.5 (*)    All other components within normal limits  COMPREHENSIVE METABOLIC PANEL - Abnormal; Notable for the following components:   AST 14 (*)    All other components within normal limits  LIPASE, BLOOD  HCG, SERUM, QUALITATIVE  TROPONIN I (HIGH SENSITIVITY)    EKG None  Radiology DG Chest 2 View  Result Date: 03/16/2021 CLINICAL DATA:  Chest pain EXAM: CHEST - 2 VIEW COMPARISON:  09/02/2020 FINDINGS: The heart size and mediastinal contours are within normal limits. Both lungs are clear. The visualized skeletal  structures are unremarkable. IMPRESSION: No active cardiopulmonary disease. Electronically Signed   By: 09/04/2020 D.O.   On: 03/16/2021 18:06    Procedures Procedures   Medications Ordered in ED Medications  alum & mag hydroxide-simeth (MAALOX/MYLANTA) 200-200-20 MG/5ML suspension 30 mL (30  mLs Oral Given 03/16/21 1724)    And  lidocaine (XYLOCAINE) 2 % viscous mouth solution 15 mL (15 mLs Oral Given 03/16/21 1725)  sucralfate (CARAFATE) 1 GM/10ML suspension 1 g (1 g Oral Given 03/16/21 1725)    ED Course  I have reviewed the triage vital signs and the nursing notes.  Pertinent labs & imaging results that were available during my care of the patient were reviewed by me and considered in my medical decision making (see chart for details).    MDM Rules/Calculators/A&P                          45 y/o f presenting for eval of dysphagia for several weeks. Has been able to tolerate po but has discomfort with this  Reviewed/interpreted labs CBC with leukocytosis that appears chronic CMP grossly unremarkable Lipase negative Trop neg Urine preg neg  EKG with NSR, no acute ischemic changes  Reviewed/interpreted imaging CXR - neg  Pt sxs consistent with reflux and likely esophagitis. Will give rx for carafate which she can take in addition to her ppi. Will give referral to GI and advised on plan for f/u. Advised on return precautions. She voices understanding of the plan and reasons to return. All questions answered, pt stable for discharge.    Final Clinical Impression(s) / ED Diagnoses Final diagnoses:  Esophagitis    Rx / DC Orders ED Discharge Orders          Ordered    sucralfate (CARAFATE) 1 GM/10ML suspension  3 times daily with meals & bedtime        03/16/21 1837             Karrie Meres, PA-C 03/16/21 1837    Benjiman Core, MD 03/16/21 2340

## 2021-03-16 NOTE — ED Triage Notes (Signed)
Pt reports choking onfood about a week ago  and still feels like something is still stuck in her lower throat. Pt also reports severe heartburn.

## 2021-03-16 NOTE — Discharge Instructions (Signed)
Take carafate as directed   Continue taking prilosec  Please follow up with the gastroenterologist or your primary care provider within the next 5-7 days. Please return to the ER sooner if you have any new or worsening symptoms, or if you have any of the following symptoms:  Abdominal pain that does not go away.  You have a fever.  You keep throwing up (vomiting).  The pain is felt only in portions of the abdomen. Pain in the right side could possibly be appendicitis. In an adult, pain in the left lower portion of the abdomen could be colitis or diverticulitis.  You pass bloody or black tarry stools.  There is bright red blood in the stool.  The constipation stays for more than 4 days.  There is belly (abdominal) or rectal pain.  You do not seem to be getting better.  You have any questions or concerns.

## 2021-03-18 ENCOUNTER — Other Ambulatory Visit: Payer: Self-pay

## 2021-03-19 ENCOUNTER — Telehealth: Payer: Self-pay | Admitting: Critical Care Medicine

## 2021-03-19 NOTE — Telephone Encounter (Signed)
Copied from CRM [id number]. Topic: General - Other >> Mar 18, 2021  9:14 AM Louie Bun, Rosey Bath D wrote: Reason for CRM: Patient called and would like a call back to schedule appt for financial help with the orange card.  Left patient vm to call [phone] to schedule appt.

## 2021-03-20 ENCOUNTER — Other Ambulatory Visit: Payer: Self-pay

## 2021-03-21 ENCOUNTER — Other Ambulatory Visit: Payer: Self-pay

## 2021-03-22 ENCOUNTER — Other Ambulatory Visit: Payer: Self-pay

## 2021-03-23 ENCOUNTER — Other Ambulatory Visit: Payer: Self-pay

## 2021-03-23 ENCOUNTER — Emergency Department (HOSPITAL_COMMUNITY)
Admission: EM | Admit: 2021-03-23 | Discharge: 2021-03-23 | Disposition: A | Discharge status: Home / Self Care | Payer: Medicaid Other | Attending: Emergency Medicine | Admitting: Emergency Medicine

## 2021-03-23 ENCOUNTER — Encounter (HOSPITAL_COMMUNITY): Payer: Self-pay

## 2021-03-23 DIAGNOSIS — Z5321 Procedure and treatment not carried out due to patient leaving prior to being seen by health care provider: Secondary | ICD-10-CM | POA: Insufficient documentation

## 2021-03-23 DIAGNOSIS — R1310 Dysphagia, unspecified: Secondary | ICD-10-CM | POA: Insufficient documentation

## 2021-03-23 LAB — COMPREHENSIVE METABOLIC PANEL
ALT: 25 U/L (ref 0–44)
AST: 19 U/L (ref 15–41)
Albumin: 4.4 g/dL (ref 3.5–5.0)
Alkaline Phosphatase: 57 U/L (ref 38–126)
Anion gap: 8 (ref 5–15)
BUN: 13 mg/dL (ref 6–20)
CO2: 27 mmol/L (ref 22–32)
Calcium: 9.1 mg/dL (ref 8.9–10.3)
Chloride: 105 mmol/L (ref 98–111)
Creatinine, Ser: 0.92 mg/dL (ref 0.44–1.00)
GFR, Estimated: >60 mL/min (ref >60)
Glucose, Bld: 102 mg/dL — ABNORMAL HIGH (ref 70–99)
Potassium: 3.8 mmol/L (ref 3.5–5.1)
Sodium: 140 mmol/L (ref 135–145)
Total Bilirubin: 0.7 mg/dL (ref 0.3–1.2)
Total Protein: 7.8 g/dL (ref 6.5–8.1)

## 2021-03-23 LAB — CBC WITH DIFFERENTIAL/PLATELET
Abs Immature Granulocytes: 0.05 10*3/uL (ref 0.00–0.07)
Basophils Absolute: 0 10*3/uL (ref 0.0–0.1)
Basophils Relative: 0 %
Eosinophils Absolute: 0.2 10*3/uL (ref 0.0–0.5)
Eosinophils Relative: 1 %
HCT: 41.2 % (ref 36.0–46.0)
Hemoglobin: 14 g/dL (ref 12.0–15.0)
Immature Granulocytes: 0 %
Lymphocytes Relative: 32 %
Lymphs Abs: 4 10*3/uL (ref 0.7–4.0)
MCH: 30.4 pg (ref 26.0–34.0)
MCHC: 34 g/dL (ref 30.0–36.0)
MCV: 89.4 fL (ref 80.0–100.0)
Monocytes Absolute: 0.7 10*3/uL (ref 0.1–1.0)
Monocytes Relative: 6 %
Neutro Abs: 7.7 10*3/uL (ref 1.7–7.7)
Neutrophils Relative %: 61 %
Platelets: 384 10*3/uL (ref 150–400)
RBC: 4.61 MIL/uL (ref 3.87–5.11)
RDW: 13 % (ref 11.5–15.5)
WBC: 12.6 10*3/uL — ABNORMAL HIGH (ref 4.0–10.5)
nRBC: 0 % (ref 0.0–0.2)

## 2021-03-23 NOTE — ED Triage Notes (Signed)
Pt arrived via POV, c/o choking on food x1 week. Was seen previously for same. NAD at this time. Spo2 100%, pt speaking in full sentences, able to tolerate POs

## 2021-03-23 NOTE — ED Provider Notes (Signed)
Emergency Medicine Provider Triage Evaluation Note  Alicia Parks , a 45 y.o. female  was evaluated in triage.  Pt complains of dysphagia.  Has been going on for 3 weeks.  It is intermittent, although overall patient feels it is getting worse.  Seen a week ago for the same, given Carafate which patient just picked up yesterday.  Encouraged to follow-up with GI, patient is unable to do so for several weeks due to insurance issues.  She reports feeling something is stuck in her throat making her feel like her airway is closing.  Occasionally having issues with saliva, but mostly with solid foods.  Review of Systems  Positive: dysphagia Negative: fever  Physical Exam  BP 123/87 (BP Location: Left Arm)   Pulse 70   Temp 98.2 F (36.8 C) (Oral)   Resp 18   SpO2 100%  Gen:   Awake, no distress   Resp:  Normal effort. ctab MSK:   Moves extremities without difficulty  Other:  No TTP of abdomen.  OP grossly clear without erythema. Airway intact  Medical Decision Making  Medically screening exam initiated at 2:13 PM.  Appropriate orders placed.  Alicia Parks was informed that the remainder of the evaluation will be completed by another provider, this initial triage assessment does not replace that evaluation, and the importance of remaining in the ED until their evaluation is complete.  Labs, ekg   [place], [place], PA-C 03/23/21 1414    Ernie Avena, MD 03/23/21 1844

## 2021-03-24 ENCOUNTER — Other Ambulatory Visit: Payer: Self-pay

## 2021-03-24 ENCOUNTER — Emergency Department (HOSPITAL_COMMUNITY)
Admission: EM | Admit: 2021-03-24 | Discharge: 2021-03-24 | Disposition: A | Discharge status: Home / Self Care | Payer: Medicaid Other | Attending: Emergency Medicine | Admitting: Emergency Medicine

## 2021-03-24 ENCOUNTER — Encounter (HOSPITAL_COMMUNITY): Payer: Self-pay | Admitting: *Deleted

## 2021-03-24 ENCOUNTER — Emergency Department (HOSPITAL_COMMUNITY): Payer: Medicaid Other

## 2021-03-24 NOTE — ED Notes (Signed)
PT left AMA

## 2021-03-24 NOTE — ED Triage Notes (Signed)
The pt has had difficulty swallowing for 3 weeks  she feels like her food is not moving down her esophagus like it should  her swallowing is getting worse  lmp sept 7th

## 2021-03-24 NOTE — ED Provider Notes (Signed)
Emergency Medicine Provider Triage Evaluation Note  Alicia Parks , a 45 y.o. female  was evaluated in triage.  Pt complains of ongoing dysphagia described as difficulty swallowing with associated pain. Continues to tolerate PO and secretions. Has tried Carafate w/o relief. Has not yet followed up with GI due to insurance issues.  Review of Systems  Positive: Dysphagia, odynophagia Negative: Fevers, drooling, voice muffling  Physical Exam  There were no vitals taken for this visit. Gen:   Awake, no distress   Resp:  Normal effort  MSK:   Moves extremities without difficulty  Other:  Tolerating secretions w/o difficulty  Medical Decision Making  Medically screening exam initiated at 1:01 AM.  Appropriate orders placed.  Alicia Parks was informed that the remainder of the evaluation will be completed by another provider, this initial triage assessment does not replace that evaluation, and the importance of remaining in the ED until their evaluation is complete.  Dysphagia    Antony Madura, PA-C 03/24/21 0102    Shon Baton, MD 03/25/21 [phone]

## 2021-03-25 ENCOUNTER — Other Ambulatory Visit: Payer: Self-pay

## 2021-03-25 ENCOUNTER — Encounter (HOSPITAL_COMMUNITY): Payer: Self-pay | Admitting: Emergency Medicine

## 2021-03-25 ENCOUNTER — Encounter (HOSPITAL_BASED_OUTPATIENT_CLINIC_OR_DEPARTMENT_OTHER): Payer: Self-pay | Admitting: Emergency Medicine

## 2021-03-25 ENCOUNTER — Emergency Department (HOSPITAL_COMMUNITY)
Admission: EM | Admit: 2021-03-25 | Discharge: 2021-03-25 | Disposition: A | Discharge status: Home / Self Care | Payer: Medicaid Other | Attending: Emergency Medicine | Admitting: Emergency Medicine

## 2021-03-25 ENCOUNTER — Emergency Department (HOSPITAL_COMMUNITY): Payer: Medicaid Other

## 2021-03-25 ENCOUNTER — Emergency Department (HOSPITAL_BASED_OUTPATIENT_CLINIC_OR_DEPARTMENT_OTHER)
Admission: EM | Admit: 2021-03-25 | Discharge: 2021-03-25 | Disposition: A | Discharge status: Home / Self Care | Payer: BLUE CROSS/BLUE SHIELD | Attending: Emergency Medicine | Admitting: Emergency Medicine

## 2021-03-25 DIAGNOSIS — Z20822 Contact with and (suspected) exposure to covid-19: Secondary | ICD-10-CM | POA: Diagnosis not present

## 2021-03-25 DIAGNOSIS — K2100 Gastro-esophageal reflux disease with esophagitis, without bleeding: Secondary | ICD-10-CM

## 2021-03-25 DIAGNOSIS — R079 Chest pain, unspecified: Secondary | ICD-10-CM | POA: Insufficient documentation

## 2021-03-25 DIAGNOSIS — R1310 Dysphagia, unspecified: Secondary | ICD-10-CM | POA: Insufficient documentation

## 2021-03-25 DIAGNOSIS — F410 Panic disorder [episodic paroxysmal anxiety] without agoraphobia: Secondary | ICD-10-CM | POA: Insufficient documentation

## 2021-03-25 LAB — CBC WITH DIFFERENTIAL/PLATELET
Abs Immature Granulocytes: 0.06 10*3/uL (ref 0.00–0.07)
Basophils Absolute: 0 10*3/uL (ref 0.0–0.1)
Basophils Relative: 0 %
Eosinophils Absolute: 0.1 10*3/uL (ref 0.0–0.5)
Eosinophils Relative: 1 %
HCT: 41.4 % (ref 36.0–46.0)
Hemoglobin: 14.1 g/dL (ref 12.0–15.0)
Immature Granulocytes: 0 %
Lymphocytes Relative: 29 %
Lymphs Abs: 4.3 10*3/uL — ABNORMAL HIGH (ref 0.7–4.0)
MCH: 30.3 pg (ref 26.0–34.0)
MCHC: 34.1 g/dL (ref 30.0–36.0)
MCV: 88.8 fL (ref 80.0–100.0)
Monocytes Absolute: 0.8 10*3/uL (ref 0.1–1.0)
Monocytes Relative: 6 %
Neutro Abs: 9.4 10*3/uL — ABNORMAL HIGH (ref 1.7–7.7)
Neutrophils Relative %: 64 %
Platelets: 422 10*3/uL — ABNORMAL HIGH (ref 150–400)
RBC: 4.66 MIL/uL (ref 3.87–5.11)
RDW: 13 % (ref 11.5–15.5)
WBC: 14.8 10*3/uL — ABNORMAL HIGH (ref 4.0–10.5)
nRBC: 0 % (ref 0.0–0.2)

## 2021-03-25 LAB — COMPREHENSIVE METABOLIC PANEL
ALT: 24 U/L (ref 0–44)
AST: 18 U/L (ref 15–41)
Albumin: 4 g/dL (ref 3.5–5.0)
Alkaline Phosphatase: 51 U/L (ref 38–126)
Anion gap: 9 (ref 5–15)
BUN: 6 mg/dL (ref 6–20)
CO2: 23 mmol/L (ref 22–32)
Calcium: 9.4 mg/dL (ref 8.9–10.3)
Chloride: 104 mmol/L (ref 98–111)
Creatinine, Ser: 0.74 mg/dL (ref 0.44–1.00)
GFR, Estimated: >60 mL/min (ref >60)
Glucose, Bld: 121 mg/dL — ABNORMAL HIGH (ref 70–99)
Potassium: 3.3 mmol/L — ABNORMAL LOW (ref 3.5–5.1)
Sodium: 136 mmol/L (ref 135–145)
Total Bilirubin: 0.9 mg/dL (ref 0.3–1.2)
Total Protein: 7.5 g/dL (ref 6.5–8.1)

## 2021-03-25 LAB — TROPONIN I (HIGH SENSITIVITY)
Troponin I (High Sensitivity): 4 ng/L (ref <18)
Troponin I (High Sensitivity): 4 ng/L (ref <18)

## 2021-03-25 LAB — I-STAT BETA HCG BLOOD, ED (MC, WL, AP ONLY): I-stat hCG, quantitative: <5 m[IU]/mL (ref <5)

## 2021-03-25 LAB — RESP PANEL BY RT-PCR (FLU A&B, COVID) ARPGX2
Influenza A by PCR: NEGATIVE
Influenza B by PCR: NEGATIVE
SARS Coronavirus 2 by RT PCR: NEGATIVE

## 2021-03-25 MED ORDER — LIDOCAINE VISCOUS HCL 2 % MT SOLN
15.0000 mL | Freq: Four times a day (QID) | OROMUCOSAL | 0 refills | Status: DC | PRN
  Filled 2021-03-25: qty 100, 2d supply, fill #0

## 2021-03-25 MED ORDER — HYOSCYAMINE SULFATE 0.125 MG PO TABS: 0.1250 mg | ORAL_TABLET | Freq: Once | ORAL | Status: DC

## 2021-03-25 MED ORDER — LIDOCAINE VISCOUS HCL 2 % MT SOLN
15.0000 mL | Freq: Once | OROMUCOSAL | Status: AC
  Administered 2021-03-25: 15 mL via OROMUCOSAL
  Filled 2021-03-25: qty 15

## 2021-03-25 MED ORDER — IOHEXOL 300 MG/ML  SOLN
75.0000 mL | Freq: Once | INTRAMUSCULAR | Status: AC | PRN
  Administered 2021-03-25: 75 mL via INTRAVENOUS

## 2021-03-25 MED ORDER — OMEPRAZOLE 20 MG PO CPDR: 20.0000 mg | DELAYED_RELEASE_CAPSULE | Freq: Every day | ORAL | 0 refills | Status: DC

## 2021-03-25 MED ORDER — ALUM & MAG HYDROXIDE-SIMETH 200-200-20 MG/5ML PO SUSP
30.0000 mL | Freq: Once | ORAL | Status: AC
  Administered 2021-03-25: 30 mL via ORAL
  Filled 2021-03-25: qty 30

## 2021-03-25 MED ORDER — SUCRALFATE 1 GM/10ML PO SUSP: 1.0000 g | Freq: Three times a day (TID) | ORAL | 0 refills | Status: DC

## 2021-03-25 MED ORDER — HYOSCYAMINE SULFATE 0.125 MG SL SUBL
0.1250 mg | SUBLINGUAL_TABLET | Freq: Once | SUBLINGUAL | Status: AC
  Administered 2021-03-25: 0.125 mg via SUBLINGUAL
  Filled 2021-03-25: qty 1

## 2021-03-25 MED ORDER — HYDROXYZINE HCL 10 MG/5ML PO SYRP
25.0000 mg | ORAL_SOLUTION | Freq: Three times a day (TID) | ORAL | 0 refills | Status: DC | PRN
  Filled 2021-03-25: qty 240, 7d supply, fill #0

## 2021-03-25 NOTE — Discharge Instructions (Addendum)
Follow-up with GI as discussed. If you are unable to swallow your hydroxyzine tablets, there is a prescription for hydroxyzine liquid.  Take 1 or the other, not both.  I recommend taking this before eating. There is a also a prescription for viscous lidocaine.  This is the medication that we will Your throat.  If this was helpful in the emergency room, pick up your prescription and use as prescribed.

## 2021-03-25 NOTE — Discharge Instructions (Addendum)
Follow up with gastroenterology as previous directed

## 2021-03-25 NOTE — ED Triage Notes (Addendum)
Pt arrives to ED to be evaluated for odonyphgia. Pt states that she was seen at [facility] and [facility] over the past two days for same. Pt states that her symptoms have not changed and are constant. These have started to worsen over the past 3 days. Pt reports that she still is having a hard time swallowing. She reports this is only with food and not water. She states when the food finally goes down she experiences burning. She had a negative CT chest and CT soft tissue neck today at [facility].

## 2021-03-25 NOTE — ED Notes (Signed)
E-signature pad unavailable at time of pt discharge. This RN discussed discharge materials with pt and answered all pt questions. Pt stated understanding of discharge material. ? ?

## 2021-03-25 NOTE — ED Provider Notes (Signed)
[facility] EMERGENCY DEPARTMENT Provider Note   CSN: [id number] Arrival date & time: 03/25/21  0024     History No chief complaint on file.   Alicia Parks is a 45 y.o. female.  The history is provided by the patient.  Illness Location:  Neck and chest Quality:  Pain with swallowing and "feeling a lump in my throat" Severity:  Moderate Onset quality:  Gradual Duration: weeks. Timing:  Constant Progression:  Worsening Chronicity:  New Context:  Has known GERD and was supposed to see GI but is not going due to financial issues Relieved by:  Nothing Worsened by:  Nothing Ineffective treatments:  Protonix, was better with carafate but stopped this due to loose stools Associated symptoms: no abdominal pain, no congestion, no cough, no diarrhea, no ear pain, no fatigue, no fever, no headaches, no loss of consciousness, no myalgias, no nausea, no rash, no rhinorrhea, no shortness of breath, no sore throat, no vomiting and no wheezing       Past Medical History:  Diagnosis Date   Back pain    Complication of anesthesia    Ear drum perforation    Fibromyalgia    GERD (gastroesophageal reflux disease)    Migraine    Restless leg syndrome     Patient Active Problem List   Diagnosis Date Noted   Dental abscess 10/04/2020   GERD (gastroesophageal reflux disease) 10/04/2020   Generalized anxiety disorder 04/25/2020   Bipolar I disorder, most recent episode depressed (HCC) 04/25/2020   Moderate episode of recurrent major depressive disorder (HCC) 04/25/2020    Past Surgical History:  Procedure Laterality Date   TONSILLECTOMY     TUBAL LIGATION       OB History     Gravida  3   Para  3   Term  3   Preterm  0   AB  0   Living  3      SAB  0   IAB  0   Ectopic  0   Multiple  0   Live Births              Family History  Problem Relation Age of Onset   Hypertension Father    Heart disease Father    Gout Father     Osteoporosis Mother    Cancer Other     Social History   Tobacco Use   Smoking status: Never   Smokeless tobacco: Never  Substance Use Topics   Alcohol use: No   Drug use: Yes    Types: Marijuana    Home Medications Prior to Admission medications   Medication Sig Start Date End Date Taking? Authorizing Provider  diphenhydrAMINE (SOMINEX) 25 MG tablet Take 25 mg by mouth daily as needed for allergies.   Yes [provider]  hydrOXYzine (ATARAX/VISTARIL) 25 MG tablet TAKE 1 TABLET (25 MG TOTAL) BY MOUTH 4 (FOUR) TIMES DAILY AS NEEDED. Patient taking differently: Take 25 mg by mouth every 6 (six) hours as needed for itching or anxiety. 02/13/21  Yes Toy Cookey E, NP  ibuprofen (ADVIL) 200 MG tablet Take 600 mg by mouth every 6 (six) hours as needed for headache or moderate pain.   Yes [provider]  pantoprazole (PROTONIX) 40 MG tablet Take 1 tablet (40 mg total) by mouth daily. 10/04/20  Yes Storm Frisk, MD  QUEtiapine (SEROQUEL) 50 MG tablet Take 50 mg by mouth at bedtime.   Yes [provider]  sucralfate (CARAFATE) 1 GM/10ML suspension Take 10 mLs (1 g total) by mouth 4 (four) times daily -  with meals and at bedtime. 03/25/21  Yes Blen Ransome, MD  sucralfate (CARAFATE) 1 GM/10ML suspension Take 1 g by mouth 4 (four) times daily as needed (stomach ulcers).   Yes [provider]  vitamin B-12 (CYANOCOBALAMIN) 1000 MCG tablet Take 1,000 mcg by mouth daily.   Yes [provider]  cephALEXin (KEFLEX) 500 MG capsule Take 1 capsule (500 mg total) by mouth 4 (four) times daily. Patient not taking: Reported on 03/25/2021 01/01/21   Coralyn Mark, NP  chlorhexidine (PERIDEX) 0.12 % solution Place 15 ml (fill to line inside cap) in the mouth 2 times per day after brushing teeth, swish in mouth for 30 seconds then spit out Patient not taking: Reported on 03/25/2021 10/24/20     ibuprofen (ADVIL) 800 MG tablet Take 1 tablet (800 mg  total) by mouth 2 (two) times daily as needed. Patient not taking: Reported on 03/25/2021 10/03/20   Bing Neighbors, FNP  lurasidone (LATUDA) 20 MG TABS tablet Take 1 tablet (20 mg total) by mouth at bedtime. Patient not taking: Reported on 03/25/2021 03/08/21   Shanna Cisco, NP  norethindrone (CAMILA) 0.35 MG tablet take 1 tablet by mouth daily Patient not taking: Reported on 03/25/2021 12/28/20     phenazopyridine (PYRIDIUM) 200 MG tablet Take 1 tablet (200 mg total) by mouth 3 (three) times daily. Patient not taking: Reported on 03/25/2021 01/01/21   Coralyn Mark, NP    Allergies    Aspirin, Compazine [prochlorperazine edisylate], Effexor [venlafaxine], and Tylenol [acetaminophen]  Review of Systems   Review of Systems  Constitutional:  Negative for fatigue and fever.  HENT:  Negative for congestion, ear pain, rhinorrhea and sore throat.   Eyes:  Negative for redness.  Respiratory:  Negative for cough, shortness of breath and wheezing.   Gastrointestinal:  Negative for abdominal pain, diarrhea, nausea and vomiting.  Genitourinary:  Negative for difficulty urinating.  Musculoskeletal:  Negative for myalgias and neck stiffness.  Skin:  Negative for rash.  Neurological:  Negative for loss of consciousness and headaches.  Psychiatric/Behavioral:  Negative for agitation.   All other systems reviewed and are negative.  Physical Exam Updated Vital Signs BP 127/89   Pulse 66   Temp 98.3 F (36.8 C) (Oral)   Resp 16   LMP 02/27/2021   SpO2 98%   Physical Exam Vitals and nursing note reviewed.  Constitutional:      General: She is not in acute distress.    Appearance: Normal appearance. She is not diaphoretic.  HENT:     Head: Normocephalic and atraumatic.     Nose: Nose normal.  Eyes:     Conjunctiva/sclera: Conjunctivae normal.     Pupils: Pupils are equal, round, and reactive to light.  Cardiovascular:     Rate and Rhythm: Normal rate and regular rhythm.      Pulses: Normal pulses.     Heart sounds: Normal heart sounds.  Pulmonary:     Effort: Pulmonary effort is normal.     Breath sounds: Normal breath sounds.  Abdominal:     General: Abdomen is flat. Bowel sounds are normal.     Palpations: Abdomen is soft.     Tenderness: There is no abdominal tenderness. There is no guarding or rebound.  Musculoskeletal:        General: Normal range of motion.     Cervical back:  Normal range of motion and neck supple.  Skin:    General: Skin is warm and dry.     Capillary Refill: Capillary refill takes less than 2 seconds.  Neurological:     General: No focal deficit present.     Mental Status: She is alert and oriented to person, place, and time.     Deep Tendon Reflexes: Reflexes normal.  Psychiatric:        Mood and Affect: Mood normal.        Behavior: Behavior normal.    ED Results / Procedures / Treatments   Labs (all labs ordered are listed, but only abnormal results are displayed) Results for orders placed or performed during the hospital encounter of 03/25/21  CBC with Differential  Result Value Ref Range   WBC 14.8 (H) 4.0 - 10.5 K/uL   RBC 4.66 3.87 - 5.11 MIL/uL   Hemoglobin 14.1 12.0 - 15.0 g/dL   HCT 16.1 09.6 - 04.5 %   MCV 88.8 80.0 - 100.0 fL   MCH 30.3 26.0 - 34.0 pg   MCHC 34.1 30.0 - 36.0 g/dL   RDW 40.9 81.1 - 91.4 %   Platelets 422 (H) 150 - 400 K/uL   nRBC 0.0 0.0 - 0.2 %   Neutrophils Relative % 64 %   Neutro Abs 9.4 (H) 1.7 - 7.7 K/uL   Lymphocytes Relative 29 %   Lymphs Abs 4.3 (H) 0.7 - 4.0 K/uL   Monocytes Relative 6 %   Monocytes Absolute 0.8 0.1 - 1.0 K/uL   Eosinophils Relative 1 %   Eosinophils Absolute 0.1 0.0 - 0.5 K/uL   Basophils Relative 0 %   Basophils Absolute 0.0 0.0 - 0.1 K/uL   Immature Granulocytes 0 %   Abs Immature Granulocytes 0.06 0.00 - 0.07 K/uL  Comprehensive metabolic panel  Result Value Ref Range   Sodium 136 135 - 145 mmol/L   Potassium 3.3 (L) 3.5 - 5.1 mmol/L   Chloride 104  98 - 111 mmol/L   CO2 23 22 - 32 mmol/L   Glucose, Bld 121 (H) 70 - 99 mg/dL   BUN 6 6 - 20 mg/dL   Creatinine, Ser 7.82 0.44 - 1.00 mg/dL   Calcium 9.4 8.9 - 95.6 mg/dL   Total Protein 7.5 6.5 - 8.1 g/dL   Albumin 4.0 3.5 - 5.0 g/dL   AST 18 15 - 41 U/L   ALT 24 0 - 44 U/L   Alkaline Phosphatase 51 38 - 126 U/L   Total Bilirubin 0.9 0.3 - 1.2 mg/dL   GFR, Estimated >21 >30 mL/min   Anion gap 9 5 - 15  I-Stat Beta hCG blood, ED ([facility], [facility], [facility] only)  Result Value Ref Range   I-stat hCG, quantitative <5.0 <5 mIU/mL   Comment 3          Troponin I (High Sensitivity)  Result Value Ref Range   Troponin I (High Sensitivity) 4 <18 ng/L  Troponin I (High Sensitivity)  Result Value Ref Range   Troponin I (High Sensitivity) 4 <18 ng/L   DG Neck Soft Tissue  Result Date: 03/24/2021 CLINICAL DATA:  Difficulty swallowing for 3 weeks EXAM: NECK SOFT TISSUES - 1+ VIEW COMPARISON:  None. FINDINGS: Epiglottis and aryepiglottic folds are within normal limits. No prevertebral soft tissue changes are seen. No focal bony abnormality is noted. IMPRESSION: No acute abnormality noted. Electronically Signed   By: Alcide Clever M.D.   On: 03/24/2021 01:38   DG  Chest 2 View  Result Date: 03/16/2021 CLINICAL DATA:  Chest pain EXAM: CHEST - 2 VIEW COMPARISON:  09/02/2020 FINDINGS: The heart size and mediastinal contours are within normal limits. Both lungs are clear. The visualized skeletal structures are unremarkable. IMPRESSION: No active cardiopulmonary disease. Electronically Signed   By: Duanne Guess D.O.   On: 03/16/2021 18:06   CT Soft Tissue Neck W Contrast  Result Date: 03/25/2021 CLINICAL DATA:  Initial evaluation for dysphagia. EXAM: CT NECK WITH CONTRAST TECHNIQUE: Multidetector CT imaging of the neck was performed using the standard protocol following the bolus administration of intravenous contrast. CONTRAST:  75mL OMNIPAQUE IOHEXOL 300 MG/ML  SOLN COMPARISON:  Radiograph from 03/24/2021.  FINDINGS: Pharynx and larynx: Oral cavity within normal limits. No acute abnormality about the dentition. Palatine tonsils symmetric and within normal limits. Nasopharynx and oropharynx within normal limits. Parapharyngeal fat maintained. No retropharyngeal collection or swelling. Epiglottis normal. Vallecula largely clear. Remainder of the hypopharynx and supraglottic larynx within normal limits. Piriform sinuses are clear. Glottis within normal limits. Subglottic airway patent and clear. Visualized upper esophagus within normal limits. Salivary glands: Salivary glands including the parotid and submandibular glands are within normal limits. Thyroid: Normal. Lymph nodes: No enlarged or pathologic adenopathy within the neck. Vascular: Normal intravascular enhancement seen throughout the neck. Limited intracranial: Unremarkable. Visualized orbits: Unremarkable. Mastoids and visualized paranasal sinuses: Visualized paranasal sinuses are clear. Visualized mastoid air cells and middle ear cavities are well pneumatized and free of fluid. Skeleton: No discrete or worrisome osseous lesions. Mild cervical spondylosis noted at C5-6 and C6-7. No bulky anterior osteophytic spurring. Upper chest: Visualized upper chest demonstrates no significant finding, although better evaluated on concomitant CT of the chest. Other: Unremarkable. IMPRESSION: Negative CT of the neck. No findings to explain patient's symptoms identified. Electronically Signed   By: Rise Mu M.D.   On: 03/25/2021 02:05   CT Chest W Contrast  Result Date: 03/25/2021 CLINICAL DATA:  Chest pain with difficulty swallowing food. EXAM: CT CHEST WITH CONTRAST TECHNIQUE: Multidetector CT imaging of the chest was performed during intravenous contrast administration. CONTRAST:  75mL OMNIPAQUE IOHEXOL 300 MG/ML  SOLN COMPARISON:  None. FINDINGS: Cardiovascular: No significant vascular findings. Normal heart size. No pericardial effusion. Mediastinum/Nodes:  No enlarged mediastinal, hilar, or axillary lymph nodes. Thyroid gland, trachea, and esophagus demonstrate no significant findings. Lungs/Pleura: Lungs are clear. No pleural effusion or pneumothorax. Upper Abdomen: There is diffuse fatty infiltration of the liver parenchyma. Musculoskeletal: No chest wall abnormality. No acute or significant osseous findings. IMPRESSION: 1. No CT evidence of acute intrathoracic pathology. 2. Hepatic steatosis. Electronically Signed   By: Aram Candela M.D.   On: 03/25/2021 01:36    EKG EKG Interpretation  Date/Time:  Monday March 25 2021 00:31:51 EDT Ventricular Rate:  82 PR Interval:  108 QRS Duration: 92 QT Interval:  392 QTC Calculation: 457 R Axis:   44 Text Interpretation: Sinus rhythm with sinus arrhythmia with short PR Confirmed by Nicanor Alcon, Rewa Weissberg ([id number]) on 03/25/2021 3:58:50 AM  Radiology DG Neck Soft Tissue  Result Date: 03/24/2021 CLINICAL DATA:  Difficulty swallowing for 3 weeks EXAM: NECK SOFT TISSUES - 1+ VIEW COMPARISON:  None. FINDINGS: Epiglottis and aryepiglottic folds are within normal limits. No prevertebral soft tissue changes are seen. No focal bony abnormality is noted. IMPRESSION: No acute abnormality noted. Electronically Signed   By: Alcide Clever M.D.   On: 03/24/2021 01:38   CT Soft Tissue Neck W Contrast  Result Date: 03/25/2021 CLINICAL DATA:  Initial  evaluation for dysphagia. EXAM: CT NECK WITH CONTRAST TECHNIQUE: Multidetector CT imaging of the neck was performed using the standard protocol following the bolus administration of intravenous contrast. CONTRAST:  75mL OMNIPAQUE IOHEXOL 300 MG/ML  SOLN COMPARISON:  Radiograph from 03/24/2021. FINDINGS: Pharynx and larynx: Oral cavity within normal limits. No acute abnormality about the dentition. Palatine tonsils symmetric and within normal limits. Nasopharynx and oropharynx within normal limits. Parapharyngeal fat maintained. No retropharyngeal collection or swelling. Epiglottis  normal. Vallecula largely clear. Remainder of the hypopharynx and supraglottic larynx within normal limits. Piriform sinuses are clear. Glottis within normal limits. Subglottic airway patent and clear. Visualized upper esophagus within normal limits. Salivary glands: Salivary glands including the parotid and submandibular glands are within normal limits. Thyroid: Normal. Lymph nodes: No enlarged or pathologic adenopathy within the neck. Vascular: Normal intravascular enhancement seen throughout the neck. Limited intracranial: Unremarkable. Visualized orbits: Unremarkable. Mastoids and visualized paranasal sinuses: Visualized paranasal sinuses are clear. Visualized mastoid air cells and middle ear cavities are well pneumatized and free of fluid. Skeleton: No discrete or worrisome osseous lesions. Mild cervical spondylosis noted at C5-6 and C6-7. No bulky anterior osteophytic spurring. Upper chest: Visualized upper chest demonstrates no significant finding, although better evaluated on concomitant CT of the chest. Other: Unremarkable. IMPRESSION: Negative CT of the neck. No findings to explain patient's symptoms identified. Electronically Signed   By: Rise Mu M.D.   On: 03/25/2021 02:05   CT Chest W Contrast  Result Date: 03/25/2021 CLINICAL DATA:  Chest pain with difficulty swallowing food. EXAM: CT CHEST WITH CONTRAST TECHNIQUE: Multidetector CT imaging of the chest was performed during intravenous contrast administration. CONTRAST:  75mL OMNIPAQUE IOHEXOL 300 MG/ML  SOLN COMPARISON:  None. FINDINGS: Cardiovascular: No significant vascular findings. Normal heart size. No pericardial effusion. Mediastinum/Nodes: No enlarged mediastinal, hilar, or axillary lymph nodes. Thyroid gland, trachea, and esophagus demonstrate no significant findings. Lungs/Pleura: Lungs are clear. No pleural effusion or pneumothorax. Upper Abdomen: There is diffuse fatty infiltration of the liver parenchyma. Musculoskeletal:  No chest wall abnormality. No acute or significant osseous findings. IMPRESSION: 1. No CT evidence of acute intrathoracic pathology. 2. Hepatic steatosis. Electronically Signed   By: Aram Candela M.D.   On: 03/25/2021 01:36    Procedures Procedures   Medications Ordered in ED Medications  iohexol (OMNIPAQUE) 300 MG/ML solution 75 mL (75 mLs Intravenous Contrast Given 03/25/21 0121)  alum & mag hydroxide-simeth (MAALOX/MYLANTA) 200-200-20 MG/5ML suspension 30 mL (30 mLs Oral Given 03/25/21 0424)  hyoscyamine (LEVSIN SL) SL tablet 0.125 mg (0.125 mg Sublingual Given 03/25/21 0541)    ED Course  I have reviewed the triage vital signs and the nursing notes.  Pertinent labs & imaging results that were available during my care of the patient were reviewed by me and considered in my medical decision making (see chart for details).   Ruled out for MI in the ED, heart score is 1 very low risk for MACE.  PERC negative wells 0 highly doubt PE in this low risk patient.  This is gerd with esophagitis and some degree of globus.  GERD friendly diet and restart carafate and follow up with GI  Alicia Parks was evaluated in Emergency Department on 03/25/2021 for the symptoms described in the history of present illness. She was evaluated in the context of the global COVID-19 pandemic, which necessitated consideration that the patient might be at risk for infection with the SARS-CoV-2 virus that causes COVID-19. Institutional protocols and algorithms that pertain to the  evaluation of patients at risk for COVID-19 are in a state of rapid change based on information released by regulatory bodies including the CDC and federal and state organizations. These policies and algorithms were followed during the patient's care in the ED.  Final Clinical Impression(s) / ED Diagnoses Final diagnoses:  Odynophagia  Gastroesophageal reflux disease with esophagitis without hemorrhage       Return for intractable cough,  coughing up blood, fevers > 100.4 unrelieved by medication, shortness of breath, intractable vomiting, chest pain, shortness of breath, weakness, numbness, changes in speech, facial asymmetry, abdominal pain, passing out, Inability to tolerate liquids or food, cough, altered mental status or any concerns. No signs of systemic illness or infection. The patient is nontoxic-appearing on exam and vital signs are within normal limits. I have reviewed the triage vital signs and the nursing notes. Pertinent labs & imaging results that were available during my care of the patient were reviewed by me and considered in my medical decision making (see chart for details). After history, exam, and medical workup I feel the patient has been appropriately medically screened and is safe for discharge home. Pertinent diagnoses were discussed with the patient. Patient was given return precautions. Luberta Robertson, Almer Littleton, MD 03/25/21 [phone]

## 2021-03-25 NOTE — ED Triage Notes (Signed)
Per EMS pt was brought in for substernal chest pain that moves to her stomach and back.  She was seen for trouble swallowing yesterday and released.    EMS gave 1 nitro , bp went from 214/120 to 120/70 HR 80, 12 lead unremarkable  20G left AC  While speaking to PA she indicated that she has a syncopal episode at home earlier in the evening.

## 2021-03-25 NOTE — ED Provider Notes (Signed)
Emergency Medicine Provider Triage Evaluation Note  Alicia Parks , a 45 y.o. female  was evaluated in triage.  Pt complains of persistent dysphagia, burning central midsternal chest pain. Symptoms persistent and waxing/waning x 3+ weeks. Was screened in the ED x 2 in the past 48 hours, but left prior to formal evaluation. Previously using Carafate, but has stopped this due to diarrheal side effects. EMS gave NTG x 1 PTA with change in BP from 214 > 120 systolic.   Review of Systems  Positive: Dysphagia, chest pain Negative: Drooling   Physical Exam  BP (!) 126/99 (BP Location: Left Arm)   Pulse 93   Temp 98.3 F (36.8 C) (Oral)   Resp 16   LMP 02/27/2021   SpO2 99%  Gen:   Awake, no distress   Resp:  Normal effort  MSK:   Moves extremities without difficulty  Other:  Tolerating secretions without difficulty. No voice muffling.   Medical Decision Making  Medically screening exam initiated at 12:48 AM.  Appropriate orders placed.  Alicia Parks was informed that the remainder of the evaluation will be completed by another provider, this initial triage assessment does not replace that evaluation, and the importance of remaining in the ED until their evaluation is complete.  Dysphagia, chest pain   Antony Madura, PA-C 03/25/21 0051    Tilden Fossa, MD 03/25/21 [phone]

## 2021-03-25 NOTE — ED Provider Notes (Signed)
[facility] EMERGENCY DEPT Provider Note   CSN: [id number] Arrival date & time: 03/25/21  1652     History Chief Complaint  Patient presents with  . Odynophagia     HARBOR PASTER is a 45 y.o. female.  45 year old female presents with complaint of difficulty or painful swallowing associated with panic attacks.  Patient states that a few weeks ago she was eating a oil and vinegar chip and felt like it got stuck in her throat, then panicked.  Since that time, if she eats anything more than a smoothie she starts to panic.  Does not feel like she has impacted food bolus, does not report regurgitation.  Patient has been seen twice in the ER in the past few days, has had a CT soft tissue neck as well as chest which have been negative for findings to explain her symptoms.  Patient was prescribed Carafate however states she does not have stomach ulcers so she is not taking this.  Has hydroxyzine for anxiety but is not taking it.  Patient has called GI for follow-up but is not able to schedule until November and hopes to have insurance at that time.      Past Medical History:  Diagnosis Date  . Back pain   . Complication of anesthesia   . Ear drum perforation   . Fibromyalgia   . GERD (gastroesophageal reflux disease)   . Migraine   . Restless leg syndrome     Patient Active Problem List   Diagnosis Date Noted  . Dental abscess 10/04/2020  . GERD (gastroesophageal reflux disease) 10/04/2020  . Generalized anxiety disorder 04/25/2020  . Bipolar I disorder, most recent episode depressed (HCC) 04/25/2020  . Moderate episode of recurrent major depressive disorder (HCC) 04/25/2020    Past Surgical History:  Procedure Laterality Date  . TONSILLECTOMY    . TUBAL LIGATION       OB History     Gravida  3   Para  3   Term  3   Preterm  0   AB  0   Living  3      SAB  0   IAB  0   Ectopic  0   Multiple  0   Live Births              Family  History  Problem Relation Age of Onset  . Hypertension Father   . Heart disease Father   . Gout Father   . Osteoporosis Mother   . Cancer Other     Social History   Tobacco Use  . Smoking status: Never  . Smokeless tobacco: Never  Substance Use Topics  . Alcohol use: No  . Drug use: Yes    Types: Marijuana    Home Medications Prior to Admission medications   Medication Sig Start Date End Date Taking? Authorizing Provider  hydrOXYzine (ATARAX) 10 MG/5ML syrup Take 12.5 mLs (25 mg total) by mouth 3 (three) times daily as needed for anxiety. 03/25/21  Yes Jeannie Fend, PA-C  lidocaine (XYLOCAINE) 2 % solution Use as directed 15 mLs in the mouth or throat every 6 (six) hours as needed for mouth pain. 03/25/21  Yes Jeannie Fend, PA-C  diphenhydrAMINE (SOMINEX) 25 MG tablet Take 25 mg by mouth daily as needed for allergies.    [provider]  hydrOXYzine (ATARAX/VISTARIL) 25 MG tablet TAKE 1 TABLET (25 MG TOTAL) BY MOUTH 4 (FOUR) TIMES DAILY AS NEEDED. Patient taking  differently: Take 25 mg by mouth every 6 (six) hours as needed for itching or anxiety. 02/13/21   Shanna Cisco, NP  ibuprofen (ADVIL) 200 MG tablet Take 600 mg by mouth every 6 (six) hours as needed for headache or moderate pain.    [provider]  ibuprofen (ADVIL) 800 MG tablet Take 1 tablet (800 mg total) by mouth 2 (two) times daily as needed. Patient not taking: Reported on 03/25/2021 10/03/20   Bing Neighbors, FNP  lurasidone (LATUDA) 20 MG TABS tablet Take 1 tablet (20 mg total) by mouth at bedtime. Patient not taking: Reported on 03/25/2021 03/08/21   Shanna Cisco, NP  norethindrone (CAMILA) 0.35 MG tablet take 1 tablet by mouth daily Patient not taking: Reported on 03/25/2021 12/28/20     pantoprazole (PROTONIX) 40 MG tablet Take 1 tablet (40 mg total) by mouth daily. 10/04/20   Storm Frisk, MD  QUEtiapine (SEROQUEL) 50 MG tablet Take 50 mg by mouth at bedtime.    [provider]  sucralfate (CARAFATE) 1 GM/10ML suspension Take 10 mLs (1 g total) by mouth 4 (four) times daily -  with meals and at bedtime. 03/25/21   Palumbo, April, MD  sucralfate (CARAFATE) 1 GM/10ML suspension Take 1 g by mouth 4 (four) times daily as needed (stomach ulcers).    [provider]  vitamin B-12 (CYANOCOBALAMIN) 1000 MCG tablet Take 1,000 mcg by mouth daily.    [provider]    Allergies    Aspirin, Compazine [prochlorperazine edisylate], Effexor [venlafaxine], and Tylenol [acetaminophen]  Review of Systems   Review of Systems  Constitutional:  Negative for chills and fever.  HENT:  Positive for trouble swallowing. Negative for sore throat and voice change.   Respiratory:  Positive for shortness of breath.   Cardiovascular:  Negative for chest pain.  Gastrointestinal:  Negative for abdominal pain, nausea and vomiting.  Musculoskeletal:  Positive for back pain. Negative for arthralgias and myalgias.  Skin:  Negative for wound.  Allergic/Immunologic: Negative for immunocompromised state.  Neurological:  Negative for weakness.  Psychiatric/Behavioral:  The patient is nervous/anxious.   All other systems reviewed and are negative.  Physical Exam Updated Vital Signs BP 111/71   Pulse 73   Temp 98.6 F (37 C)   Resp 16   LMP 02/27/2021   SpO2 99%   Physical Exam Vitals and nursing note reviewed.  Constitutional:      General: She is not in acute distress.    Appearance: She is well-developed. She is not diaphoretic.  HENT:     Head: Normocephalic and atraumatic.     Mouth/Throat:     Mouth: Mucous membranes are moist.     Pharynx: No oropharyngeal exudate or posterior oropharyngeal erythema.  Eyes:     Conjunctiva/sclera: Conjunctivae normal.  Pulmonary:     Effort: Pulmonary effort is normal.  Musculoskeletal:     Cervical back: Neck supple.  Lymphadenopathy:     Cervical: No cervical adenopathy.  Skin:    General: Skin is warm and  dry.  Neurological:     Mental Status: She is alert and oriented to person, place, and time.  Psychiatric:        Behavior: Behavior normal.    ED Results / Procedures / Treatments   Labs (all labs ordered are listed, but only abnormal results are displayed) Labs Reviewed  RESP PANEL BY RT-PCR (FLU A&B, COVID) [id number]    EKG None  Radiology DG Neck Soft  Tissue  Result Date: 03/24/2021 CLINICAL DATA:  Difficulty swallowing for 3 weeks EXAM: NECK SOFT TISSUES - 1+ VIEW COMPARISON:  None. FINDINGS: Epiglottis and aryepiglottic folds are within normal limits. No prevertebral soft tissue changes are seen. No focal bony abnormality is noted. IMPRESSION: No acute abnormality noted. Electronically Signed   By: Alcide Clever M.D.   On: 03/24/2021 01:38   CT Soft Tissue Neck W Contrast  Result Date: 03/25/2021 CLINICAL DATA:  Initial evaluation for dysphagia. EXAM: CT NECK WITH CONTRAST TECHNIQUE: Multidetector CT imaging of the neck was performed using the standard protocol following the bolus administration of intravenous contrast. CONTRAST:  46mL OMNIPAQUE IOHEXOL 300 MG/ML  SOLN COMPARISON:  Radiograph from 03/24/2021. FINDINGS: Pharynx and larynx: Oral cavity within normal limits. No acute abnormality about the dentition. Palatine tonsils symmetric and within normal limits. Nasopharynx and oropharynx within normal limits. Parapharyngeal fat maintained. No retropharyngeal collection or swelling. Epiglottis normal. Vallecula largely clear. Remainder of the hypopharynx and supraglottic larynx within normal limits. Piriform sinuses are clear. Glottis within normal limits. Subglottic airway patent and clear. Visualized upper esophagus within normal limits. Salivary glands: Salivary glands including the parotid and submandibular glands are within normal limits. Thyroid: Normal. Lymph nodes: No enlarged or pathologic adenopathy within the neck. Vascular: Normal intravascular enhancement seen throughout the  neck. Limited intracranial: Unremarkable. Visualized orbits: Unremarkable. Mastoids and visualized paranasal sinuses: Visualized paranasal sinuses are clear. Visualized mastoid air cells and middle ear cavities are well pneumatized and free of fluid. Skeleton: No discrete or worrisome osseous lesions. Mild cervical spondylosis noted at C5-6 and C6-7. No bulky anterior osteophytic spurring. Upper chest: Visualized upper chest demonstrates no significant finding, although better evaluated on concomitant CT of the chest. Other: Unremarkable. IMPRESSION: Negative CT of the neck. No findings to explain patient's symptoms identified. Electronically Signed   By: Rise Mu M.D.   On: 03/25/2021 02:05   CT Chest W Contrast  Result Date: 03/25/2021 CLINICAL DATA:  Chest pain with difficulty swallowing food. EXAM: CT CHEST WITH CONTRAST TECHNIQUE: Multidetector CT imaging of the chest was performed during intravenous contrast administration. CONTRAST:  23mL OMNIPAQUE IOHEXOL 300 MG/ML  SOLN COMPARISON:  None. FINDINGS: Cardiovascular: No significant vascular findings. Normal heart size. No pericardial effusion. Mediastinum/Nodes: No enlarged mediastinal, hilar, or axillary lymph nodes. Thyroid gland, trachea, and esophagus demonstrate no significant findings. Lungs/Pleura: Lungs are clear. No pleural effusion or pneumothorax. Upper Abdomen: There is diffuse fatty infiltration of the liver parenchyma. Musculoskeletal: No chest wall abnormality. No acute or significant osseous findings. IMPRESSION: 1. No CT evidence of acute intrathoracic pathology. 2. Hepatic steatosis. Electronically Signed   By: Aram Candela M.D.   On: 03/25/2021 01:36    Procedures Procedures   Medications Ordered in ED Medications  lidocaine (XYLOCAINE) 2 % viscous mouth solution 15 mL (15 mLs Mouth/Throat Given 03/25/21 2123)    ED Course  I have reviewed the triage vital signs and the nursing notes.  Pertinent labs &  imaging results that were available during my care of the patient were reviewed by me and considered in my medical decision making (see chart for details).  Clinical Course as of 03/25/21 2133  Mon Mar 25, 2021  [id number] 45 year old female with concern for anxiety attack as above.  Reviewed patient's CT results with her.  No findings in her ER work-up to explain her symptoms however strongly encouraged follow-up with GI for further evaluation.  Patient may benefit from a swallow study or upper endoscopy if deemed  necessary by GI. In the meantime, we will try viscous lidocaine to see if this will help numb her throat and make swallowing less uncomfortable for her.  Encouraged her to take the Carafate.  Given liquid hydroxyzine she is having difficulty swallowing and is concerned about swallowing pills. [LM]    Clinical Course User Index [LM] Alden Hipp   MDM Rules/Calculators/A&P                           Final Clinical Impression(s) / ED Diagnoses Final diagnoses:  Dysphagia, unspecified type    Rx / DC Orders ED Discharge Orders          Ordered    hydrOXYzine (ATARAX) 10 MG/5ML syrup  3 times daily PRN        03/25/21 2118    lidocaine (XYLOCAINE) 2 % solution  Every 6 hours PRN        03/25/21 2120             Jeannie Fend, PA-C 03/25/21 2133    Vanetta Mulders, MD 03/26/21 [id number]

## 2021-03-26 ENCOUNTER — Telehealth (HOSPITAL_COMMUNITY): Payer: Self-pay | Admitting: *Deleted

## 2021-03-26 ENCOUNTER — Other Ambulatory Visit: Payer: Self-pay

## 2021-03-26 NOTE — Telephone Encounter (Signed)
VM from patient stating over the weekend she went to the ED three times because she was having difficulty swallowing. She has a GI dr appt in the future now, but wanted [place] to know she stopped all of her psych meds but the Vistaril because she read up on it and believes the Latuda and the Seroquel gave her the side affect of dysphagia. Will let Dr Doyne Keel know.

## 2021-03-26 NOTE — Telephone Encounter (Signed)
Thank you for letting provider know.  Patient has an upcoming appointment in November where medication adjustments can be made.  If patient needs to see provider prior to that she can come in doing walk-in hours on Mondays and Tuesdays between hours of 8 AM and 11 AM.

## 2021-03-27 ENCOUNTER — Other Ambulatory Visit: Payer: Self-pay

## 2021-03-28 ENCOUNTER — Ambulatory Visit
Admission: EM | Admit: 2021-03-28 | Discharge: 2021-03-28 | Disposition: A | Discharge status: Home / Self Care | Payer: BLUE CROSS/BLUE SHIELD | Attending: Urgent Care | Admitting: Urgent Care

## 2021-03-28 ENCOUNTER — Other Ambulatory Visit: Payer: Self-pay

## 2021-03-28 DIAGNOSIS — R1310 Dysphagia, unspecified: Secondary | ICD-10-CM

## 2021-03-28 DIAGNOSIS — K219 Gastro-esophageal reflux disease without esophagitis: Secondary | ICD-10-CM | POA: Diagnosis not present

## 2021-03-28 MED ORDER — FAMOTIDINE 40 MG/5ML PO SUSR
20.0000 mg | Freq: Two times a day (BID) | ORAL | 0 refills | Status: DC
  Filled 2021-03-28: qty 100, 20d supply, fill #0

## 2021-03-28 MED ORDER — ESOMEPRAZOLE MAGNESIUM 20 MG PO PACK
20.0000 mg | PACK | Freq: Two times a day (BID) | ORAL | 0 refills | Status: DC
  Filled 2021-03-28: qty 60, 30d supply, fill #0

## 2021-03-28 NOTE — ED Provider Notes (Signed)
[facility]   MRN: [id number] DOB: [birth year]  Subjective:   Alicia Parks is a 45 y.o. female presenting for persistent painful swallowing. Has globus sensation or feels that she injured her esophagus from swallowing a potato chip. Has had multiple visits and no definitive answer despite extensive testing. Results as below. Has an appointment next week with a GI specialist through [facility].   No current facility-administered medications for this encounter.  Current Outpatient Medications:    diphenhydrAMINE (SOMINEX) 25 MG tablet, Take 25 mg by mouth daily as needed for allergies., Disp: , Rfl:    hydrOXYzine (ATARAX) 10 MG/5ML syrup, Take 12.5 mLs (25 mg total) by mouth 3 (three) times daily as needed for anxiety., Disp: 240 mL, Rfl: 0   hydrOXYzine (ATARAX/VISTARIL) 25 MG tablet, TAKE 1 TABLET (25 MG TOTAL) BY MOUTH 4 (FOUR) TIMES DAILY AS NEEDED. (Patient taking differently: Take 25 mg by mouth every 6 (six) hours as needed for itching or anxiety.), Disp: 90 tablet, Rfl: 3   ibuprofen (ADVIL) 200 MG tablet, Take 600 mg by mouth every 6 (six) hours as needed for headache or moderate pain., Disp: , Rfl:    ibuprofen (ADVIL) 800 MG tablet, Take 1 tablet (800 mg total) by mouth 2 (two) times daily as needed. (Patient not taking: Reported on 03/25/2021), Disp: 21 tablet, Rfl: 0   lidocaine (XYLOCAINE) 2 % solution, Use as directed 15 mLs in the mouth or throat every 6 (six) hours as needed for mouth pain., Disp: 100 mL, Rfl: 0   lurasidone (LATUDA) 20 MG TABS tablet, Take 1 tablet (20 mg total) by mouth at bedtime. (Patient not taking: Reported on 03/25/2021), Disp: 20 tablet, Rfl: 3   norethindrone (CAMILA) 0.35 MG tablet, take 1 tablet by mouth daily (Patient not taking: Reported on 03/25/2021), Disp: 84 tablet, Rfl: 0   pantoprazole (PROTONIX) 40 MG tablet, Take 1 tablet (40 mg total) by mouth daily., Disp: 30 tablet, Rfl: 3   QUEtiapine (SEROQUEL) 50 MG tablet, Take 50 mg  by mouth at bedtime., Disp: , Rfl:    sucralfate (CARAFATE) 1 GM/10ML suspension, Take 10 mLs (1 g total) by mouth 4 (four) times daily -  with meals and at bedtime., Disp: 420 mL, Rfl: 0   sucralfate (CARAFATE) 1 GM/10ML suspension, Take 1 g by mouth 4 (four) times daily as needed (stomach ulcers)., Disp: , Rfl:    vitamin B-12 (CYANOCOBALAMIN) 1000 MCG tablet, Take 1,000 mcg by mouth daily., Disp: , Rfl:    Allergies  Allergen Reactions   Aspirin Other (See Comments)    "gives me the jitters"   Compazine [Prochlorperazine Edisylate] Other (See Comments)    Makes body move funny. Shaking   Effexor [Venlafaxine]     Nightmares and night sweats   Tylenol [Acetaminophen] Nausea Only and Rash    Past Medical History:  Diagnosis Date   Back pain    Complication of anesthesia    Ear drum perforation    Fibromyalgia    GERD (gastroesophageal reflux disease)    Migraine    Restless leg syndrome      Past Surgical History:  Procedure Laterality Date   TONSILLECTOMY     TUBAL LIGATION      Family History  Problem Relation Age of Onset   Hypertension Father    Heart disease Father    Gout Father    Osteoporosis Mother    Cancer Other     Social History   Tobacco Use  Smoking status: Never   Smokeless tobacco: Never  Substance Use Topics   Alcohol use: No   Drug use: Yes    Types: Marijuana    ROS   Objective:   Vitals: BP (!) 165/112 (BP Location: Left Arm)   Pulse 83   Temp 97.8 F (36.6 C) (Oral)   Resp 18   LMP 02/27/2021   SpO2 98%   BP recheck was 132/88.  Physical Exam Constitutional:      General: She is not in acute distress.    Appearance: Normal appearance. She is well-developed. She is not ill-appearing, toxic-appearing or diaphoretic.  HENT:     Head: Normocephalic and atraumatic.     Right Ear: No drainage or tenderness. No middle ear effusion. Tympanic membrane is not erythematous.     Left Ear: No drainage or tenderness.  No middle ear  effusion. Tympanic membrane is not erythematous.     Nose: Nose normal. No congestion or rhinorrhea.     Mouth/Throat:     Mouth: Mucous membranes are moist. No oral lesions.     Pharynx: Oropharynx is clear. No pharyngeal swelling, oropharyngeal exudate, posterior oropharyngeal erythema or uvula swelling.     Tonsils: No tonsillar exudate or tonsillar abscesses.  Eyes:     General: No scleral icterus.       Right eye: No discharge.        Left eye: No discharge.     Extraocular Movements: Extraocular movements intact.     Right eye: Normal extraocular motion.     Left eye: Normal extraocular motion.     Conjunctiva/sclera: Conjunctivae normal.     Pupils: Pupils are equal, round, and reactive to light.  Cardiovascular:     Rate and Rhythm: Normal rate and regular rhythm.     Pulses: Normal pulses.     Heart sounds: Normal heart sounds. No murmur heard.   No friction rub. No gallop.  Pulmonary:     Effort: Pulmonary effort is normal. No respiratory distress.     Breath sounds: Normal breath sounds. No stridor. No wheezing, rhonchi or rales.  Musculoskeletal:     Cervical back: Normal range of motion and neck supple.  Lymphadenopathy:     Cervical: No cervical adenopathy.  Skin:    General: Skin is warm and dry.     Findings: No rash.  Neurological:     General: No focal deficit present.     Mental Status: She is alert and oriented to person, place, and time.  Psychiatric:        Mood and Affect: Mood normal.        Behavior: Behavior normal.        Thought Content: Thought content normal.        Judgment: Judgment normal.    DG Neck Soft Tissue  Result Date: 03/24/2021 CLINICAL DATA:  Difficulty swallowing for 3 weeks EXAM: NECK SOFT TISSUES - 1+ VIEW COMPARISON:  None. FINDINGS: Epiglottis and aryepiglottic folds are within normal limits. No prevertebral soft tissue changes are seen. No focal bony abnormality is noted. IMPRESSION: No acute abnormality noted. Electronically  Signed   By: Alcide Clever M.D.   On: 03/24/2021 01:38   DG Chest 2 View  Result Date: 03/16/2021 CLINICAL DATA:  Chest pain EXAM: CHEST - 2 VIEW COMPARISON:  09/02/2020 FINDINGS: The heart size and mediastinal contours are within normal limits. Both lungs are clear. The visualized skeletal structures are unremarkable. IMPRESSION: No active cardiopulmonary disease. Electronically Signed  By: Duanne Guess D.O.   On: 03/16/2021 18:06   DG Abdomen 1 View  Result Date: 01/01/2021 CLINICAL DATA:  Upper abdominal pain and swelling EXAM: ABDOMEN - 1 VIEW COMPARISON:  None. FINDINGS: The bowel gas pattern is normal. No radio-opaque calculi or other significant radiographic abnormality are seen. IMPRESSION: Negative. Electronically Signed   By: Paulina Fusi M.D.   On: 01/01/2021 13:49   CT Soft Tissue Neck W Contrast  Result Date: 03/25/2021 CLINICAL DATA:  Initial evaluation for dysphagia. EXAM: CT NECK WITH CONTRAST TECHNIQUE: Multidetector CT imaging of the neck was performed using the standard protocol following the bolus administration of intravenous contrast. CONTRAST:  75mL OMNIPAQUE IOHEXOL 300 MG/ML  SOLN COMPARISON:  Radiograph from 03/24/2021. FINDINGS: Pharynx and larynx: Oral cavity within normal limits. No acute abnormality about the dentition. Palatine tonsils symmetric and within normal limits. Nasopharynx and oropharynx within normal limits. Parapharyngeal fat maintained. No retropharyngeal collection or swelling. Epiglottis normal. Vallecula largely clear. Remainder of the hypopharynx and supraglottic larynx within normal limits. Piriform sinuses are clear. Glottis within normal limits. Subglottic airway patent and clear. Visualized upper esophagus within normal limits. Salivary glands: Salivary glands including the parotid and submandibular glands are within normal limits. Thyroid: Normal. Lymph nodes: No enlarged or pathologic adenopathy within the neck. Vascular: Normal intravascular  enhancement seen throughout the neck. Limited intracranial: Unremarkable. Visualized orbits: Unremarkable. Mastoids and visualized paranasal sinuses: Visualized paranasal sinuses are clear. Visualized mastoid air cells and middle ear cavities are well pneumatized and free of fluid. Skeleton: No discrete or worrisome osseous lesions. Mild cervical spondylosis noted at C5-6 and C6-7. No bulky anterior osteophytic spurring. Upper chest: Visualized upper chest demonstrates no significant finding, although better evaluated on concomitant CT of the chest. Other: Unremarkable. IMPRESSION: Negative CT of the neck. No findings to explain patient's symptoms identified. Electronically Signed   By: Rise Mu M.D.   On: 03/25/2021 02:05   CT Chest W Contrast  Result Date: 03/25/2021 CLINICAL DATA:  Chest pain with difficulty swallowing food. EXAM: CT CHEST WITH CONTRAST TECHNIQUE: Multidetector CT imaging of the chest was performed during intravenous contrast administration. CONTRAST:  75mL OMNIPAQUE IOHEXOL 300 MG/ML  SOLN COMPARISON:  None. FINDINGS: Cardiovascular: No significant vascular findings. Normal heart size. No pericardial effusion. Mediastinum/Nodes: No enlarged mediastinal, hilar, or axillary lymph nodes. Thyroid gland, trachea, and esophagus demonstrate no significant findings. Lungs/Pleura: Lungs are clear. No pleural effusion or pneumothorax. Upper Abdomen: There is diffuse fatty infiltration of the liver parenchyma. Musculoskeletal: No chest wall abnormality. No acute or significant osseous findings. IMPRESSION: 1. No CT evidence of acute intrathoracic pathology. 2. Hepatic steatosis. Electronically Signed   By: Aram Candela M.D.   On: 03/25/2021 01:36    Recent Results (from the past 2160 hour(s))  POC Urinalysis dipstick     Status: Abnormal   Collection Time: 01/01/21  1:07 PM  Result Value Ref Range   Glucose, UA NEGATIVE NEGATIVE mg/dL   Bilirubin Urine NEGATIVE NEGATIVE    Ketones, ur NEGATIVE NEGATIVE mg/dL   Specific Gravity, Urine 1.020 1.005 - 1.030   Hgb urine dipstick SMALL (A) NEGATIVE   pH 5.0 5.0 - 8.0   Protein, ur NEGATIVE NEGATIVE mg/dL   Urobilinogen, UA 0.2 0.0 - 1.0 mg/dL   Nitrite NEGATIVE NEGATIVE   Leukocytes,Ua TRACE (A) NEGATIVE    Comment: Biochemical Testing Only. Please order routine urinalysis from main lab if confirmatory testing is needed.  POC urine pregnancy     Status: None  Collection Time: 01/01/21  1:07 PM  Result Value Ref Range   Preg Test, Ur NEGATIVE NEGATIVE    Comment:        THE SENSITIVITY OF THIS METHODOLOGY IS >24 mIU/mL   Urine culture     Status: Abnormal   Collection Time: 01/01/21  1:08 PM   Specimen: Urine, Clean Catch  Result Value Ref Range   Specimen Description URINE, CLEAN CATCH    Special Requests      NONE Performed at [facility], [address], [place], [place] [address]    Culture MULTIPLE SPECIES PRESENT, SUGGEST RECOLLECTION (A)    Report Status 01/02/2021 FINAL   CBC with Differential     Status: Abnormal   Collection Time: 03/16/21  5:35 PM  Result Value Ref Range   WBC 14.7 (H) 4.0 - 10.5 K/uL   RBC 4.75 3.87 - 5.11 MIL/uL   Hemoglobin 14.3 12.0 - 15.0 g/dL   HCT 01.3 14.3 - 88.8 %   MCV 88.4 80.0 - 100.0 fL   MCH 30.1 26.0 - 34.0 pg   MCHC 34.0 30.0 - 36.0 g/dL   RDW 75.7 97.2 - 82.0 %   Platelets 382 150 - 400 K/uL   nRBC 0.0 0.0 - 0.2 %   Neutrophils Relative % 62 %   Neutro Abs 9.1 (H) 1.7 - 7.7 K/uL   Lymphocytes Relative 31 %   Lymphs Abs 4.5 (H) 0.7 - 4.0 K/uL   Monocytes Relative 6 %   Monocytes Absolute 0.8 0.1 - 1.0 K/uL   Eosinophils Relative 1 %   Eosinophils Absolute 0.1 0.0 - 0.5 K/uL   Basophils Relative 0 %   Basophils Absolute 0.0 0.0 - 0.1 K/uL   Immature Granulocytes 0 %   Abs Immature Granulocytes 0.04 0.00 - 0.07 K/uL    Comment: Performed at [facility], [address] [place], [place] [address]  Comprehensive  metabolic panel     Status: Abnormal   Collection Time: 03/16/21  5:35 PM  Result Value Ref Range   Sodium 136 135 - 145 mmol/L   Potassium 3.7 3.5 - 5.1 mmol/L   Chloride 103 98 - 111 mmol/L   CO2 25 22 - 32 mmol/L   Glucose, Bld 94 70 - 99 mg/dL    Comment: Glucose reference range applies only to samples taken after fasting for at least 8 hours.   BUN 9 6 - 20 mg/dL   Creatinine, Ser 1.53 0.44 - 1.00 mg/dL   Calcium 9.4 8.9 - 79.4 mg/dL   Total Protein 7.5 6.5 - 8.1 g/dL   Albumin 4.4 3.5 - 5.0 g/dL   AST 14 (L) 15 - 41 U/L   ALT 18 0 - 44 U/L   Alkaline Phosphatase 51 38 - 126 U/L   Total Bilirubin 0.4 0.3 - 1.2 mg/dL   GFR, Estimated >32 >76 mL/min    Comment: (NOTE) Calculated using the CKD-EPI Creatinine Equation (2021)    Anion gap 8 5 - 15    Comment: Performed at [facility], [address], [place], [place] [address]  Lipase, blood     Status: None   Collection Time: 03/16/21  5:35 PM  Result Value Ref Range   Lipase 45 11 - 51 U/L    Comment: Performed at [facility], [address], [place], [place] [address]  hCG, serum, qualitative     Status: None   Collection Time: 03/16/21  5:35 PM  Result  Value Ref Range   Preg, Serum NEGATIVE NEGATIVE    Comment:        THE SENSITIVITY OF THIS METHODOLOGY IS >10 mIU/mL. Performed at [facility], [address] [place], [place] [address]   Troponin I (High Sensitivity)     Status: None   Collection Time: 03/16/21  5:35 PM  Result Value Ref Range   Troponin I (High Sensitivity) <2 <18 ng/L    Comment: (NOTE) Elevated high sensitivity troponin I (hsTnI) values and significant  changes across serial measurements may suggest ACS but many other  chronic and acute conditions are known to elevate hsTnI results.  Refer to the "Links" section for chest pain algorithms and additional  guidance. Performed at [facility], [address], [place], [place] [address]   CBC with Differential     Status: Abnormal   Collection Time: 03/23/21  2:37 PM  Result Value Ref Range   WBC 12.6 (H) 4.0 - 10.5 K/uL   RBC 4.61 3.87 - 5.11 MIL/uL   Hemoglobin 14.0 12.0 - 15.0 g/dL   HCT 73.5 32.9 - 92.4 %   MCV 89.4 80.0 - 100.0 fL   MCH 30.4 26.0 - 34.0 pg   MCHC 34.0 30.0 - 36.0 g/dL   RDW 26.8 34.1 - 96.2 %   Platelets 384 150 - 400 K/uL   nRBC 0.0 0.0 - 0.2 %   Neutrophils Relative % 61 %   Neutro Abs 7.7 1.7 - 7.7 K/uL   Lymphocytes Relative 32 %   Lymphs Abs 4.0 0.7 - 4.0 K/uL   Monocytes Relative 6 %   Monocytes Absolute 0.7 0.1 - 1.0 K/uL   Eosinophils Relative 1 %   Eosinophils Absolute 0.2 0.0 - 0.5 K/uL   Basophils Relative 0 %   Basophils Absolute 0.0 0.0 - 0.1 K/uL   Immature Granulocytes 0 %   Abs Immature Granulocytes 0.05 0.00 - 0.07 K/uL    Comment: Performed at [facility], [address], [place], [place] [address]  Comprehensive metabolic panel     Status: Abnormal   Collection Time: 03/23/21  2:37 PM  Result Value Ref Range   Sodium 140 135 - 145 mmol/L   Potassium 3.8 3.5 - 5.1 mmol/L   Chloride 105 98 - 111 mmol/L   CO2 27 22 - 32 mmol/L   Glucose, Bld 102 (H) 70 - 99 mg/dL    Comment: Glucose reference range applies only to samples taken after fasting for at least 8 hours.   BUN 13 6 - 20 mg/dL   Creatinine, Ser 8.92 0.44 - 1.00 mg/dL   Calcium 9.1 8.9 - 11.9 mg/dL   Total Protein 7.8 6.5 - 8.1 g/dL   Albumin 4.4 3.5 - 5.0 g/dL   AST 19 15 - 41 U/L   ALT 25 0 - 44 U/L   Alkaline Phosphatase 57 38 - 126 U/L   Total Bilirubin 0.7 0.3 - 1.2 mg/dL   GFR, Estimated >41 >74 mL/min    Comment: (NOTE) Calculated using the CKD-EPI Creatinine Equation (2021)    Anion gap 8 5 - 15    Comment: Performed at [facility], [address], [place], [place] [address]  CBC with Differential     Status: Abnormal   Collection Time: 03/25/21 12:40 AM  Result Value Ref  Range   WBC 14.8 (H) 4.0 - 10.5 K/uL   RBC 4.66 3.87 - 5.11 MIL/uL   Hemoglobin 14.1  12.0 - 15.0 g/dL   HCT 16.1 09.6 - 04.5 %   MCV 88.8 80.0 - 100.0 fL   MCH 30.3 26.0 - 34.0 pg   MCHC 34.1 30.0 - 36.0 g/dL   RDW 40.9 81.1 - 91.4 %   Platelets 422 (H) 150 - 400 K/uL   nRBC 0.0 0.0 - 0.2 %   Neutrophils Relative % 64 %   Neutro Abs 9.4 (H) 1.7 - 7.7 K/uL   Lymphocytes Relative 29 %   Lymphs Abs 4.3 (H) 0.7 - 4.0 K/uL   Monocytes Relative 6 %   Monocytes Absolute 0.8 0.1 - 1.0 K/uL   Eosinophils Relative 1 %   Eosinophils Absolute 0.1 0.0 - 0.5 K/uL   Basophils Relative 0 %   Basophils Absolute 0.0 0.0 - 0.1 K/uL   Immature Granulocytes 0 %   Abs Immature Granulocytes 0.06 0.00 - 0.07 K/uL    Comment: Performed at [facility], [address], [place], [place] [address]  Comprehensive metabolic panel     Status: Abnormal   Collection Time: 03/25/21 12:40 AM  Result Value Ref Range   Sodium 136 135 - 145 mmol/L   Potassium 3.3 (L) 3.5 - 5.1 mmol/L   Chloride 104 98 - 111 mmol/L   CO2 23 22 - 32 mmol/L   Glucose, Bld 121 (H) 70 - 99 mg/dL    Comment: Glucose reference range applies only to samples taken after fasting for at least 8 hours.   BUN 6 6 - 20 mg/dL   Creatinine, Ser 6.21 0.44 - 1.00 mg/dL   Calcium 9.4 8.9 - 30.8 mg/dL   Total Protein 7.5 6.5 - 8.1 g/dL   Albumin 4.0 3.5 - 5.0 g/dL   AST 18 15 - 41 U/L   ALT 24 0 - 44 U/L   Alkaline Phosphatase 51 38 - 126 U/L   Total Bilirubin 0.9 0.3 - 1.2 mg/dL   GFR, Estimated >65 >78 mL/min    Comment: (NOTE) Calculated using the CKD-EPI Creatinine Equation (2021)    Anion gap 9 5 - 15    Comment: Performed at [facility], [address], [place], [place] [address]  Troponin I (High Sensitivity)     Status: None   Collection Time: 03/25/21 12:40 AM  Result Value Ref Range   Troponin I (High Sensitivity) 4 <18 ng/L    Comment: (NOTE) Elevated high sensitivity troponin I (hsTnI) values and significant   changes across serial measurements may suggest ACS but many other  chronic and acute conditions are known to elevate hsTnI results.  Refer to the "Links" section for chest pain algorithms and additional  guidance. Performed at [facility], [address], [place], [place] [address]   I-Stat Beta hCG blood, ED ([facility], [facility], [facility] only)     Status: None   Collection Time: 03/25/21  1:01 AM  Result Value Ref Range   I-stat hCG, quantitative <5.0 <5 mIU/mL   Comment 3            Comment:   GEST. AGE      CONC.  (mIU/mL)   <=1 WEEK        5 - 50     2 WEEKS       50 - 500     3 WEEKS       100 - 10,000     4 WEEKS     1,000 - 30,000  FEMALE AND NON-PREGNANT FEMALE:     LESS THAN 5 mIU/mL   Troponin I (High Sensitivity)     Status: None   Collection Time: 03/25/21  5:38 AM  Result Value Ref Range   Troponin I (High Sensitivity) 4 <18 ng/L    Comment: (NOTE) Elevated high sensitivity troponin I (hsTnI) values and significant  changes across serial measurements may suggest ACS but many other  chronic and acute conditions are known to elevate hsTnI results.  Refer to the "Links" section for chest pain algorithms and additional  guidance. Performed at [facility], [address] [place], [place] [address]   Resp Panel by RT-PCR (Flu A&B, Covid) Nasopharyngeal Swab     Status: None   Collection Time: 03/25/21  5:09 PM   Specimen: Nasopharyngeal Swab; Nasopharyngeal(NP) swabs in vial transport medium  Result Value Ref Range   SARS Coronavirus 2 by RT PCR NEGATIVE NEGATIVE    Comment: (NOTE) SARS-CoV-2 target nucleic acids are NOT DETECTED.  The SARS-CoV-2 RNA is generally detectable in upper respiratory specimens during the acute phase of infection. The lowest concentration of SARS-CoV-2 viral copies this assay can detect is 138 copies/mL. A negative result does not preclude SARS-Cov-2 infection and should not be used as the sole basis for treatment or other patient  management decisions. A negative result may occur with  improper specimen collection/handling, submission of specimen other than nasopharyngeal swab, presence of viral mutation(s) within the areas targeted by this assay, and inadequate number of viral copies(<138 copies/mL). A negative result must be combined with clinical observations, patient history, and epidemiological information. The expected result is Negative.  Fact Sheet for Patients:  BloggerCourse.com  Fact Sheet for Healthcare Providers:  SeriousBroker.it  This test is no t yet approved or cleared by the [place] FDA and  has been authorized for detection and/or diagnosis of SARS-CoV-2 by FDA under an Emergency Use Authorization (EUA). This EUA will remain  in effect (meaning this test can be used) for the duration of the COVID-19 declaration under Section 564(b)(1) of the Act, 21 U.S.C.section 360bbb-3(b)(1), unless the authorization is terminated  or revoked sooner.       Influenza A by PCR NEGATIVE NEGATIVE   Influenza B by PCR NEGATIVE NEGATIVE    Comment: (NOTE) The Xpert Xpress SARS-CoV-2/FLU/RSV plus assay is intended as an aid in the diagnosis of influenza from Nasopharyngeal swab specimens and should not be used as a sole basis for treatment. Nasal washings and aspirates are unacceptable for Xpert Xpress SARS-CoV-2/FLU/RSV testing.  Fact Sheet for Patients: BloggerCourse.com  Fact Sheet for Healthcare Providers: SeriousBroker.it  This test is not yet approved or cleared by the [place] FDA and has been authorized for detection and/or diagnosis of SARS-CoV-2 by FDA under an Emergency Use Authorization (EUA). This EUA will remain in effect (meaning this test can be used) for the duration of the COVID-19 declaration under Section 564(b)(1) of the Act, 21 U.S.C. section 360bbb-3(b)(1), unless the  authorization is terminated or revoked.  Performed at [facility], [address] [place], [place] [address]      Assessment and Plan :   PDMP not reviewed this encounter.  1. Dysphagia, unspecified type   2. Gastroesophageal reflux disease, unspecified whether esophagitis present     Given her history of GERD will restart her on liquid solution of famotidine, PPI. No signs of an acute process requiring recheck and retesting through ER. Emphasized need to maintain her appointment with the  GI specialist. Counseled patient on potential for adverse effects with medications prescribed/recommended today, ER and return-to-clinic precautions discussed, patient verbalized understanding.    Wallis Bamberg, [place] 03/29/21 [phone]

## 2021-03-28 NOTE — ED Triage Notes (Signed)
Pt c/o difficulty swallowing after having a chip stuck in her throat 3wks ago. States unable to swallow food, doing liquids only. States seen in ED for same x3.

## 2021-03-29 ENCOUNTER — Other Ambulatory Visit: Payer: Self-pay

## 2021-04-01 ENCOUNTER — Encounter: Payer: BLUE CROSS/BLUE SHIELD | Admitting: Obstetrics & Gynecology

## 2021-04-07 ENCOUNTER — Emergency Department (HOSPITAL_BASED_OUTPATIENT_CLINIC_OR_DEPARTMENT_OTHER): Payer: BLUE CROSS/BLUE SHIELD | Admitting: Radiology

## 2021-04-07 ENCOUNTER — Encounter (HOSPITAL_BASED_OUTPATIENT_CLINIC_OR_DEPARTMENT_OTHER): Payer: Self-pay | Admitting: Obstetrics and Gynecology

## 2021-04-07 ENCOUNTER — Other Ambulatory Visit: Payer: Self-pay

## 2021-04-07 ENCOUNTER — Emergency Department (HOSPITAL_BASED_OUTPATIENT_CLINIC_OR_DEPARTMENT_OTHER): Payer: BLUE CROSS/BLUE SHIELD

## 2021-04-07 ENCOUNTER — Emergency Department (HOSPITAL_BASED_OUTPATIENT_CLINIC_OR_DEPARTMENT_OTHER)
Admission: EM | Admit: 2021-04-07 | Discharge: 2021-04-07 | Disposition: A | Discharge status: Home / Self Care | Payer: BLUE CROSS/BLUE SHIELD | Attending: Emergency Medicine | Admitting: Emergency Medicine

## 2021-04-07 DIAGNOSIS — K8020 Calculus of gallbladder without cholecystitis without obstruction: Secondary | ICD-10-CM | POA: Insufficient documentation

## 2021-04-07 DIAGNOSIS — K760 Fatty (change of) liver, not elsewhere classified: Secondary | ICD-10-CM | POA: Insufficient documentation

## 2021-04-07 DIAGNOSIS — R1012 Left upper quadrant pain: Secondary | ICD-10-CM | POA: Diagnosis present

## 2021-04-07 DIAGNOSIS — K828 Other specified diseases of gallbladder: Secondary | ICD-10-CM

## 2021-04-07 DIAGNOSIS — R1011 Right upper quadrant pain: Secondary | ICD-10-CM

## 2021-04-07 LAB — BASIC METABOLIC PANEL
Anion gap: 12 (ref 5–15)
BUN: 8 mg/dL (ref 6–20)
CO2: 23 mmol/L (ref 22–32)
Calcium: 9.8 mg/dL (ref 8.9–10.3)
Chloride: 103 mmol/L (ref 98–111)
Creatinine, Ser: 0.62 mg/dL (ref 0.44–1.00)
GFR, Estimated: >60 mL/min (ref >60)
Glucose, Bld: 89 mg/dL (ref 70–99)
Potassium: 4.5 mmol/L (ref 3.5–5.1)
Sodium: 138 mmol/L (ref 135–145)

## 2021-04-07 LAB — PREGNANCY, URINE: Preg Test, Ur: NEGATIVE

## 2021-04-07 LAB — HEPATIC FUNCTION PANEL
ALT: 40 U/L (ref 0–44)
AST: 28 U/L (ref 15–41)
Albumin: 4.5 g/dL (ref 3.5–5.0)
Alkaline Phosphatase: 53 U/L (ref 38–126)
Bilirubin, Direct: 0.1 mg/dL (ref 0.0–0.2)
Indirect Bilirubin: 0.5 mg/dL (ref 0.3–0.9)
Total Bilirubin: 0.6 mg/dL (ref 0.3–1.2)
Total Protein: 7.4 g/dL (ref 6.5–8.1)

## 2021-04-07 LAB — URINALYSIS, ROUTINE W REFLEX MICROSCOPIC
Bilirubin Urine: NEGATIVE
Glucose, UA: NEGATIVE mg/dL
Hgb urine dipstick: NEGATIVE
Ketones, ur: 40 mg/dL — AB
Leukocytes,Ua: NEGATIVE
Nitrite: NEGATIVE
Protein, ur: NEGATIVE mg/dL
Specific Gravity, Urine: 1.024 (ref 1.005–1.030)
pH: 5.5 (ref 5.0–8.0)

## 2021-04-07 LAB — CBC
HCT: 43.4 % (ref 36.0–46.0)
Hemoglobin: 14.8 g/dL (ref 12.0–15.0)
MCH: 30.4 pg (ref 26.0–34.0)
MCHC: 34.1 g/dL (ref 30.0–36.0)
MCV: 89.1 fL (ref 80.0–100.0)
Platelets: 402 10*3/uL — ABNORMAL HIGH (ref 150–400)
RBC: 4.87 MIL/uL (ref 3.87–5.11)
RDW: 13 % (ref 11.5–15.5)
WBC: 12.1 10*3/uL — ABNORMAL HIGH (ref 4.0–10.5)
nRBC: 0 % (ref 0.0–0.2)

## 2021-04-07 LAB — TROPONIN I (HIGH SENSITIVITY)
Troponin I (High Sensitivity): <2 ng/L (ref <18)
Troponin I (High Sensitivity): 2 ng/L (ref <18)

## 2021-04-07 LAB — LIPASE, BLOOD: Lipase: 30 U/L (ref 11–51)

## 2021-04-07 NOTE — ED Provider Notes (Signed)
[facility] EMERGENCY DEPT Provider Note   CSN: [id number] Arrival date & time: 04/07/21  1330     History Chief Complaint  Patient presents with   Dysphagia   Tingling   Hypertension    Alicia Parks is a 45 y.o. female.   Abdominal Pain Pain location:  LUQ, RUQ and epigastric Pain quality: aching   Pain radiates to:  Epigastric region Pain severity:  Severe Onset quality:  Gradual Duration: many. Timing:  Intermittent Progression:  Waxing and waning Context: previous surgery (recent esophageal dilation)   Relieved by:  Nothing Worsened by:  Eating Ineffective treatments:  None tried Associated symptoms: chest pain   Associated symptoms: no chills, no constipation, no cough, no diarrhea, no dysuria, no fatigue, no fever, no nausea, no shortness of breath and no vomiting       Past Medical History:  Diagnosis Date   Back pain    Complication of anesthesia    Ear drum perforation    Fibromyalgia    GERD (gastroesophageal reflux disease)    Migraine    Restless leg syndrome     Patient Active Problem List   Diagnosis Date Noted   Dental abscess 10/04/2020   GERD (gastroesophageal reflux disease) 10/04/2020   Generalized anxiety disorder 04/25/2020   Bipolar I disorder, most recent episode depressed (HCC) 04/25/2020   Moderate episode of recurrent major depressive disorder (HCC) 04/25/2020    Past Surgical History:  Procedure Laterality Date   TONSILLECTOMY     TUBAL LIGATION       OB History     Gravida  3   Para  3   Term  3   Preterm  0   AB  0   Living  3      SAB  0   IAB  0   Ectopic  0   Multiple  0   Live Births              Family History  Problem Relation Age of Onset   Hypertension Father    Heart disease Father    Gout Father    Osteoporosis Mother    Cancer Other     Social History   Tobacco Use   Smoking status: Never   Smokeless tobacco: Never  Substance Use Topics   Alcohol use:  No   Drug use: Yes    Types: Marijuana    Home Medications Prior to Admission medications   Medication Sig Start Date End Date Taking? Authorizing Provider  diphenhydrAMINE (SOMINEX) 25 MG tablet Take 25 mg by mouth daily as needed for allergies.    [provider]  esomeprazole (NEXIUM) 20 MG packet Mix 1 packet (20 mg) with water and drink by mouth in the morning and at bedtime. 03/28/21   Wallis Bamberg, PA-C  famotidine (PEPCID) 40 MG/5ML suspension Take 2.5 mLs (20 mg total) by mouth 2 (two) times daily. 03/28/21   Wallis Bamberg, PA-C  hydrOXYzine (ATARAX) 10 MG/5ML syrup Take 12.5 mLs (25 mg total) by mouth 3 (three) times daily as needed for anxiety. 03/25/21   Jeannie Fend, PA-C  hydrOXYzine (ATARAX/VISTARIL) 25 MG tablet TAKE 1 TABLET (25 MG TOTAL) BY MOUTH 4 (FOUR) TIMES DAILY AS NEEDED. Patient taking differently: Take 25 mg by mouth every 6 (six) hours as needed for itching or anxiety. 02/13/21   Shanna Cisco, NP  ibuprofen (ADVIL) 200 MG tablet Take 600 mg by mouth every 6 (six) hours as needed  for headache or moderate pain.    [provider]  ibuprofen (ADVIL) 800 MG tablet Take 1 tablet (800 mg total) by mouth 2 (two) times daily as needed. Patient not taking: Reported on 03/25/2021 10/03/20   Bing Neighbors, FNP  lidocaine (XYLOCAINE) 2 % solution Use as directed 15 mLs in the mouth or throat every 6 (six) hours as needed for mouth pain. 03/25/21   Jeannie Fend, PA-C  lurasidone (LATUDA) 20 MG TABS tablet Take 1 tablet (20 mg total) by mouth at bedtime. Patient not taking: Reported on 03/25/2021 03/08/21   Shanna Cisco, NP  norethindrone (CAMILA) 0.35 MG tablet take 1 tablet by mouth daily Patient not taking: Reported on 03/25/2021 12/28/20     pantoprazole (PROTONIX) 40 MG tablet Take 1 tablet (40 mg total) by mouth daily. 10/04/20   Storm Frisk, MD  QUEtiapine (SEROQUEL) 50 MG tablet Take 50 mg by mouth at bedtime.    [provider]   sucralfate (CARAFATE) 1 GM/10ML suspension Take 10 mLs (1 g total) by mouth 4 (four) times daily -  with meals and at bedtime. 03/25/21   Palumbo, April, MD  sucralfate (CARAFATE) 1 GM/10ML suspension Take 1 g by mouth 4 (four) times daily as needed (stomach ulcers).    [provider]  vitamin B-12 (CYANOCOBALAMIN) 1000 MCG tablet Take 1,000 mcg by mouth daily.    [provider]    Allergies    Aspirin, Compazine [prochlorperazine edisylate], Effexor [venlafaxine], and Tylenol [acetaminophen]  Review of Systems   Review of Systems  Constitutional:  Negative for chills, diaphoresis, fatigue and fever.  HENT:  Negative for congestion.   Respiratory:  Negative for cough, chest tightness, shortness of breath and wheezing.   Cardiovascular:  Positive for chest pain. Negative for palpitations and leg swelling.  Gastrointestinal:  Positive for abdominal pain. Negative for abdominal distention, constipation, diarrhea, nausea and vomiting.  Genitourinary:  Positive for flank pain. Negative for dysuria.  Musculoskeletal:  Negative for back pain, neck pain and neck stiffness.  Skin:  Negative for rash and wound.  Neurological:  Negative for headaches.  Psychiatric/Behavioral:  Negative for agitation and confusion.   All other systems reviewed and are negative.  Physical Exam Updated Vital Signs BP (!) 129/96   Pulse 71   Temp 98 F (36.7 C)   Resp 16   Ht 5\' 4"  (1.626 m)   Wt 87.1 kg   LMP 03/23/2021 (Approximate)   SpO2 99%   BMI 32.96 kg/m   Physical Exam Vitals and nursing note reviewed.  Constitutional:      General: She is not in acute distress.    Appearance: She is well-developed. She is not ill-appearing, toxic-appearing or diaphoretic.  HENT:     Head: Normocephalic and atraumatic.     Nose: No congestion.     Mouth/Throat:     Mouth: Mucous membranes are moist.  Eyes:     Conjunctiva/sclera: Conjunctivae normal.  Cardiovascular:     Rate and  Rhythm: Normal rate and regular rhythm.     Heart sounds: No murmur heard. Pulmonary:     Effort: Pulmonary effort is normal. No respiratory distress.     Breath sounds: Normal breath sounds. No wheezing, rhonchi or rales.  Chest:     Chest wall: No tenderness.  Abdominal:     General: Abdomen is flat.     Palpations: Abdomen is soft.     Tenderness: There is abdominal tenderness in the  right upper quadrant, epigastric area and left upper quadrant. There is no right CVA tenderness, left CVA tenderness, guarding or rebound.  Musculoskeletal:        General: No tenderness.     Cervical back: Neck supple. No tenderness.  Skin:    General: Skin is warm and dry.     Capillary Refill: Capillary refill takes less than 2 seconds.     Findings: No erythema or rash.  Neurological:     General: No focal deficit present.     Mental Status: She is alert.  Psychiatric:        Mood and Affect: Mood normal.    ED Results / Procedures / Treatments   Labs (all labs ordered are listed, but only abnormal results are displayed) Labs Reviewed  CBC - Abnormal; Notable for the following components:      Result Value   WBC 12.1 (*)    Platelets 402 (*)    All other components within normal limits  URINALYSIS, ROUTINE W REFLEX MICROSCOPIC - Abnormal; Notable for the following components:   Ketones, ur 40 (*)    All other components within normal limits  URINE CULTURE  BASIC METABOLIC PANEL  PREGNANCY, URINE  HEPATIC FUNCTION PANEL  LIPASE, BLOOD  TROPONIN I (HIGH SENSITIVITY)  TROPONIN I (HIGH SENSITIVITY)    EKG EKG Interpretation  Date/Time:  Sunday April 07 2021 13:43:42 EDT Ventricular Rate:  78 PR Interval:  120 QRS Duration: 90 QT Interval:  368 QTC Calculation: 419 R Axis:   77 Text Interpretation: Normal sinus rhythm Normal ECG Confirmed by Alvester Chou [phone]) on 04/07/2021 1:53:07 PM  Radiology DG Chest 2 View  Result Date: 04/07/2021 CLINICAL DATA:  Patient reports  to the ER after endoscopy, states she has been having right sided tingling in her arm that has turned into pain and has continued following a recent endoscopy EXAM: CHEST - 2 VIEW COMPARISON:  Chest radiograph 03/16/2021 FINDINGS: The cardiomediastinal contours are within normal limits. The lungs are clear. No pneumothorax or pleural effusion. No acute finding in the visualized skeleton. IMPRESSION: No acute cardiopulmonary process. Electronically Signed   By: Emmaline Kluver M.D.   On: 04/07/2021 14:28   US Abdomen Limited RUQ (LIVER/GB)  Result Date: 04/07/2021 CLINICAL DATA:  Right upper quadrant pain for several weeks, nausea EXAM: ULTRASOUND ABDOMEN LIMITED RIGHT UPPER QUADRANT COMPARISON:  Abdominal ultrasound 12/31/2016 FINDINGS: Gallbladder: Small mobile gallstones are seen within the gallbladder lumen measuring up to 0.8 cm as well as layering sludge. There is no gallbladder wall thickening or pericholecystic fluid. No sonographic Eulah Pont sign was reported by the sonographer. Common bile duct: Diameter: 2 mm Liver: Parenchymal echogenicity is diffusely increased. No focal lesion is identified. Portal vein is patent on color Doppler imaging with normal direction of blood flow towards the liver. Other: None. IMPRESSION: 1. Cholelithiasis and gallbladder sludge without evidence of acute cholecystitis. 2. Hepatic steatosis. Electronically Signed   By: Lesia Hausen M.D.   On: 04/07/2021 16:05    Procedures Procedures   Medications Ordered in ED Medications - No data to display  ED Course  I have reviewed the triage vital signs and the nursing notes.  Pertinent labs & imaging results that were available during my care of the patient were reviewed by me and considered in my medical decision making (see chart for details).    MDM Rules/Calculators/A&P  DAYELIN BALDUCCI is a 45 y.o. female with a past medical history significant for anxiety, depression, fibromyalgia,  migraines, GERD, and recent esophageal stretching procedure 3 days ago who is presenting with right upper quadrant and upper abdominal discomfort.  Patient reports that she has been seen numerous times over the last few weeks for difficulty with swallowing and dysphagia after injuring her throat with a salt and vinegar chip.  She reports that she finally was able to get endoscopy several days ago and was told that there was a stricture that had to be stretched.  She reports that since the stretching she has not tried to eat anything too hard and is following up with her GI team for this.  The swallowing troubles are not bothering her today and she is more focused on the upper abdominal discomfort that she has been neglecting due to the esophageal problems.  She is saying that for the last few weeks and more mildly even longer has had pain in her right upper quadrant and her epigastric area.  She reports that it gets severe at times and is worse after eating.  She is unsure if she has any family history of gallbladder disease and thought she may have been told she had fatty liver disease at some point.  She reports that it has not worsened significantly after the procedure but it is bothering her today.  She reports that the discomfort drives her anxiety and stress and she is now will take medications.  On exam, patient does have tenderness in her right upper quadrant, epigastric area, and left upper quadrant.  Bowel sounds were appreciated.  Lungs were clear and chest was nontender.  I did not appreciate any stridor.  Normal speech.  Good pulses in extremities.  Flank and back nontender.  No rash to suggest shingles.  Clinically I do suspect we need to rule out cholecystitis or symptomatic cholelithiasis.  We will add on blood work to assess the liver, as well as her pancreas given the left upper quadrant discomfort as well.  We will get urinalysis as she reports she had a UTI a month or 2 ago and had some  flank pain with that.  She also says the pain goes into her chest and is concerned about her heart so she already had a chest x-ray which was reassuring but we will add on troponins.  EKG was reassuring.  Anticipate if work-up is reassuring she will be safe for discharge home to follow-up with her PCP and GI team.`    Ultrasound shows evidence of cholelithiasis and sludge but no evidence of acute cholecystitis.  Other labs similar to prior.  Suspect symptomatic cholelithiasis and gallbladder cause of the pain.  We discussed all the findings and feel she is appropriate for discharge home and she agrees.  She will follow-up with general surgery and the PCP and had no other questions or concerns.  Patient discharged in good condition   Final Clinical Impression(s) / ED Diagnoses Final diagnoses:  RUQ abdominal pain  Calculus of gallbladder without cholecystitis without obstruction  Sludge in gallbladder     Clinical Impression: 1. Calculus of gallbladder without cholecystitis without obstruction   2. RUQ abdominal pain   3. Sludge in gallbladder     Disposition: Discharge  Condition: Good  I have discussed the results, Dx and Tx plan with the pt(& family if present). He/she/they expressed understanding and agree(s) with the plan. Discharge instructions discussed at great length. Strict return  precautions discussed and pt &/or family have verbalized understanding of the instructions. No further questions at time of discharge.    New Prescriptions   No medications on file    Follow Up: Surgery, [facility] [address] [place] [address] [phone]     [facility] [address] [place] [address] [phone]       Randle Shatzer, Canary Brim, MD 04/07/21 [phone]

## 2021-04-07 NOTE — ED Triage Notes (Signed)
Patient reports to the ER after endoscopy, states she has been having right sided tingling in her arm that has turned into pain and has continued following a recent endoscopy.

## 2021-04-07 NOTE — Discharge Instructions (Signed)
Your history, exam, work-up today are consistent with sludge and gallstones in her gallbladder but no evidence of acute cholecystitis.  I suspect that given the timeline of waxing and waning symptoms in your right upper quadrant that this has been a problem for quite some time.  You did not have evidence of acute infection or inflammation that would need emergent surgery however, we do recommend following with outpatient general surgery to discuss further management.  Please rest and stay hydrated as best you can.  If any symptoms change or worsen acutely, please return to the nearest emergency department.

## 2021-04-08 LAB — URINE CULTURE

## 2021-04-23 ENCOUNTER — Ambulatory Visit: Payer: Self-pay | Admitting: Surgery

## 2021-04-23 NOTE — H&P (Signed)
History of Present Illness: Alicia Parks is a 45 y.o. female who was referred to me for evaluation of gallstones.  She was recently seen in the ED on 10/16 with epigastric and right upper quadrant abdominal pain.  Several days prior to that she had undergone an EGD with dilation of an esophageal stricture (her gastroenterologist is Dr. Loreta Ave).  In the ED LFTs were normal.  An ultrasound showed gallstones with some sludge in the gallbladder, but no signs of acute cholecystitis.  Her symptoms improved and she was discharged and referred today for outpatient follow-up to discuss cholecystectomy.   Today she says that for the last approximately 5 years she has had intermittent upper abdominal pain.  It is in the right flank and radiates around to the back, and also occasionally occurs in the left flank.  It gets worse after eating, and also with certain movements.  She has been trying to avoid spicy and fatty foods.     Review of Systems: A complete review of systems was obtained from the patient.  I have reviewed this information and discussed as appropriate with the patient.  See HPI as well for other ROS.       Medical History: Past Medical History Past Medical History: Diagnosis Date  Anxiety    Fibromyalgia    GERD (gastroesophageal reflux disease)    Hypertension        Patient Active Problem List Diagnosis  Calculus of gallbladder without cholecystitis without obstruction     Past Surgical History Past Surgical History: Procedure Laterality Date  MINI-LAPAROTOMY W/ TUBAL LIGATION      TONSILLECTOMY          Allergies Allergies Allergen Reactions  Aspirin Other (See Comments)     "gives me the jitters"  Prochlorperazine Edisylate Other (See Comments)     Makes body move funny. Shaking  Venlafaxine Unknown     Nightmares and night sweats  Acetaminophen Nausea and Rash      Current Outpatient Medications on File Prior to  Visit Medication Sig Dispense Refill  esomeprazole (NEXIUM) 20 mg packet Take by mouth      famotidine (PEPCID) 40 mg/5 mL (8 mg/mL) suspension Take by mouth      hydrOXYzine (ATARAX) 10 mg/5 mL syrup Take by mouth      lidocaine (XYLOCAINE) 2 % solution Use as directed 15 mLs in the mouth or throat every 6 (six) hours as needed for mouth pain.      lurasidone (LATUDA) 20 mg tablet Take by mouth      norethindrone (MICRONOR) 0.35 mg tablet Take 1 tablet by mouth once daily      pantoprazole (PROTONIX) 40 MG DR tablet Take 40 mg by mouth once daily      sucralfate (CARAFATE) 100 mg/mL suspension Take by mouth      cephalexin (KEFLEX) 250 mg/5 mL suspension TAKE 10 MILLILITER BY MOUTH TWO TIMES DAILY      cyanocobalamin (VITAMIN B12) 1000 MCG tablet Take by mouth      ibuprofen (MOTRIN) 200 MG tablet Take by mouth      QUEtiapine (SEROQUEL) 50 MG tablet Take by mouth       No current facility-administered medications on file prior to visit.     Family History Family History Problem Relation Age of Onset  Hyperlipidemia (Elevated cholesterol) Father    High blood pressure (Hypertension) Father    Coronary Artery Disease (Blocked arteries around heart) Father  Social History   Tobacco Use Smoking Status Never Smoker Smokeless Tobacco Never Used     Social History Social History    Socioeconomic History  Marital status: Married Tobacco Use  Smoking status: Never Smoker  Smokeless tobacco: Never Used [employer], mining Use: Never used Substance and Sexual Activity  Alcohol use: Not Currently  Drug use: Defer  Sexual activity: Defer      Objective:     Vitals:   04/23/21 0905 BP: (!) 142/84 Pulse: 102 Temp: 36.2 C (97.2 F) SpO2: 99% Weight: 87.5 kg (192 lb 12.8 oz) Height: 162.6 cm (5\' 4" )   Body mass index is 33.09 kg/m.   Physical Exam Vitals reviewed.  Constitutional:      General: She is not in acute distress.    Appearance: Normal  appearance.  HENT:     Head: Normocephalic and atraumatic.  Eyes:     General: No scleral icterus.    Conjunctiva/sclera: Conjunctivae normal.  Cardiovascular:     Rate and Rhythm: Normal rate and regular rhythm.     Heart sounds: No murmur heard. Pulmonary:     Effort: Pulmonary effort is normal. No respiratory distress.     Breath sounds: Normal breath sounds.  Abdominal:     General: There is no distension.     Palpations: Abdomen is soft.     Comments: Mildly tender to palpation of right upper quadrant, no rebound tenderness or guarding.  Well-healed periumbilical scar.  Musculoskeletal:        General: Normal range of motion.     Cervical back: Normal range of motion.  Skin:    General: Skin is warm and dry.     Coloration: Skin is not jaundiced.  Neurological:     General: No focal deficit present.     Mental Status: She is alert and oriented to person, place, and time.  Psychiatric:        Mood and Affect: Mood normal.        Behavior: Behavior normal.        Thought Content: Thought content normal.            Assessment and Plan: Diagnoses and all orders for this visit:   Calculus of gallbladder without cholecystitis without obstruction       This is a 45 year old female presenting with upper abdominal and right upper quadrant pain.  I reviewed her ultrasound, which shows several small stones as well as sludge within the gallbladder.  I think that some of her symptoms are consistent with biliary colic although some of her pain seems more musculoskeletal and may not be related to the gallbladder.  Laparoscopic cholecystectomy was recommended. The details of this procedure were discussed with the patient, including the risks of bleeding, infection, bile leak, and <0.5% risk of common bile duct injury. The patient expressed understanding and agrees to proceed with surgery. I did discuss that some of her pain, particularly her left sided pain that occurs with movement, is  probably not related to the gallbladder and will thus not be relieved by cholecystectomy. She expressed understanding and wishes to proceed with surgery.  Michaelle Birks, MD [facility] General, Hepatobiliary and Pancreatic Surgery 04/23/21 9:37 AM

## 2021-04-24 ENCOUNTER — Ambulatory Visit
Admission: EM | Admit: 2021-04-24 | Discharge: 2021-04-24 | Discharge status: Against Medical Advice | Payer: BLUE CROSS/BLUE SHIELD

## 2021-04-24 ENCOUNTER — Other Ambulatory Visit: Payer: Self-pay

## 2021-04-25 ENCOUNTER — Other Ambulatory Visit (HOSPITAL_BASED_OUTPATIENT_CLINIC_OR_DEPARTMENT_OTHER): Payer: Self-pay

## 2021-04-25 ENCOUNTER — Emergency Department (HOSPITAL_BASED_OUTPATIENT_CLINIC_OR_DEPARTMENT_OTHER): Payer: BLUE CROSS/BLUE SHIELD

## 2021-04-25 ENCOUNTER — Encounter (HOSPITAL_BASED_OUTPATIENT_CLINIC_OR_DEPARTMENT_OTHER): Payer: Self-pay | Admitting: Emergency Medicine

## 2021-04-25 ENCOUNTER — Other Ambulatory Visit: Payer: Self-pay

## 2021-04-25 ENCOUNTER — Emergency Department (HOSPITAL_BASED_OUTPATIENT_CLINIC_OR_DEPARTMENT_OTHER)
Admission: EM | Admit: 2021-04-25 | Discharge: 2021-04-25 | Disposition: A | Discharge status: Home / Self Care | Payer: BLUE CROSS/BLUE SHIELD | Attending: Emergency Medicine | Admitting: Emergency Medicine

## 2021-04-25 DIAGNOSIS — K219 Gastro-esophageal reflux disease without esophagitis: Secondary | ICD-10-CM | POA: Insufficient documentation

## 2021-04-25 DIAGNOSIS — Z20822 Contact with and (suspected) exposure to covid-19: Secondary | ICD-10-CM | POA: Insufficient documentation

## 2021-04-25 DIAGNOSIS — K8020 Calculus of gallbladder without cholecystitis without obstruction: Secondary | ICD-10-CM | POA: Insufficient documentation

## 2021-04-25 DIAGNOSIS — R1011 Right upper quadrant pain: Secondary | ICD-10-CM | POA: Diagnosis present

## 2021-04-25 LAB — RESP PANEL BY RT-PCR (FLU A&B, COVID) ARPGX2
Influenza A by PCR: NEGATIVE
Influenza B by PCR: NEGATIVE
SARS Coronavirus 2 by RT PCR: NEGATIVE

## 2021-04-25 LAB — COMPREHENSIVE METABOLIC PANEL
ALT: 29 U/L (ref 0–44)
AST: 19 U/L (ref 15–41)
Albumin: 4 g/dL (ref 3.5–5.0)
Alkaline Phosphatase: 41 U/L (ref 38–126)
Anion gap: 8 (ref 5–15)
BUN: 10 mg/dL (ref 6–20)
CO2: 26 mmol/L (ref 22–32)
Calcium: 8.6 mg/dL — ABNORMAL LOW (ref 8.9–10.3)
Chloride: 105 mmol/L (ref 98–111)
Creatinine, Ser: 0.69 mg/dL (ref 0.44–1.00)
GFR, Estimated: >60 mL/min (ref >60)
Glucose, Bld: 102 mg/dL — ABNORMAL HIGH (ref 70–99)
Potassium: 3.8 mmol/L (ref 3.5–5.1)
Sodium: 139 mmol/L (ref 135–145)
Total Bilirubin: 0.5 mg/dL (ref 0.3–1.2)
Total Protein: 6.7 g/dL (ref 6.5–8.1)

## 2021-04-25 LAB — CBC WITH DIFFERENTIAL/PLATELET
Abs Immature Granulocytes: 0.05 10*3/uL (ref 0.00–0.07)
Basophils Absolute: 0 10*3/uL (ref 0.0–0.1)
Basophils Relative: 0 %
Eosinophils Absolute: 0.1 10*3/uL (ref 0.0–0.5)
Eosinophils Relative: 1 %
HCT: 43.7 % (ref 36.0–46.0)
Hemoglobin: 14.8 g/dL (ref 12.0–15.0)
Immature Granulocytes: 0 %
Lymphocytes Relative: 32 %
Lymphs Abs: 4 10*3/uL (ref 0.7–4.0)
MCH: 30.4 pg (ref 26.0–34.0)
MCHC: 33.9 g/dL (ref 30.0–36.0)
MCV: 89.7 fL (ref 80.0–100.0)
Monocytes Absolute: 0.6 10*3/uL (ref 0.1–1.0)
Monocytes Relative: 5 %
Neutro Abs: 8 10*3/uL — ABNORMAL HIGH (ref 1.7–7.7)
Neutrophils Relative %: 62 %
Platelets: 367 10*3/uL (ref 150–400)
RBC: 4.87 MIL/uL (ref 3.87–5.11)
RDW: 13.3 % (ref 11.5–15.5)
WBC: 12.8 10*3/uL — ABNORMAL HIGH (ref 4.0–10.5)
nRBC: 0 % (ref 0.0–0.2)

## 2021-04-25 MED ORDER — URSODIOL NICU ORAL SYRINGE 60 MG/ML
10.0000 mg/kg/d | Freq: Three times a day (TID) | ORAL | 0 refills | Status: AC
  Filled 2021-04-25: qty 432, 30d supply, fill #0

## 2021-04-25 MED ORDER — URSODIOL 300 MG PO CAPS
300.0000 mg | ORAL_CAPSULE | Freq: Three times a day (TID) | ORAL | 0 refills | Status: DC
  Filled 2021-04-25 – 2021-04-26 (×2): qty 90, 30d supply, fill #0

## 2021-04-25 NOTE — ED Notes (Signed)
Patient transported to Ultrasound 

## 2021-04-25 NOTE — ED Notes (Signed)
States waiting to get gale bladder surgery but unable to get booked due to money.  States last couple of days symptoms of vomiting; pain and diarrhea has gotten worse.

## 2021-04-25 NOTE — ED Triage Notes (Signed)
Right abds pain, known gallbladder stones . Unable to have surgery due to finances .  N, V , diarrheas today .

## 2021-04-25 NOTE — ED Provider Notes (Signed)
[facility] EMERGENCY DEPT Provider Note   CSN: [id number] Arrival date & time: 04/25/21  1029     History Chief Complaint  Patient presents with   Abdominal Pain    RUQ    Alicia Parks is a 45 y.o. female.   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 Is currently tolerating oral intake. No nausea or vomiting. Endorses worsening RUQ pain.  Patient was previously seen in the emergency department in 04/07/2021 and diagnosed with cholelithiasis without cholecystitis.  She followed up outpatient at [facility] with general surgery on 04/23/2021 and outpatient surgical management of her cholelithiasis was planned.  She continues to endorse right upper quadrant pain that radiates to her back.  No significant change in the character of her pain.  She presents today because she states that she cannot afford surgery at this time.  She is requesting medical management of her cholelithiasis with ursodiol.  Past Medical History:  Diagnosis Date   Back pain    Complication of anesthesia    Ear drum perforation    Fibromyalgia    GERD (gastroesophageal reflux disease)    Migraine    Restless leg syndrome     Patient Active Problem List   Diagnosis Date Noted   Dental abscess 10/04/2020   GERD (gastroesophageal reflux disease) 10/04/2020   Generalized anxiety disorder 04/25/2020   Bipolar I disorder, most recent episode depressed ([place]) 04/25/2020   Moderate episode of recurrent major depressive disorder ([place]) 04/25/2020    Past Surgical History:  Procedure Laterality Date   TONSILLECTOMY     TUBAL LIGATION       OB History     Gravida  3   Para  3   Term  3   Preterm  0   AB  0   Living  3      SAB  0   IAB  0    Ectopic  0   Multiple  0   Live Births              Family History  Problem Relation Age of Onset   Hypertension Father    Heart disease Father    Gout Father    Osteoporosis Mother    Cancer Other     Social History   Tobacco Use   Smoking status: Never   Smokeless tobacco: Never  Substance Use Topics   Alcohol use: No   Drug use: Yes    Types: Marijuana    Home Medications Prior to Admission medications   Medication Sig Start Date End Date Taking? Authorizing Provider  esomeprazole (NEXIUM) 20 MG packet Mix 1 packet (20 mg) with water and drink by mouth in the morning and at bedtime. 03/28/21  Yes Jaynee Eagles, PA-C  famotidine (PEPCID) 40 MG/5ML suspension Take 2.5 mLs (20 mg total) by mouth 2 (two) times daily. 03/28/21  Yes Jaynee Eagles, PA-C  hydrOXYzine (ATARAX/VISTARIL) 25 MG tablet TAKE 1 TABLET (25 MG TOTAL) BY MOUTH 4 (FOUR) TIMES DAILY AS NEEDED. Patient taking differently: Take 25 mg by mouth every 6 (six) hours as needed for itching or anxiety. 02/13/21  Yes Eulis Canner E, NP  ursodiol (ACTIGALL) 60 mg/mL Take 4.8 mLs (288 mg total) by mouth 3 (  three) times daily. 04/25/21 05/25/21 Yes Regan Lemming, MD  diphenhydrAMINE (SOMINEX) 25 MG tablet Take 25 mg by mouth daily as needed for allergies.    [provider]  hydrOXYzine (ATARAX) 10 MG/5ML syrup Take 12.5 mLs (25 mg total) by mouth 3 (three) times daily as needed for anxiety. Patient not taking: Reported on 04/25/2021 03/25/21   Suella Broad A, PA-C  lidocaine (XYLOCAINE) 2 % solution Use as directed 15 mLs in the mouth or throat every 6 (six) hours as needed for mouth pain. 03/25/21   Tacy Learn, PA-C  sucralfate (CARAFATE) 1 GM/10ML suspension Take 10 mLs (1 g total) by mouth 4 (four) times daily -  with meals and at bedtime. 03/25/21   Palumbo, April, MD  vitamin B-12 (CYANOCOBALAMIN) 1000 MCG tablet Take 1,000 mcg by mouth daily.    [provider]    Allergies    Aspirin,  Compazine [prochlorperazine edisylate], Effexor [venlafaxine], and Tylenol [acetaminophen]  Review of Systems   Review of Systems  Constitutional:  Negative for chills and fever.  HENT:  Negative for ear pain and sore throat.   Eyes:  Negative for pain and visual disturbance.  Respiratory:  Negative for cough and shortness of breath.   Cardiovascular:  Negative for chest pain and palpitations.  Gastrointestinal:  Positive for abdominal pain. Negative for vomiting.  Genitourinary:  Negative for dysuria and hematuria.  Musculoskeletal:  Negative for arthralgias and back pain.  Skin:  Negative for color change and rash.  Neurological:  Negative for seizures and syncope.  All other systems reviewed and are negative.  Physical Exam Updated Vital Signs BP 121/78 (BP Location: Left Arm)   Pulse 68   Temp 98 F (36.7 C)   Resp 18   Ht 5\' 4"  (1.626 m)   Wt 86.6 kg   SpO2 100%   BMI 32.79 kg/m   Physical Exam Vitals and nursing note reviewed.  Constitutional:      General: She is not in acute distress.    Appearance: She is well-developed.  HENT:     Head: Normocephalic and atraumatic.  Eyes:     Conjunctiva/sclera: Conjunctivae normal.  Cardiovascular:     Rate and Rhythm: Normal rate and regular rhythm.     Heart sounds: No murmur heard. Pulmonary:     Effort: Pulmonary effort is normal. No respiratory distress.     Breath sounds: Normal breath sounds.  Abdominal:     Palpations: Abdomen is soft.     Tenderness: There is abdominal tenderness in the right upper quadrant. There is no guarding or rebound. Positive signs include Murphy's sign.  Musculoskeletal:     Cervical back: Neck supple.  Skin:    General: Skin is warm and dry.  Neurological:     Mental Status: She is alert.    ED Results / Procedures / Treatments   Labs (all labs ordered are listed, but only abnormal results are displayed) Labs Reviewed  COMPREHENSIVE METABOLIC PANEL - Abnormal; Notable for the  following components:      Result Value   Glucose, Bld 102 (*)    Calcium 8.6 (*)    All other components within normal limits  CBC WITH DIFFERENTIAL/PLATELET - Abnormal; Notable for the following components:   WBC 12.8 (*)    Neutro Abs 8.0 (*)    All other components within normal limits  RESP PANEL BY RT-PCR (FLU A&B, COVID) ARPGX2  PREGNANCY, URINE  URINALYSIS, ROUTINE W REFLEX MICROSCOPIC  EKG None  Radiology US Abdomen Limited RUQ (LIVER/GB)  Result Date: 04/25/2021 CLINICAL DATA:  Right upper quadrant pain.  Known cholelithiasis. EXAM: ULTRASOUND ABDOMEN LIMITED RIGHT UPPER QUADRANT COMPARISON:  04/07/2021 FINDINGS: Gallbladder: Mild cholelithiasis. No wall thickening visualized. No sonographic Murphy sign noted by sonographer. Common bile duct: Diameter: 5 mm Liver: No focal lesion. Diffusely increased parenchymal echogenicity. Portal vein is patent on color Doppler imaging with normal direction of blood flow towards the liver. Other: None. IMPRESSION: 1. Mild cholelithiasis. 2. Diffuse increased echogenicity of the hepatic parenchyma is a nonspecific indicator of hepatocellular dysfunction, most commonly steatosis. Electronically Signed   By: Acquanetta Belling M.D.   On: 04/25/2021 13:48    Procedures Procedures   Medications Ordered in ED Medications - No data to display  ED Course  I have reviewed the triage vital signs and the nursing notes.  Pertinent labs & imaging results that were available during my care of the patient were reviewed by me and considered in my medical decision making (see chart for details).    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 Is currently tolerating oral intake. No nausea or vomiting. Endorses worsening RUQ pain.  Patient was previously seen in the emergency department in 04/07/2021 and diagnosed with  cholelithiasis without cholecystitis.  She followed up outpatient at [facility] with general surgery on 04/23/2021 and outpatient surgical management of her cholelithiasis was planned.  She continues to endorse right upper quadrant pain that radiates to her back.  No significant change in the character of her pain.  She presents today because she states that she cannot afford surgery at this time.  She is requesting medical management of her cholelithiasis with ursodiol.  On arrival, the patient was vitally stable.  Right upper quadrant tenderness palpation noted on physical exam.  No rebound or guarding.  Slightly positive Murphy sign.  Will obtain repeat ultrasound of the gallbladder and screening labs to evaluate for cholecystitis.  Patient had a mildly cytosis 12.8, no hepatobiliary dysfunction on CMP.  She is COVID and influenza negative. Right upper quadrant ultrasound revealed mild cholelithiasis, evidence of possible hepatic steatosis, no evidence of acute cholecystitis.  Negative sonographic Murphy sign.  CBD was noted to be 5 mm in diameter.  The patient already has outpatient follow-up and is currently being managed outpatient by a general surgeon.  We will start the patient on ursodiol as she currently is having problems affording surgery.  We will have the patient follow-up outpatient with her general surgeon for continued management.  Return precautions provided.  Final Clinical Impression(s) / ED Diagnoses Final diagnoses:  RUQ pain  Calculus of gallbladder without cholecystitis without obstruction    Rx / DC Orders ED Discharge Orders          Ordered    ursodiol (ACTIGALL) 60 mg/mL  3 times daily        04/25/21 1414             Ernie Avena, MD 04/25/21 1418

## 2021-04-25 NOTE — Discharge Instructions (Addendum)
There is no evidence for acute cholecystitis at this time.  Recommend you continue to follow-up with your general surgeon regarding your symptomatic cholelithiasis. A general surgeon can prescribe Urosodiol and follow-up outpatient to monitor your response to treatment. We can start you on a short course while you follow-up with your general surgeon.  Return for worsening right upper quadrant pain, nausea and vomiting and inability to tolerate oral intake, developing fever and chills.

## 2021-04-26 ENCOUNTER — Other Ambulatory Visit (HOSPITAL_COMMUNITY): Payer: Self-pay

## 2021-04-26 ENCOUNTER — Other Ambulatory Visit (HOSPITAL_BASED_OUTPATIENT_CLINIC_OR_DEPARTMENT_OTHER): Payer: Self-pay

## 2021-04-30 ENCOUNTER — Emergency Department (HOSPITAL_COMMUNITY): Payer: BLUE CROSS/BLUE SHIELD

## 2021-04-30 ENCOUNTER — Encounter (HOSPITAL_COMMUNITY): Payer: Self-pay | Admitting: Emergency Medicine

## 2021-04-30 ENCOUNTER — Emergency Department (HOSPITAL_COMMUNITY)
Admission: EM | Admit: 2021-04-30 | Discharge: 2021-04-30 | Disposition: A | Discharge status: Home / Self Care | Payer: BLUE CROSS/BLUE SHIELD | Attending: Student | Admitting: Student

## 2021-04-30 DIAGNOSIS — Z5321 Procedure and treatment not carried out due to patient leaving prior to being seen by health care provider: Secondary | ICD-10-CM | POA: Diagnosis not present

## 2021-04-30 DIAGNOSIS — J029 Acute pharyngitis, unspecified: Secondary | ICD-10-CM | POA: Diagnosis not present

## 2021-04-30 DIAGNOSIS — R0789 Other chest pain: Secondary | ICD-10-CM | POA: Diagnosis present

## 2021-04-30 LAB — CBC WITH DIFFERENTIAL/PLATELET
Abs Immature Granulocytes: 0.06 10*3/uL (ref 0.00–0.07)
Basophils Absolute: 0 10*3/uL (ref 0.0–0.1)
Basophils Relative: 0 %
Eosinophils Absolute: 0.1 10*3/uL (ref 0.0–0.5)
Eosinophils Relative: 1 %
HCT: 43.9 % (ref 36.0–46.0)
Hemoglobin: 14.9 g/dL (ref 12.0–15.0)
Immature Granulocytes: 0 %
Lymphocytes Relative: 22 %
Lymphs Abs: 3.2 10*3/uL (ref 0.7–4.0)
MCH: 30.7 pg (ref 26.0–34.0)
MCHC: 33.9 g/dL (ref 30.0–36.0)
MCV: 90.3 fL (ref 80.0–100.0)
Monocytes Absolute: 0.7 10*3/uL (ref 0.1–1.0)
Monocytes Relative: 5 %
Neutro Abs: 10.8 10*3/uL — ABNORMAL HIGH (ref 1.7–7.7)
Neutrophils Relative %: 72 %
Platelets: 438 10*3/uL — ABNORMAL HIGH (ref 150–400)
RBC: 4.86 MIL/uL (ref 3.87–5.11)
RDW: 13.1 % (ref 11.5–15.5)
WBC: 14.8 10*3/uL — ABNORMAL HIGH (ref 4.0–10.5)
nRBC: 0 % (ref 0.0–0.2)

## 2021-04-30 LAB — COMPREHENSIVE METABOLIC PANEL
ALT: 22 U/L (ref 0–44)
AST: 16 U/L (ref 15–41)
Albumin: 3.2 g/dL — ABNORMAL LOW (ref 3.5–5.0)
Alkaline Phosphatase: 41 U/L (ref 38–126)
Anion gap: 8 (ref 5–15)
BUN: 10 mg/dL (ref 6–20)
CO2: 25 mmol/L (ref 22–32)
Calcium: 8.6 mg/dL — ABNORMAL LOW (ref 8.9–10.3)
Chloride: 106 mmol/L (ref 98–111)
Creatinine, Ser: 0.81 mg/dL (ref 0.44–1.00)
GFR, Estimated: >60 mL/min (ref >60)
Glucose, Bld: 113 mg/dL — ABNORMAL HIGH (ref 70–99)
Potassium: 3.8 mmol/L (ref 3.5–5.1)
Sodium: 139 mmol/L (ref 135–145)
Total Bilirubin: 0.7 mg/dL (ref 0.3–1.2)
Total Protein: 5.6 g/dL — ABNORMAL LOW (ref 6.5–8.1)

## 2021-04-30 LAB — LIPASE, BLOOD: Lipase: 34 U/L (ref 11–51)

## 2021-04-30 LAB — TROPONIN I (HIGH SENSITIVITY): Troponin I (High Sensitivity): 3 ng/L (ref <18)

## 2021-04-30 NOTE — ED Triage Notes (Signed)
Patient with chest pain that has been going on for months.  Patient still having chest pain after being treated for GERD.  She had esophageal stretching with was supposed to help with the pain, but it continues even after the procedure.

## 2021-04-30 NOTE — ED Provider Notes (Signed)
Emergency Medicine Provider Triage Evaluation Note  Alicia Parks , a 45 y.o. female  was evaluated in triage.  Pt complains of chest pain for about 1 month. Intermittent, does not occur daily, feels like pressure/burning, worse after eating and with certain activities. Sometimes hurts in her abdomen as well. Had her esophagus stretched and thinks this might have made it worse.   Review of Systems  Positive: Chest pain, abdominal pain Negative: Vomiting, fever  Physical Exam  BP 117/76   Pulse 77   Temp 97.9 F (36.6 C) (Oral)   Resp 16   SpO2 100%  Gen:   Awake, no distress   Resp:  Normal effort  MSK:   Moves extremities without difficulty  Other:  No peritoneal signs on abdominal exam.   Medical Decision Making  Medically screening exam initiated at 3:01 AM.  Appropriate orders placed.  Alicia Parks was informed that the remainder of the evaluation will be completed by another provider, this initial triage assessment does not replace that evaluation, and the importance of remaining in the ED until their evaluation is complete.  Chest pain   Cherly Anderson, PA-C 04/30/21 0335    Nira Conn, MD 04/30/21 0345

## 2021-04-30 NOTE — ED Notes (Signed)
Patient left on own accord °

## 2021-04-30 NOTE — ED Triage Notes (Signed)
Patient arrived with EMS from home reports chest tightness onset last month after esophageal stretching procedure . Patient added sore throat this evening .

## 2021-05-22 ENCOUNTER — Other Ambulatory Visit: Payer: Self-pay

## 2021-05-22 ENCOUNTER — Encounter (HOSPITAL_COMMUNITY): Payer: Self-pay | Admitting: Psychiatry

## 2021-05-22 ENCOUNTER — Telehealth (INDEPENDENT_AMBULATORY_CARE_PROVIDER_SITE_OTHER): Payer: BLUE CROSS/BLUE SHIELD | Admitting: Psychiatry

## 2021-05-22 DIAGNOSIS — F411 Generalized anxiety disorder: Secondary | ICD-10-CM

## 2021-05-22 DIAGNOSIS — F3130 Bipolar disorder, current episode depressed, mild or moderate severity, unspecified: Secondary | ICD-10-CM | POA: Diagnosis not present

## 2021-05-22 NOTE — Progress Notes (Signed)
BH MD/PA/NP OP Progress Note Virtual Visit via Video Note  I connected with Alicia Parks on 05/22/21 at  9:30 AM EST by a video enabled telemedicine application and verified that I am speaking with the correct person using two identifiers.  Location: Patient: Home Provider: Clinic   I discussed the limitations of evaluation and management by telemedicine and the availability of in person appointments. The patient expressed understanding and agreed to proceed.  I provided 30 minutes of non-face-to-face time during this encounter.   05/22/2021 9:49 AM Alicia Parks  MRN:  [id number]  Chief Complaint: "I got dysphagia and needed my esophagus stretched so I stopped all of my medications."  HPI: 45 year old female seen today for follow up psychiatric evaluation. She has a psychiatric history of depression, bipolar disorder, anxiety, and PTSD. She is currently managed on hydroxyzine 25 mg TID, BuSpar 10 mg 3 times daily, and Latuda 20 mg daily.  She notes that she discontinued her medications due to side effects.  Today she was well-groomed, pleasant, cooperative, and engaged in conversation.  She informed Clinical research associate that after starting [place] she developed dysphagia and had difficulty swallowing.  She informed Clinical research associate that she had her esophagus stretched at [facility] on October 14.  She informed Clinical research associate however she had discontinued Latuda in September due to dysphagia.  She notes that since her surgery she has had other complications.  She informed Clinical research associate that she will be getting her gallbladder removed later this month.  Patient informed writer that she has pain in her gallbladder.  She quantifies her pain as 8 out of 10.  She informed Clinical research associate that she is taking Pepcid to help manage pain however notes that it is somewhat effective.    Patient notes that since being unmedicated she is more irritable, distractible, and having racing thoughts.  She also notes that she has been more anxious  and depressed however relates these symptoms to deaths in her family.  She informed Clinical research associate that her cousin passed away of cancer recently.  She also notes that her husband had two death in his family.  Other stressors include finances.  Patient notes that she is unable to work due to her medical conditions.  She notes that she and her husband are now living in a motel and struggles to pay the hotel fees weekly.  Today provider conducted a GAD-7 and patient scored a 20, at her last visit she scored a 21.  Provider also conducted PHQ-9 and patient scored a 21, at her last visit she scored a 23.  She notes that her sleep is poor noting that she sleeps 4 to 5 hours nightly but equates it to her dysphagia.  She notes that her appetite has reduced but denies weight gain/loss.  Today she endorses passive SI however notes that she would not harm herself.  She denies SI/HI/VAH or paranoia.    At this time patient notes that she does not want to restart medications until after she has her surgery.  Provider endorsed understanding and agreed.  Provider instructed patient to walk into the clinic if needed on Mondays and Tuesdays between the hours of 8 AM and 11, call the clinic if she has questions or concerns, or follow-up at her next scheduled appointment.   No other concerns at this time.     Visit Diagnosis:    ICD-10-CM   1. Bipolar I disorder, most recent episode depressed (H[facility])  F31.30     2.  Generalized anxiety disorder  F41.1        Past Psychiatric History: depression, bipolar disorder, anxiety, and PTSD  Past Medical History:  Past Medical History:  Diagnosis Date   Back pain    Complication of anesthesia    Ear drum perforation    Fibromyalgia    GERD (gastroesophageal reflux disease)    Migraine    Restless leg syndrome     Past Surgical History:  Procedure Laterality Date   TONSILLECTOMY     TUBAL LIGATION      Family Psychiatric History: PTSD, anxiety, Bipolar, and  depression  Family History:  Family History  Problem Relation Age of Onset   Hypertension Father    Heart disease Father    Gout Father    Osteoporosis Mother    Cancer Other     Social History:  Social History   Socioeconomic History   Marital status: Married    Spouse name: Not on file   Number of children: Not on file   Years of education: Not on file   Highest education level: Not on file  Occupational History   Not on file  Tobacco Use   Smoking status: Never   Smokeless tobacco: Never  Substance and Sexual Activity   Alcohol use: No   Drug use: Yes    Types: Marijuana   Sexual activity: Yes    Birth control/protection: Surgical  Other Topics Concern   Not on file  Social History Narrative   Not on file   Social Determinants of Health   Financial Resource Strain: Not on file  Food Insecurity: Not on file  Transportation Needs: Not on file  Physical Activity: Not on file  Stress: Not on file  Social Connections: Not on file    Allergies:  Allergies  Allergen Reactions   Aspirin Other (See Comments)    "gives me the jitters"   Compazine [Prochlorperazine Edisylate] Other (See Comments)    Makes body move funny. Shaking   Effexor [Venlafaxine]     Nightmares and night sweats   Tylenol [Acetaminophen] Nausea Only and Rash    Metabolic Disorder Labs: Lab Results  Component Value Date   HGBA1C 5.1 11/23/2017   MPG 100 10/15/2016   No results found for: PROLACTIN Lab Results  Component Value Date   CHOL 211 (H) 11/23/2017   TRIG 226 (H) 11/23/2017   HDL 43 11/23/2017   CHOLHDL 4.9 (H) 11/23/2017   VLDL 22 10/15/2016   LDLCALC 123 (H) 11/23/2017   LDLCALC 124 (H) 10/15/2016   Lab Results  Component Value Date   TSH 3.200 11/23/2017   TSH 1.56 10/15/2016    Therapeutic Level Labs: No results found for: LITHIUM No results found for: VALPROATE No components found for:  CBMZ  Current Medications: Current Outpatient Medications   Medication Sig Dispense Refill   diphenhydrAMINE (SOMINEX) 25 MG tablet Take 25 mg by mouth daily as needed for allergies.     esomeprazole (NEXIUM) 20 MG packet Mix 1 packet (20 mg) with water and drink by mouth in the morning and at bedtime. 60 each 0   famotidine (PEPCID) 40 MG/5ML suspension Take 2.5 mLs (20 mg total) by mouth 2 (two) times daily. 100 mL 0   hydrOXYzine (ATARAX) 10 MG/5ML syrup Take 12.5 mLs (25 mg total) by mouth 3 (three) times daily as needed for anxiety. (Patient not taking: Reported on 04/25/2021) 240 mL 0   hydrOXYzine (ATARAX/VISTARIL) 25 MG tablet TAKE 1 TABLET (25 MG  TOTAL) BY MOUTH 4 (FOUR) TIMES DAILY AS NEEDED. (Patient taking differently: Take 25 mg by mouth every 6 (six) hours as needed for itching or anxiety.) 90 tablet 3   lidocaine (XYLOCAINE) 2 % solution Use as directed 15 mLs in the mouth or throat every 6 (six) hours as needed for mouth pain. 100 mL 0   sucralfate (CARAFATE) 1 GM/10ML suspension Take 10 mLs (1 g total) by mouth 4 (four) times daily -  with meals and at bedtime. (Patient not taking: Reported on 04/25/2021) 420 mL 0   ursodiol (ACTIGALL) 300 MG capsule Take 1 capsule by mouth three times daily for 30 days. 90 capsule 0   ursodiol (ACTIGALL) 60 mg/mL Take 4.8 mLs (288 mg total) by mouth 3 (three) times daily. 432 mL 0   vitamin B-12 (CYANOCOBALAMIN) 1000 MCG tablet Take 1,000 mcg by mouth daily.     No current facility-administered medications for this visit.     Musculoskeletal: Strength & Muscle Tone:  Unable to assess due to [place]:  Unable to assess due to telehealth Patient leans: N/A  Psychiatric Specialty Exam: Review of Systems  There were no vitals taken for this visit.There is no height or weight on file to calculate BMI.  General Appearance: Well Groomed  Eye Contact:  Good  Speech:  Clear and Coherent and Normal Rate  Volume:  Normal  Mood:  Anxious and Depressed  Affect:  Appropriate and Congruent   Thought Process:  Coherent, Goal Directed, and Linear  Orientation:  Full (Time, Place, and Person)  Thought Content: WDL and Logical   Suicidal Thoughts:  No  Homicidal Thoughts:  No  Memory:  Immediate;   Good Recent;   Good Remote;   Good  Judgement:  Good  Insight:  Good  Psychomotor Activity:  Normal  Concentration:  Concentration: Good and Attention Span: Good  Recall:  Good  Fund of Knowledge: Good  Language: Good  Akathisia:  No  Handed:  Right  AIMS (if indicated): not done  Assets:  Communication Skills Desire for Improvement Financial Resources/Insurance Housing Leisure Time  ADL's:  Intact  Cognition: WNL  Sleep:  Fair   Screenings: GAD-7    Flowsheet Row Video Visit from 05/22/2021 in [facility] Video Visit from 02/13/2021 in [place] from 10/04/2020 in [place] Video Visit from 04/25/2020 in [facility]  Total GAD-7 Score 20 21 13 20       PHQ2-9    Flowsheet Row Video Visit from 05/22/2021 in [facility] Video Visit from 02/13/2021 in [place] from 10/04/2020 in [place] Video Visit from 04/25/2020 in [facility] Office Visit from 11/23/2017 in [place]  PHQ-2 Total Score 6 6 3 6 1   PHQ-9 Total Score 21 23 12 20  --      Flowsheet Row Video Visit from 05/22/2021 in [facility] ED from 04/30/2021 in [facility] from 04/25/2021 in [facility]  C-SSRS RISK CATEGORY Error: Q7 should not be populated when Q6 is No No Risk No Risk        Assessment and Plan: Patient notes she has had increased anxiety, depression, insomnia, and symptoms of hypomania since discontinuing her  medications due to [place] potentially causing dysphagia.  At this time  patient notes that she does not want to restart medications until after she has her surgery.  Provider endorsed understanding and agreed.  Provider instructed patient to walk into the clinic if needed on Mondays and Tuesdays between the hours of 8 AM and 11, call the clinic if she has questions or concerns, or follow-up at her next scheduled appointment.  1. Generalized anxiety disorder   2. Bipolar I disorder, most recent episode depressed ([place])   Follow-up in 3 months  Salley Slaughter, NP 05/22/2021, 9:49 AM

## 2021-05-29 ENCOUNTER — Emergency Department (HOSPITAL_COMMUNITY)
Admission: EM | Admit: 2021-05-29 | Discharge: 2021-05-29 | Disposition: A | Discharge status: Home / Self Care | Payer: BLUE CROSS/BLUE SHIELD | Attending: Emergency Medicine | Admitting: Emergency Medicine

## 2021-05-29 ENCOUNTER — Other Ambulatory Visit: Payer: Self-pay

## 2021-05-29 ENCOUNTER — Emergency Department (HOSPITAL_COMMUNITY): Payer: BLUE CROSS/BLUE SHIELD

## 2021-05-29 ENCOUNTER — Encounter (HOSPITAL_COMMUNITY): Payer: Self-pay

## 2021-05-29 DIAGNOSIS — R52 Pain, unspecified: Secondary | ICD-10-CM

## 2021-05-29 DIAGNOSIS — Z5321 Procedure and treatment not carried out due to patient leaving prior to being seen by health care provider: Secondary | ICD-10-CM | POA: Diagnosis not present

## 2021-05-29 DIAGNOSIS — R1011 Right upper quadrant pain: Secondary | ICD-10-CM | POA: Diagnosis present

## 2021-05-29 LAB — CBC WITH DIFFERENTIAL/PLATELET
Abs Immature Granulocytes: 0.05 10*3/uL (ref 0.00–0.07)
Basophils Absolute: 0 10*3/uL (ref 0.0–0.1)
Basophils Relative: 0 %
Eosinophils Absolute: 0.2 10*3/uL (ref 0.0–0.5)
Eosinophils Relative: 1 %
HCT: 41.8 % (ref 36.0–46.0)
Hemoglobin: 14.1 g/dL (ref 12.0–15.0)
Immature Granulocytes: 0 %
Lymphocytes Relative: 33 %
Lymphs Abs: 5.1 10*3/uL — ABNORMAL HIGH (ref 0.7–4.0)
MCH: 30.7 pg (ref 26.0–34.0)
MCHC: 33.7 g/dL (ref 30.0–36.0)
MCV: 91.1 fL (ref 80.0–100.0)
Monocytes Absolute: 0.8 10*3/uL (ref 0.1–1.0)
Monocytes Relative: 5 %
Neutro Abs: 9.3 10*3/uL — ABNORMAL HIGH (ref 1.7–7.7)
Neutrophils Relative %: 61 %
Platelets: 392 10*3/uL (ref 150–400)
RBC: 4.59 MIL/uL (ref 3.87–5.11)
RDW: 13 % (ref 11.5–15.5)
WBC: 15.5 10*3/uL — ABNORMAL HIGH (ref 4.0–10.5)
nRBC: 0 % (ref 0.0–0.2)

## 2021-05-29 LAB — I-STAT BETA HCG BLOOD, ED (MC, WL, AP ONLY): I-stat hCG, quantitative: <5 m[IU]/mL (ref <5)

## 2021-05-29 LAB — COMPREHENSIVE METABOLIC PANEL
ALT: 32 U/L (ref 0–44)
AST: 44 U/L — ABNORMAL HIGH (ref 15–41)
Albumin: 3.7 g/dL (ref 3.5–5.0)
Alkaline Phosphatase: 47 U/L (ref 38–126)
Anion gap: 7 (ref 5–15)
BUN: 13 mg/dL (ref 6–20)
CO2: 27 mmol/L (ref 22–32)
Calcium: 8.9 mg/dL (ref 8.9–10.3)
Chloride: 106 mmol/L (ref 98–111)
Creatinine, Ser: 0.8 mg/dL (ref 0.44–1.00)
GFR, Estimated: >60 mL/min (ref >60)
Glucose, Bld: 125 mg/dL — ABNORMAL HIGH (ref 70–99)
Potassium: 4.3 mmol/L (ref 3.5–5.1)
Sodium: 140 mmol/L (ref 135–145)
Total Bilirubin: 0.6 mg/dL (ref 0.3–1.2)
Total Protein: 6.2 g/dL — ABNORMAL LOW (ref 6.5–8.1)

## 2021-05-29 LAB — LIPASE, BLOOD: Lipase: 45 U/L (ref 11–51)

## 2021-05-29 NOTE — ED Triage Notes (Signed)
Pt reports with pain to her RUQ x 2 days. Pt states that she is to have her gallbladder removed in January.

## 2021-05-29 NOTE — ED Provider Notes (Signed)
Emergency Medicine Provider Triage Evaluation Note  Alicia Parks , a 45 y.o. female  was evaluated in triage.  Pt complains of abd pain. Hx gallstones and scheduled for cholecystectomy but pain worse today.   Review of Systems  Positive: Abd pain, nausea Negative: Fever, vomiting  Physical Exam  BP 119/87 (BP Location: Left Arm)   Pulse 72   Temp (!) 97.5 F (36.4 C) (Oral)   Resp 16   SpO2 98%  Gen:   Awake, no distress   Resp:  Normal effort  MSK:   Moves extremities without difficulty  Other:  Ruq ttp with guarding  Medical Decision Making  Medically screening exam initiated at 9:23 PM.  Appropriate orders placed.  JEM CASTRO was informed that the remainder of the evaluation will be completed by another provider, this initial triage assessment does not replace that evaluation, and the importance of remaining in the ED until their evaluation is complete.     Karrie Meres, PA-C 05/29/21 2124    Rozelle Logan, DO 05/29/21 2332

## 2021-05-30 ENCOUNTER — Encounter (HOSPITAL_COMMUNITY)
Admission: EM | Disposition: A | Discharge status: Home / Self Care | Payer: Self-pay | Source: Home / Self Care | Attending: Emergency Medicine

## 2021-05-30 ENCOUNTER — Inpatient Hospital Stay (HOSPITAL_COMMUNITY): Payer: BLUE CROSS/BLUE SHIELD | Admitting: Certified Registered Nurse Anesthetist

## 2021-05-30 ENCOUNTER — Observation Stay (HOSPITAL_BASED_OUTPATIENT_CLINIC_OR_DEPARTMENT_OTHER)
Admission: EM | Admit: 2021-05-30 | Discharge: 2021-05-30 | Disposition: A | Discharge status: Home / Self Care | Payer: BLUE CROSS/BLUE SHIELD | Attending: General Surgery | Admitting: General Surgery

## 2021-05-30 ENCOUNTER — Other Ambulatory Visit: Payer: Self-pay

## 2021-05-30 ENCOUNTER — Inpatient Hospital Stay (HOSPITAL_COMMUNITY): Payer: BLUE CROSS/BLUE SHIELD

## 2021-05-30 ENCOUNTER — Encounter (HOSPITAL_BASED_OUTPATIENT_CLINIC_OR_DEPARTMENT_OTHER): Payer: Self-pay

## 2021-05-30 DIAGNOSIS — M797 Fibromyalgia: Secondary | ICD-10-CM | POA: Diagnosis not present

## 2021-05-30 DIAGNOSIS — K810 Acute cholecystitis: Secondary | ICD-10-CM

## 2021-05-30 DIAGNOSIS — K8020 Calculus of gallbladder without cholecystitis without obstruction: Secondary | ICD-10-CM

## 2021-05-30 DIAGNOSIS — Z20822 Contact with and (suspected) exposure to covid-19: Secondary | ICD-10-CM | POA: Insufficient documentation

## 2021-05-30 DIAGNOSIS — K824 Cholesterolosis of gallbladder: Secondary | ICD-10-CM | POA: Diagnosis not present

## 2021-05-30 DIAGNOSIS — G43909 Migraine, unspecified, not intractable, without status migrainosus: Secondary | ICD-10-CM | POA: Insufficient documentation

## 2021-05-30 DIAGNOSIS — K8010 Calculus of gallbladder with chronic cholecystitis without obstruction: Principal | ICD-10-CM | POA: Insufficient documentation

## 2021-05-30 DIAGNOSIS — K819 Cholecystitis, unspecified: Secondary | ICD-10-CM | POA: Diagnosis present

## 2021-05-30 DIAGNOSIS — K219 Gastro-esophageal reflux disease without esophagitis: Secondary | ICD-10-CM | POA: Insufficient documentation

## 2021-05-30 DIAGNOSIS — Z419 Encounter for procedure for purposes other than remedying health state, unspecified: Secondary | ICD-10-CM

## 2021-05-30 DIAGNOSIS — R1084 Generalized abdominal pain: Secondary | ICD-10-CM | POA: Diagnosis present

## 2021-05-30 HISTORY — PX: CHOLECYSTECTOMY: SHX55

## 2021-05-30 LAB — COMPREHENSIVE METABOLIC PANEL
ALT: 111 U/L — ABNORMAL HIGH (ref 0–44)
ALT: 108 U/L — ABNORMAL HIGH (ref 0–44)
AST: 215 U/L — ABNORMAL HIGH (ref 15–41)
AST: 146 U/L — ABNORMAL HIGH (ref 15–41)
Albumin: 3.8 g/dL (ref 3.5–5.0)
Albumin: 2.9 g/dL — ABNORMAL LOW (ref 3.5–5.0)
Alkaline Phosphatase: 69 U/L (ref 38–126)
Alkaline Phosphatase: 69 U/L (ref 38–126)
Anion gap: 8 (ref 5–15)
Anion gap: 8 (ref 5–15)
BUN: 10 mg/dL (ref 6–20)
BUN: 8 mg/dL (ref 6–20)
CO2: 27 mmol/L (ref 22–32)
CO2: 24 mmol/L (ref 22–32)
Calcium: 8.8 mg/dL — ABNORMAL LOW (ref 8.9–10.3)
Calcium: 8.4 mg/dL — ABNORMAL LOW (ref 8.9–10.3)
Chloride: 105 mmol/L (ref 98–111)
Chloride: 107 mmol/L (ref 98–111)
Creatinine, Ser: 0.74 mg/dL (ref 0.44–1.00)
Creatinine, Ser: 0.68 mg/dL (ref 0.44–1.00)
GFR, Estimated: >60 mL/min (ref >60)
GFR, Estimated: >60 mL/min (ref >60)
Glucose, Bld: 108 mg/dL — ABNORMAL HIGH (ref 70–99)
Glucose, Bld: 88 mg/dL (ref 70–99)
Potassium: 3.7 mmol/L (ref 3.5–5.1)
Potassium: 4 mmol/L (ref 3.5–5.1)
Sodium: 140 mmol/L (ref 135–145)
Sodium: 139 mmol/L (ref 135–145)
Total Bilirubin: 0.6 mg/dL (ref 0.3–1.2)
Total Bilirubin: 0.5 mg/dL (ref 0.3–1.2)
Total Protein: 6 g/dL — ABNORMAL LOW (ref 6.5–8.1)
Total Protein: 4.8 g/dL — ABNORMAL LOW (ref 6.5–8.1)

## 2021-05-30 LAB — CBC
HCT: 41.3 % (ref 36.0–46.0)
Hemoglobin: 13.7 g/dL (ref 12.0–15.0)
MCH: 29.9 pg (ref 26.0–34.0)
MCHC: 33.2 g/dL (ref 30.0–36.0)
MCV: 90.2 fL (ref 80.0–100.0)
Platelets: 402 10*3/uL — ABNORMAL HIGH (ref 150–400)
RBC: 4.58 MIL/uL (ref 3.87–5.11)
RDW: 13 % (ref 11.5–15.5)
WBC: 16.7 10*3/uL — ABNORMAL HIGH (ref 4.0–10.5)
nRBC: 0 % (ref 0.0–0.2)

## 2021-05-30 LAB — RESP PANEL BY RT-PCR (FLU A&B, COVID) ARPGX2
Influenza A by PCR: NEGATIVE
Influenza B by PCR: NEGATIVE
SARS Coronavirus 2 by RT PCR: NEGATIVE

## 2021-05-30 LAB — LIPASE, BLOOD: Lipase: 44 U/L (ref 11–51)

## 2021-05-30 LAB — HIV ANTIBODY (ROUTINE TESTING W REFLEX): HIV Screen 4th Generation wRfx: NONREACTIVE

## 2021-05-30 SURGERY — LAPAROSCOPIC CHOLECYSTECTOMY WITH INTRAOPERATIVE CHOLANGIOGRAM
Anesthesia: General

## 2021-05-30 MED ORDER — PIPERACILLIN-TAZOBACTAM 3.375 G IVPB 30 MIN
3.3750 g | Freq: Once | INTRAVENOUS | Status: AC
  Administered 2021-05-30: 3.375 g via INTRAVENOUS

## 2021-05-30 MED ORDER — DEXAMETHASONE SODIUM PHOSPHATE 10 MG/ML IJ SOLN
INTRAMUSCULAR | Status: DC | PRN
  Administered 2021-05-30: 5 mg via INTRAVENOUS

## 2021-05-30 MED ORDER — ROCURONIUM BROMIDE 10 MG/ML (PF) SYRINGE
PREFILLED_SYRINGE | INTRAVENOUS | Status: AC
  Filled 2021-05-30: qty 10

## 2021-05-30 MED ORDER — GABAPENTIN 300 MG PO CAPS: 300.0000 mg | ORAL_CAPSULE | ORAL | Status: AC

## 2021-05-30 MED ORDER — INDIGOTINDISULFONATE SODIUM 8 MG/ML IJ SOLN: INTRAMUSCULAR | Status: DC | PRN

## 2021-05-30 MED ORDER — DIPHENHYDRAMINE HCL 12.5 MG/5ML PO ELIX: 12.5000 mg | ORAL_SOLUTION | Freq: Four times a day (QID) | ORAL | Status: DC | PRN

## 2021-05-30 MED ORDER — CHLORHEXIDINE GLUCONATE 0.12 % MT SOLN: 15.0000 mL | Freq: Once | OROMUCOSAL | Status: AC

## 2021-05-30 MED ORDER — PROPOFOL 10 MG/ML IV BOLUS
INTRAVENOUS | Status: DC | PRN
  Administered 2021-05-30: 50 mg via INTRAVENOUS
  Administered 2021-05-30: 15 mg via INTRAVENOUS

## 2021-05-30 MED ORDER — OXYCODONE HCL 5 MG PO TABS: 5.0000 mg | ORAL_TABLET | ORAL | 0 refills | Status: DC | PRN

## 2021-05-30 MED ORDER — ONDANSETRON HCL 4 MG/2ML IJ SOLN: 4.0000 mg | Freq: Four times a day (QID) | INTRAMUSCULAR | Status: DC | PRN

## 2021-05-30 MED ORDER — DEXAMETHASONE SODIUM PHOSPHATE 10 MG/ML IJ SOLN
INTRAMUSCULAR | Status: AC
  Filled 2021-05-30: qty 1

## 2021-05-30 MED ORDER — ACETAMINOPHEN 500 MG PO TABS
1000.0000 mg | ORAL_TABLET | Freq: Four times a day (QID) | ORAL | Status: DC
  Filled 2021-05-30 (×2): qty 2

## 2021-05-30 MED ORDER — MIDAZOLAM HCL 5 MG/5ML IJ SOLN
INTRAMUSCULAR | Status: DC | PRN
  Administered 2021-05-30: 2 mg via INTRAVENOUS

## 2021-05-30 MED ORDER — ONDANSETRON HCL 4 MG/2ML IJ SOLN
INTRAMUSCULAR | Status: DC | PRN
  Administered 2021-05-30: 4 mg via INTRAVENOUS

## 2021-05-30 MED ORDER — BUPIVACAINE HCL (PF) 0.25 % IJ SOLN
INTRAMUSCULAR | Status: DC | PRN
  Administered 2021-05-30 (×2): 20 mL via PERINEURAL

## 2021-05-30 MED ORDER — PROPOFOL 10 MG/ML IV BOLUS
INTRAVENOUS | Status: AC
  Filled 2021-05-30: qty 20

## 2021-05-30 MED ORDER — FENTANYL CITRATE (PF) 100 MCG/2ML IJ SOLN: 100.0000 ug | Freq: Once | INTRAMUSCULAR | Status: AC

## 2021-05-30 MED ORDER — MIDAZOLAM HCL 2 MG/2ML IJ SOLN
INTRAMUSCULAR | Status: AC
  Administered 2021-05-30: 2 mg via INTRAVENOUS
  Filled 2021-05-30: qty 2

## 2021-05-30 MED ORDER — SODIUM CHLORIDE 0.9% FLUSH: 3.0000 mL | INTRAVENOUS | Status: DC | PRN

## 2021-05-30 MED ORDER — BUPIVACAINE-EPINEPHRINE 0.25% -1:200000 IJ SOLN
INTRAMUSCULAR | Status: DC | PRN
  Administered 2021-05-30: 8 mL

## 2021-05-30 MED ORDER — PIPERACILLIN-TAZOBACTAM 3.375 G IVPB
3.3750 g | Freq: Three times a day (TID) | INTRAVENOUS | Status: DC
  Filled 2021-05-30: qty 50

## 2021-05-30 MED ORDER — OXYCODONE HCL 5 MG PO TABS
5.0000 mg | ORAL_TABLET | ORAL | 0 refills | Status: DC | PRN
  Filled 2021-05-30: qty 10, 2d supply, fill #0

## 2021-05-30 MED ORDER — OXYCODONE HCL 5 MG PO TABS: 5.0000 mg | ORAL_TABLET | ORAL | Status: DC | PRN

## 2021-05-30 MED ORDER — OXYCODONE HCL 5 MG PO TABS: 5.0000 mg | ORAL_TABLET | Freq: Once | ORAL | Status: DC | PRN

## 2021-05-30 MED ORDER — HYDRALAZINE HCL 20 MG/ML IJ SOLN: 10.0000 mg | INTRAMUSCULAR | Status: DC | PRN

## 2021-05-30 MED ORDER — LIDOCAINE 2% (20 MG/ML) 5 ML SYRINGE
INTRAMUSCULAR | Status: DC | PRN
  Administered 2021-05-30: 50 mg via INTRAVENOUS

## 2021-05-30 MED ORDER — MIDAZOLAM HCL 2 MG/2ML IJ SOLN
INTRAMUSCULAR | Status: AC
  Filled 2021-05-30: qty 2

## 2021-05-30 MED ORDER — LIDOCAINE 2% (20 MG/ML) 5 ML SYRINGE
INTRAMUSCULAR | Status: AC
  Filled 2021-05-30: qty 5

## 2021-05-30 MED ORDER — DIPHENHYDRAMINE HCL 50 MG/ML IJ SOLN: 12.5000 mg | Freq: Four times a day (QID) | INTRAMUSCULAR | Status: DC | PRN

## 2021-05-30 MED ORDER — SODIUM CHLORIDE 0.9 % IV SOLN: 250.0000 mL | INTRAVENOUS | Status: DC | PRN

## 2021-05-30 MED ORDER — ACETAMINOPHEN 500 MG PO TABS: 1000.0000 mg | ORAL_TABLET | ORAL | Status: DC

## 2021-05-30 MED ORDER — OXYCODONE HCL 5 MG/5ML PO SOLN: 5.0000 mg | Freq: Once | ORAL | Status: DC | PRN

## 2021-05-30 MED ORDER — CEFAZOLIN SODIUM-DEXTROSE 2-4 GM/100ML-% IV SOLN
INTRAVENOUS | Status: AC
  Filled 2021-05-30: qty 100

## 2021-05-30 MED ORDER — ROCURONIUM BROMIDE 10 MG/ML (PF) SYRINGE
PREFILLED_SYRINGE | INTRAVENOUS | Status: DC | PRN
  Administered 2021-05-30: 60 mg via INTRAVENOUS

## 2021-05-30 MED ORDER — FENTANYL CITRATE (PF) 250 MCG/5ML IJ SOLN
INTRAMUSCULAR | Status: AC
  Filled 2021-05-30: qty 5

## 2021-05-30 MED ORDER — 0.9 % SODIUM CHLORIDE (POUR BTL) OPTIME
TOPICAL | Status: DC | PRN
  Administered 2021-05-30: 1000 mL

## 2021-05-30 MED ORDER — INDOCYANINE GREEN 25 MG IV SOLR
1.0000 mg | Freq: Once | INTRAVENOUS | Status: DC
  Filled 2021-05-30: qty 10

## 2021-05-30 MED ORDER — SODIUM CHLORIDE 0.9 % IR SOLN
Status: DC | PRN
  Administered 2021-05-30: 1000 mL

## 2021-05-30 MED ORDER — ONDANSETRON HCL 4 MG/2ML IJ SOLN
INTRAMUSCULAR | Status: AC
  Filled 2021-05-30: qty 2

## 2021-05-30 MED ORDER — DEXMEDETOMIDINE (PRECEDEX) IN NS 20 MCG/5ML (4 MCG/ML) IV SYRINGE
PREFILLED_SYRINGE | INTRAVENOUS | Status: AC
  Filled 2021-05-30: qty 5

## 2021-05-30 MED ORDER — CHLORHEXIDINE GLUCONATE 0.12 % MT SOLN
OROMUCOSAL | Status: AC
  Administered 2021-05-30: 15 mL via OROMUCOSAL
  Filled 2021-05-30: qty 15

## 2021-05-30 MED ORDER — DEXMEDETOMIDINE (PRECEDEX) IN NS 20 MCG/5ML (4 MCG/ML) IV SYRINGE
PREFILLED_SYRINGE | INTRAVENOUS | Status: DC | PRN
  Administered 2021-05-30 (×2): 8 ug via INTRAVENOUS
  Administered 2021-05-30: 4 ug via INTRAVENOUS

## 2021-05-30 MED ORDER — DOCUSATE SODIUM 100 MG PO CAPS: 200.0000 mg | ORAL_CAPSULE | Freq: Two times a day (BID) | ORAL | Status: DC

## 2021-05-30 MED ORDER — SODIUM CHLORIDE 0.9 % IV SOLN: INTRAVENOUS | Status: AC

## 2021-05-30 MED ORDER — BUPIVACAINE-EPINEPHRINE (PF) 0.25% -1:200000 IJ SOLN
INTRAMUSCULAR | Status: AC
  Filled 2021-05-30: qty 30

## 2021-05-30 MED ORDER — SUGAMMADEX SODIUM 200 MG/2ML IV SOLN
INTRAVENOUS | Status: DC | PRN
  Administered 2021-05-30: 200 mg via INTRAVENOUS

## 2021-05-30 MED ORDER — ONDANSETRON 4 MG PO TBDP: 4.0000 mg | ORAL_TABLET | Freq: Four times a day (QID) | ORAL | Status: DC | PRN

## 2021-05-30 MED ORDER — TRAMADOL HCL 50 MG PO TABS: 50.0000 mg | ORAL_TABLET | Freq: Four times a day (QID) | ORAL | Status: DC | PRN

## 2021-05-30 MED ORDER — CEFAZOLIN SODIUM-DEXTROSE 2-3 GM-%(50ML) IV SOLR
INTRAVENOUS | Status: DC | PRN
  Administered 2021-05-30: 2 g via INTRAVENOUS

## 2021-05-30 MED ORDER — MIDAZOLAM HCL 2 MG/2ML IJ SOLN: 2.0000 mg | Freq: Once | INTRAMUSCULAR | Status: AC

## 2021-05-30 MED ORDER — ACETAMINOPHEN 650 MG RE SUPP: 650.0000 mg | RECTAL | Status: DC | PRN

## 2021-05-30 MED ORDER — GABAPENTIN 300 MG PO CAPS
ORAL_CAPSULE | ORAL | Status: AC
  Administered 2021-05-30: 300 mg via ORAL
  Filled 2021-05-30: qty 1

## 2021-05-30 MED ORDER — PIPERACILLIN-TAZOBACTAM 3.375 G IVPB: 3.3750 g | Freq: Three times a day (TID) | INTRAVENOUS | Status: DC

## 2021-05-30 MED ORDER — HYDROMORPHONE HCL 1 MG/ML IJ SOLN
0.5000 mg | INTRAMUSCULAR | Status: DC | PRN
  Administered 2021-05-30: 0.5 mg via INTRAVENOUS
  Filled 2021-05-30: qty 1

## 2021-05-30 MED ORDER — HEPARIN SODIUM (PORCINE) 5000 UNIT/ML IJ SOLN
5000.0000 [IU] | Freq: Three times a day (TID) | INTRAMUSCULAR | Status: DC
  Administered 2021-05-30: 5000 [IU] via SUBCUTANEOUS
  Filled 2021-05-30: qty 1

## 2021-05-30 MED ORDER — SODIUM CHLORIDE 0.9% FLUSH: 3.0000 mL | Freq: Two times a day (BID) | INTRAVENOUS | Status: DC

## 2021-05-30 MED ORDER — LACTATED RINGERS IV SOLN: INTRAVENOUS | Status: DC

## 2021-05-30 MED ORDER — CEFAZOLIN SODIUM-DEXTROSE 2-4 GM/100ML-% IV SOLN: 2.0000 g | INTRAVENOUS | Status: DC

## 2021-05-30 MED ORDER — FENTANYL CITRATE (PF) 100 MCG/2ML IJ SOLN: 25.0000 ug | INTRAMUSCULAR | Status: DC | PRN

## 2021-05-30 MED ORDER — SIMETHICONE 80 MG PO CHEW: 40.0000 mg | CHEWABLE_TABLET | Freq: Four times a day (QID) | ORAL | Status: DC | PRN

## 2021-05-30 MED ORDER — FENTANYL CITRATE (PF) 100 MCG/2ML IJ SOLN
INTRAMUSCULAR | Status: AC
  Administered 2021-05-30: 100 ug via INTRAVENOUS
  Filled 2021-05-30: qty 2

## 2021-05-30 MED ORDER — FENTANYL CITRATE (PF) 250 MCG/5ML IJ SOLN
INTRAMUSCULAR | Status: DC | PRN
  Administered 2021-05-30 (×7): 50 ug via INTRAVENOUS

## 2021-05-30 MED ORDER — IBUPROFEN 400 MG PO TABS: 600.0000 mg | ORAL_TABLET | Freq: Four times a day (QID) | ORAL | Status: DC | PRN

## 2021-05-30 MED ORDER — ACETAMINOPHEN 325 MG PO TABS: 650.0000 mg | ORAL_TABLET | ORAL | Status: DC | PRN

## 2021-05-30 MED ORDER — ORAL CARE MOUTH RINSE: 15.0000 mL | Freq: Once | OROMUCOSAL | Status: AC

## 2021-05-30 SURGICAL SUPPLY — 41 items
ADH SKN CLS APL DERMABOND .7 (GAUZE/BANDAGES/DRESSINGS) ×1
APL PRP STRL LF DISP 70% ISPRP (MISCELLANEOUS) ×1
APPLIER CLIP 5 13 M/L LIGAMAX5 (MISCELLANEOUS) ×2
APR CLP MED LRG 5 ANG JAW (MISCELLANEOUS) ×1
BAG COUNTER SPONGE SURGICOUNT (BAG) ×2 IMPLANT
BAG SPEC RTRVL 10 TROC 200 (ENDOMECHANICALS) ×1
BAG SPNG CNTER NS LX DISP (BAG) ×1
CANISTER SUCT 3000ML PPV (MISCELLANEOUS) ×2 IMPLANT
CHLORAPREP W/TINT 26 (MISCELLANEOUS) ×2 IMPLANT
CLIP APPLIE 5 13 M/L LIGAMAX5 (MISCELLANEOUS) ×1 IMPLANT
COVER SURGICAL LIGHT HANDLE (MISCELLANEOUS) ×2 IMPLANT
DERMABOND ADVANCED (GAUZE/BANDAGES/DRESSINGS) ×1
DERMABOND ADVANCED .7 DNX12 (GAUZE/BANDAGES/DRESSINGS) ×1 IMPLANT
ELECT REM PT RETURN 9FT ADLT (ELECTROSURGICAL) ×2
ELECTRODE REM PT RTRN 9FT ADLT (ELECTROSURGICAL) ×1 IMPLANT
GLOVE SURG ENC MOIS LTX SZ7 (GLOVE) ×2 IMPLANT
GLOVE SURG UNDER POLY LF SZ7.5 (GLOVE) ×2 IMPLANT
GOWN STRL REUS W/ TWL LRG LVL3 (GOWN DISPOSABLE) ×3 IMPLANT
GOWN STRL REUS W/TWL LRG LVL3 (GOWN DISPOSABLE) ×6
GRASPER SUT TROCAR 14GX15 (MISCELLANEOUS) ×2 IMPLANT
KIT BASIN OR (CUSTOM PROCEDURE TRAY) ×2 IMPLANT
KIT TURNOVER KIT B (KITS) ×2 IMPLANT
NS IRRIG 1000ML POUR BTL (IV SOLUTION) ×2 IMPLANT
PAD ARMBOARD 7.5X6 YLW CONV (MISCELLANEOUS) ×2 IMPLANT
POUCH RETRIEVAL ECOSAC 10 (ENDOMECHANICALS) ×1 IMPLANT
POUCH RETRIEVAL ECOSAC 10MM (ENDOMECHANICALS) ×2
SCISSORS LAP 5X35 DISP (ENDOMECHANICALS) ×2 IMPLANT
SET IRRIG TUBING LAPAROSCOPIC (IRRIGATION / IRRIGATOR) ×2 IMPLANT
SET TUBE SMOKE EVAC HIGH FLOW (TUBING) ×2 IMPLANT
SLEEVE ENDOPATH XCEL 5M (ENDOMECHANICALS) ×4 IMPLANT
SPECIMEN JAR SMALL (MISCELLANEOUS) ×2 IMPLANT
STRIP CLOSURE SKIN 1/2X4 (GAUZE/BANDAGES/DRESSINGS) ×2 IMPLANT
SUT MNCRL AB 4-0 PS2 18 (SUTURE) ×2 IMPLANT
SUT VICRYL 0 UR6 27IN ABS (SUTURE) ×3 IMPLANT
TOWEL GREEN STERILE (TOWEL DISPOSABLE) ×2 IMPLANT
TOWEL GREEN STERILE FF (TOWEL DISPOSABLE) ×2 IMPLANT
TRAY LAPAROSCOPIC MC (CUSTOM PROCEDURE TRAY) ×2 IMPLANT
TROCAR XCEL 12X100 BLDLESS (ENDOMECHANICALS) ×1 IMPLANT
TROCAR XCEL BLUNT TIP 100MML (ENDOMECHANICALS) ×2 IMPLANT
TROCAR XCEL NON-BLD 5MMX100MML (ENDOMECHANICALS) ×2 IMPLANT
WATER STERILE IRR 1000ML POUR (IV SOLUTION) ×2 IMPLANT

## 2021-05-30 NOTE — Op Note (Signed)
Preoperative diagnosis: Acute cholecystitis Postoperative diagnosis: saa Procedure: Laparoscopic cholecystectomy with ICG dye Surgeon: Dr. Harden Mo Asst: Trixie Deis, PA-C Anesthesia: General Estimated blood loss: minimal Complications: None Drains: None Specimens: Gallbladder and contents to pathology Sponge needle count was correct at completion Disposition to recovery stable condition  Indications:  57 yof with gallstones, ruq pain and continued tenderness.  We discussed lap chole.   Procedure: After informed consent was obtained she was taken to the OR.   She was given antibiotics.  She underwent a TAP block. SCDs were in place.  She was then placed under general anesthesia without complication.  She was prepped and draped in the standard sterile surgical fashion.  A surgical timeout was then performed.  I infiltrated Marcaine and made an incision in the left upper quadrant.  I inserted a 5 mm trocar using direct optical entry without injury.  I insufflated the abdomen to 15 mmHg pressure. I then placed a 12 mm trocar in the infraumbilical position.   I then placed 3 additional 5 mm trocars in epigastrium and right side of the abdomen under direct vision without complication.  I had to aspirate the gallbladder to grasp it. I retracted the gallbladder cephalad and lateral.  I did have her injected with ICG dye and used this to help identify the critical view of safety.  I was able to obtain the critical view of safety and lift the gallbladder off the liver.  I then clipped the cystic duct 3 times.  I divided it leaving 2 clips in place.  The cystic duct was viable and the clips completely traversed the duct.  I treated the artery in a similar fashion. The gallbladder was then removed from the liver bed.  I then placed the gallbladder in retrieval bag. I removed all of this from the umbilical trocar site.  Hemostasis was obtained.  I removed the 12 mm trocar and closed with 2 2-0 vicryl  sutures using the suture passer device. I then desufflated the abdomen and removed the remaining trocars. Incisoins were closed with 4-0 Monocryl and glue.  She tolerated this well was extubated transferred to recovery stable.

## 2021-05-30 NOTE — Anesthesia Postprocedure Evaluation (Signed)
Anesthesia Post Note  Patient: Alicia Parks  Procedure(s) Performed: LAPAROSCOPIC CHOLECYSTECTOMY WITH ICG DYE     Patient location during evaluation: PACU Anesthesia Type: General and Regional Level of consciousness: awake and alert Pain management: pain level controlled Vital Signs Assessment: post-procedure vital signs reviewed and stable Respiratory status: spontaneous breathing, nonlabored ventilation, respiratory function stable and patient connected to nasal cannula oxygen Cardiovascular status: blood pressure returned to baseline and stable Postop Assessment: no apparent nausea or vomiting Anesthetic complications: no   No notable events documented.  Last Vitals:  Vitals:   05/30/21 1300 05/30/21 1315  BP: 117/82 110/77  Pulse: 76 62  Resp: 17 13  Temp:    SpO2: 95% 95%    Last Pain:  Vitals:   05/30/21 1300  TempSrc:   PainSc: 0-No pain                 Lucile Didonato S

## 2021-05-30 NOTE — Discharge Instructions (Signed)
[facility] LAPAROSCOPIC SURGERY: POST OP INSTRUCTIONS  Always review your discharge instruction sheet given to you by the facility where your surgery was performed. IF YOU HAVE DISABILITY OR FAMILY LEAVE FORMS, YOU MUST BRING THEM TO THE OFFICE FOR PROCESSING.   DO NOT GIVE THEM TO YOUR DOCTOR.  A prescription for pain medication may be given to you upon discharge.  Take your pain medication as prescribed, if needed.  If narcotic pain medicine is not needed, then you may take acetaminophen (Tylenol), naprosyn (Alleve), or ibuprofen (Advil) as needed. Take your usually prescribed medications unless otherwise directed. If you need a refill on your pain medication, please contact your pharmacy.  They will contact our office to request authorization. Prescriptions will not be filled after 5pm or on week-ends. You should follow a light diet the first few days after arrival home, such as soup and crackers, etc.  Be sure to include lots of fluids daily. Most patients will experience some swelling and bruising in the area of the incisions.  Ice packs will help.  Swelling and bruising can take several days to resolve.  It is common to experience some constipation if taking pain medication after surgery.  Increasing fluid intake and taking a stool softener (such as Colace) will usually help or prevent this problem from occurring.  A mild laxative (Milk of Magnesia or Miralax) should be taken according to package instructions if there are no bowel movements after 48 hours. Unless discharge instructions indicate otherwise, you may remove your bandages 48 hours after surgery, and you may shower at that time.  You may have steri-strips (small skin tapes) in place directly over the incision.  These strips should be left on the skin for 7-10 days.  If your surgeon used skin glue on the incision, you may shower in 24 hours.  The glue will flake off over the next  2-3 weeks.  Any sutures or staples will be removed at the office during your follow-up visit. ACTIVITIES:  You may resume regular (light) daily activities beginning the next day--such as daily self-care, walking, climbing stairs--gradually increasing activities as tolerated.  You may have sexual intercourse when it is comfortable.  Refrain from any heavy lifting or straining until approved by your doctor. You may drive when you are no longer taking prescription pain medication, you can comfortably wear a seatbelt, and you can safely maneuver your car and apply brakes. RETURN TO WORK:  __________________________________________________________ Dennis Bast should see your doctor in the office for a follow-up appointment approximately 2-3 weeks after your surgery.  Make sure that you call for this appointment within a day or two after you arrive home to insure a convenient appointment time. OTHER INSTRUCTIONS: __________________________________________________________________________________________________________________________ __________________________________________________________________________________________________________________________ WHEN TO CALL YOUR DOCTOR: Fever over 101.0 Inability to urinate Continued bleeding from incision. Increased pain, redness, or drainage from the incision. Increasing abdominal pain  The clinic staff is available to answer your questions during regular business hours.  Please don't hesitate to call and ask to speak to one of the nurses for clinical concerns.  If you have a medical emergency, go to the nearest emergency room or call 911.  A surgeon from [facility] is always on call at the hospital. [address], [place], [place], [place]  [address] ? [address], [place], [place]   [address] [phone] ? [phone] ? FAX [phone] Web site: [email or web address]

## 2021-05-30 NOTE — ED Notes (Addendum)
Report called to short stay RN. All pt clothing removed and placed in belonging bag. Given to pt's significant other. NT will bring pt up at 0815 per request of short stay RN.

## 2021-05-30 NOTE — Anesthesia Preprocedure Evaluation (Addendum)
Anesthesia Evaluation  Patient identified by MRN, date of birth, ID band Patient awake    Reviewed: Allergy & Precautions, H&P , NPO status , Patient's Chart, lab work & pertinent test results  Airway Mallampati: II   Neck ROM: full    Dental   Pulmonary neg pulmonary ROS,    breath sounds clear to auscultation       Cardiovascular negative cardio ROS   Rhythm:regular Rate:Normal     Neuro/Psych  Headaches, PSYCHIATRIC DISORDERS Anxiety Depression Bipolar Disorder  Neuromuscular disease    GI/Hepatic GERD  ,cholelithiasis   Endo/Other  obese  Renal/GU      Musculoskeletal  (+) Fibromyalgia -  Abdominal   Peds  Hematology   Anesthesia Other Findings   Reproductive/Obstetrics                             Anesthesia Physical Anesthesia Plan  ASA: 2  Anesthesia Plan: General   Post-op Pain Management: Regional block   Induction: Intravenous  PONV Risk Score and Plan: 3 and Ondansetron, Dexamethasone, Midazolam and Treatment may vary due to age or medical condition  Airway Management Planned: Oral ETT  Additional Equipment:   Intra-op Plan:   Post-operative Plan: Extubation in OR  Informed Consent: I have reviewed the patients History and Physical, chart, labs and discussed the procedure including the risks, benefits and alternatives for the proposed anesthesia with the patient or authorized representative who has indicated his/her understanding and acceptance.     Dental advisory given  Plan Discussed with: CRNA, Anesthesiologist and Surgeon  Anesthesia Plan Comments:         Anesthesia Quick Evaluation

## 2021-05-30 NOTE — ED Provider Notes (Signed)
[facility] EMERGENCY DEPT Provider Note   CSN: [id number] Arrival date & time: 05/30/21  0010     History Chief Complaint  Patient presents with   Abdominal Pain    Alicia Parks is a 45 y.o. female.  Patient is a 45 year old female with past medical history of migraines, fibromyalgia, GERD.  Patient presenting today for evaluation of abdominal and flank pain.  This has been worsening over the past several weeks.  She is diagnosed a few months back with gallstones.  She has been seen by general surgeon in [place], however her surgery was postponed until after the new year.  She presents today with worsening pain to the right upper quadrant and right flank.  She was seen at [facility], than left due to prolonged wait times.  She had laboratory studies performed there showing a white count of 16.7 and increasing AST/ALT.  The history is provided by the patient.  Abdominal Pain Pain location:  RUQ and R flank Pain quality: stabbing   Pain radiates to:  Does not radiate Pain severity:  Moderate Onset quality:  Sudden Duration:  3 weeks Timing:  Intermittent Progression:  Worsening Chronicity:  New Relieved by:  Nothing Worsened by:  Movement and palpation     Past Medical History:  Diagnosis Date   Back pain    Complication of anesthesia    Ear drum perforation    Fibromyalgia    GERD (gastroesophageal reflux disease)    Migraine    Restless leg syndrome     Patient Active Problem List   Diagnosis Date Noted   Dental abscess 10/04/2020   GERD (gastroesophageal reflux disease) 10/04/2020   Generalized anxiety disorder 04/25/2020   Bipolar I disorder, most recent episode depressed (HCC) 04/25/2020   Moderate episode of recurrent major depressive disorder (HCC) 04/25/2020    Past Surgical History:  Procedure Laterality Date   TONSILLECTOMY     TUBAL LIGATION       OB History     Gravida  3   Para  3   Term  3   Preterm  0   AB  0    Living  3      SAB  0   IAB  0   Ectopic  0   Multiple  0   Live Births              Family History  Problem Relation Age of Onset   Hypertension Father    Heart disease Father    Gout Father    Osteoporosis Mother    Cancer Other     Social History   Tobacco Use   Smoking status: Never   Smokeless tobacco: Never  Substance Use Topics   Alcohol use: No   Drug use: Yes    Types: Marijuana    Home Medications Prior to Admission medications   Medication Sig Start Date End Date Taking? Authorizing Provider  diphenhydrAMINE (SOMINEX) 25 MG tablet Take 25 mg by mouth daily as needed for allergies.    [provider]  esomeprazole (NEXIUM) 20 MG packet Mix 1 packet (20 mg) with water and drink by mouth in the morning and at bedtime. 03/28/21   Wallis Bamberg, PA-C  famotidine (PEPCID) 40 MG/5ML suspension Take 2.5 mLs (20 mg total) by mouth 2 (two) times daily. 03/28/21   Wallis Bamberg, PA-C  hydrOXYzine (ATARAX) 10 MG/5ML syrup Take 12.5 mLs (25 mg total) by mouth 3 (three) times daily as  needed for anxiety. Patient not taking: Reported on 04/25/2021 03/25/21   Army Melia A, PA-C  hydrOXYzine (ATARAX/VISTARIL) 25 MG tablet TAKE 1 TABLET (25 MG TOTAL) BY MOUTH 4 (FOUR) TIMES DAILY AS NEEDED. Patient taking differently: Take 25 mg by mouth every 6 (six) hours as needed for itching or anxiety. 02/13/21   Shanna Cisco, NP  lidocaine (XYLOCAINE) 2 % solution Use as directed 15 mLs in the mouth or throat every 6 (six) hours as needed for mouth pain. 03/25/21   Jeannie Fend, PA-C  sucralfate (CARAFATE) 1 GM/10ML suspension Take 10 mLs (1 g total) by mouth 4 (four) times daily -  with meals and at bedtime. Patient not taking: Reported on 04/25/2021 03/25/21   Palumbo, April, MD  ursodiol (ACTIGALL) 300 MG capsule Take 1 capsule by mouth three times daily for 30 days. 04/25/21   Ernie Avena, MD  vitamin B-12 (CYANOCOBALAMIN) 1000 MCG tablet Take 1,000 mcg by mouth  daily.    [provider]    Allergies    Aspirin, Compazine [prochlorperazine edisylate], Effexor [venlafaxine], and Tylenol [acetaminophen]  Review of Systems   Review of Systems  Gastrointestinal:  Positive for abdominal pain.  All other systems reviewed and are negative.  Physical Exam Updated Vital Signs BP 122/85 (BP Location: Left Arm)   Pulse 66   Temp 97.8 F (36.6 C) (Oral)   Resp 17   SpO2 100%   Physical Exam Vitals and nursing note reviewed.  Constitutional:      General: She is not in acute distress.    Appearance: She is well-developed. She is not diaphoretic.  HENT:     Head: Normocephalic and atraumatic.  Cardiovascular:     Rate and Rhythm: Normal rate and regular rhythm.     Heart sounds: No murmur heard.   No friction rub. No gallop.  Pulmonary:     Effort: Pulmonary effort is normal. No respiratory distress.     Breath sounds: Normal breath sounds. No wheezing.  Abdominal:     General: Bowel sounds are normal. There is no distension.     Palpations: Abdomen is soft.     Tenderness: There is abdominal tenderness in the right upper quadrant. There is right CVA tenderness. There is no left CVA tenderness, guarding or rebound.  Musculoskeletal:        General: Normal range of motion.     Cervical back: Normal range of motion and neck supple.  Skin:    General: Skin is warm and dry.  Neurological:     General: No focal deficit present.     Mental Status: She is alert and oriented to person, place, and time.    ED Results / Procedures / Treatments   Labs (all labs ordered are listed, but only abnormal results are displayed) Labs Reviewed  COMPREHENSIVE METABOLIC PANEL - Abnormal; Notable for the following components:      Result Value   Glucose, Bld 108 (*)    Calcium 8.8 (*)    Total Protein 6.0 (*)    AST 215 (*)    ALT 111 (*)    All other components within normal limits  CBC - Abnormal; Notable for the following components:    WBC 16.7 (*)    Platelets 402 (*)    All other components within normal limits  LIPASE, BLOOD  URINALYSIS, ROUTINE W REFLEX MICROSCOPIC  PREGNANCY, URINE    EKG None  Radiology US Abdomen Limited RUQ (LIVER/GB)  Result Date:  05/29/2021 CLINICAL DATA:  Right upper quadrant abdominal pain. EXAM: ULTRASOUND ABDOMEN LIMITED RIGHT UPPER QUADRANT COMPARISON:  Ultrasound dated 04/25/2021 FINDINGS: Gallbladder: Multiple small stones within gallbladder. No gallbladder wall thickening or pericholecystic fluid. Negative sonographic Murphy's sign. Common bile duct: Diameter: 5 mm Liver: There is diffuse increased liver echogenicity most commonly seen in the setting of fatty infiltration. Superimposed inflammation or fibrosis is not excluded. Clinical correlation is recommended. Portal vein is patent on color Doppler imaging with normal direction of blood flow towards the liver. Other: None. IMPRESSION: 1. Cholelithiasis without sonographic evidence of acute cholecystitis. 2. Fatty liver. Electronically Signed   By: Elgie Collard M.D.   On: 05/29/2021 22:30    Procedures Procedures   Medications Ordered in ED Medications - No data to display  ED Course  I have reviewed the triage vital signs and the nursing notes.  Pertinent labs & imaging results that were available during my care of the patient were reviewed by me and considered in my medical decision making (see chart for details).    MDM Rules/Calculators/A&P  Patient presenting with right upper quadrant/right flank pain that has been worsening over the past several weeks.  She has known gallstones, however ultrasound performed earlier this evening at [facility] showed no cholecystitis.  She left after initial triage, but prior to being formally seen due to prolonged wait times.  Upon reviewing her labs, she has a leukocytosis and increasing LFTs.  This was discussed with Dr. Cliffton Asters from general surgery.  He is recommending patient be  evaluated by general surgery.  She will be transferred ER to ER to [facility] and will be evaluated there.  Dr. Pilar Plate in the ER made aware of situation and accepts in transfer.  Final Clinical Impression(s) / ED Diagnoses Final diagnoses:  Acute cholecystitis    Rx / DC Orders ED Discharge Orders     None        Geoffery Lyons, MD 05/30/21 [phone]

## 2021-05-30 NOTE — Interval H&P Note (Signed)
History and Physical Interval Note:  05/30/2021 9:01 AM I have seen and examined patient.   Has biliary colic maybe early cholecystitis, labs better tb normal with nl duct as well. Doubt choledocholithiasis.  Plan for lap chole today. I discussed the procedure in detail.  We discussed the risks and benefits of a laparoscopic cholecystectomy and possible cholangiogram including, but not limited to bleeding, infection, injury to surrounding structures such as the intestine or liver, bile leak, retained gallstones, need to convert to an open procedure, prolonged diarrhea, blood clots such as  DVT, common bile duct injury, anesthesia risks, and possible need for additional procedures.  The likelihood of improvement in symptoms and return to the patient's normal status is good. We discussed the typical post-operative recovery course.  Alicia Parks  has presented today for surgery, with the diagnosis of Cholelithiasis.  The various methods of treatment have been discussed with the patient and family. After consideration of risks, benefits and other options for treatment, the patient has consented to  Procedure(s): LAPAROSCOPIC CHOLECYSTECTOMY WITH ICG DYE (N/A) as a surgical intervention.  The patient's history has been reviewed, patient examined, no change in status, stable for surgery.  I have reviewed the patient's chart and labs.  Questions were answered to the patient's satisfaction.     Alicia Parks

## 2021-05-30 NOTE — Transfer of Care (Signed)
Immediate Anesthesia Transfer of Care Note  Patient: Alicia Parks  Procedure(s) Performed: LAPAROSCOPIC CHOLECYSTECTOMY WITH ICG DYE  Patient Location: PACU  Anesthesia Type:GA combined with regional for post-op pain  Level of Consciousness: drowsy  Airway & Oxygen Therapy: Patient Spontanous Breathing and Patient connected to nasal cannula oxygen  Post-op Assessment: Report given to RN and Post -op Vital signs reviewed and stable  Post vital signs: Reviewed and stable  Last Vitals:  Vitals Value Taken Time  BP 157/91 05/30/21 1059  Temp    Pulse 70 05/30/21 1059  Resp 13 05/30/21 1059  SpO2 97 % 05/30/21 1059  Vitals shown include unvalidated device data.  Last Pain:  Vitals:   05/30/21 0915  TempSrc:   PainSc: 0-No pain      Patients Stated Pain Goal: 3 (05/30/21 0908)  Complications: No notable events documented.

## 2021-05-30 NOTE — ED Triage Notes (Signed)
Pt presents with Right side abd pain x1 week. Increase pain over the last 2 days. Pt seen at [facility] ED tonight. They collected blood work and [place]. She left AMA after realizing the wait was 5 hours.  Pt is scheduled to have her gallbladder removed in January

## 2021-05-30 NOTE — H&P (Signed)
CC: RUQ pain   HPI: Alicia Parks is an 45 y.o. female who is here for hx migraines, fibromyalgia gerd with RUQ pain intermittently for many weeks, worse today. Radiates to R back today. Pain is sharp. Associated nausea.  She has previously been seen in our practice by Dr. Freida Busman and was recommended surgery for her gallbladder.  She is also been [facility] where she has seen surgeons there and recommended surgery.  Her last visit with [place] was back in November of this year.  She presented to [facility], ER but subsequently left due to wait time and went [facility].  She was seen and evaluated found to have right upper quadrant tenderness, leukocytosis, transaminitis.  She was then transferred back over to the [facility], ER for further evaluation.  Past Medical History:  Diagnosis Date   Back pain    Complication of anesthesia    Ear drum perforation    Fibromyalgia    GERD (gastroesophageal reflux disease)    Migraine    Restless leg syndrome     Past Surgical History:  Procedure Laterality Date   TONSILLECTOMY     TUBAL LIGATION      Family History  Problem Relation Age of Onset   Hypertension Father    Heart disease Father    Gout Father    Osteoporosis Mother    Cancer Other     Social:  reports that she has never smoked. She has never used smokeless tobacco. She reports current drug use. Drug: Marijuana. She reports that she does not drink alcohol.  Allergies:  Allergies  Allergen Reactions   Aspirin Other (See Comments)    "gives me the jitters"   Compazine [Prochlorperazine Edisylate] Other (See Comments)    Makes body move funny. Shaking   Effexor [Venlafaxine]     Nightmares and night sweats   Tylenol [Acetaminophen] Nausea Only and Rash    Medications: I have reviewed the patient's current medications.  Results for orders placed or performed during the hospital encounter of 05/30/21 (from the past 48 hour(s))  Lipase, blood     Status: None    Collection Time: 05/30/21 12:30 AM  Result Value Ref Range   Lipase 44 11 - 51 U/L    Comment: Performed at [facility], [address] [place], [place] [address]  Comprehensive metabolic panel     Status: Abnormal   Collection Time: 05/30/21 12:30 AM  Result Value Ref Range   Sodium 140 135 - 145 mmol/L   Potassium 3.7 3.5 - 5.1 mmol/L   Chloride 105 98 - 111 mmol/L   CO2 27 22 - 32 mmol/L   Glucose, Bld 108 (H) 70 - 99 mg/dL    Comment: Glucose reference range applies only to samples taken after fasting for at least 8 hours.   BUN 10 6 - 20 mg/dL   Creatinine, Ser 1.01 0.44 - 1.00 mg/dL   Calcium 8.8 (L) 8.9 - 10.3 mg/dL   Total Protein 6.0 (L) 6.5 - 8.1 g/dL   Albumin 3.8 3.5 - 5.0 g/dL   AST 751 (H) 15 - 41 U/L   ALT 111 (H) 0 - 44 U/L   Alkaline Phosphatase 69 38 - 126 U/L   Total Bilirubin 0.6 0.3 - 1.2 mg/dL   GFR, Estimated >02 >58 mL/min    Comment: (NOTE) Calculated using the CKD-EPI Creatinine Equation (2021)    Anion gap 8 5 - 15  Comment: Performed at [facility], [address] [place], [place], [place] [address]  CBC     Status: Abnormal   Collection Time: 05/30/21 12:30 AM  Result Value Ref Range   WBC 16.7 (H) 4.0 - 10.5 K/uL   RBC 4.58 3.87 - 5.11 MIL/uL   Hemoglobin 13.7 12.0 - 15.0 g/dL   HCT 55.9 74.1 - 63.8 %   MCV 90.2 80.0 - 100.0 fL   MCH 29.9 26.0 - 34.0 pg   MCHC 33.2 30.0 - 36.0 g/dL   RDW 45.3 64.6 - 80.3 %   Platelets 402 (H) 150 - 400 K/uL   nRBC 0.0 0.0 - 0.2 %    Comment: Performed at [facility], [address], [place], [place] [address]    US Abdomen Limited RUQ (LIVER/GB)  Result Date: 05/29/2021 CLINICAL DATA:  Right upper quadrant abdominal pain. EXAM: ULTRASOUND ABDOMEN LIMITED RIGHT UPPER QUADRANT COMPARISON:  Ultrasound dated 04/25/2021 FINDINGS: Gallbladder: Multiple small stones within gallbladder. No gallbladder wall thickening or pericholecystic fluid. Negative  sonographic Murphy's sign. Common bile duct: Diameter: 5 mm Liver: There is diffuse increased liver echogenicity most commonly seen in the setting of fatty infiltration. Superimposed inflammation or fibrosis is not excluded. Clinical correlation is recommended. Portal vein is patent on color Doppler imaging with normal direction of blood flow towards the liver. Other: None. IMPRESSION: 1. Cholelithiasis without sonographic evidence of acute cholecystitis. 2. Fatty liver. Electronically Signed   By: Elgie Collard M.D.   On: 05/29/2021 22:30    ROS - all of the below systems have been reviewed with the patient and positives are indicated with bold text General: chills, fever or night sweats Eyes: blurry vision or double vision ENT: epistaxis or sore throat Allergy/Immunology: itchy/watery eyes or nasal congestion Hematologic/Lymphatic: bleeding problems, blood clots or swollen lymph nodes Endocrine: temperature intolerance or unexpected weight changes Breast: new or changing breast lumps or nipple discharge Resp: cough, shortness of breath, or wheezing CV: chest pain or dyspnea on exertion GI: as per HPI GU: dysuria, trouble voiding, or hematuria MSK: joint pain or joint stiffness Neuro: TIA or stroke symptoms Derm: pruritus and skin lesion changes Psych: anxiety and depression  PE Blood pressure 122/85, pulse 66, temperature 97.8 F (36.6 C), temperature source Oral, resp. rate 17, SpO2 100 %. Constitutional: NAD; conversant; no deformities Eyes: Moist conjunctiva; no lid lag; anicteric; PERRL Neck: Trachea midline; no thyromegaly Lungs: Normal respiratory effort; no tactile fremitus CV: RRR; no palpable thrills; no pitting edema GI: Abd soft, mildly ttp RUQ; no rebound nor guarding; nondistened; no palpable hepatosplenomegaly MSK: Normal range of motion of extremities; no clubbing/cyanosis Psychiatric: Appropriate affect; alert and oriented x3 Lymphatic: No palpable cervical or  axillary lymphadenopathy  Results for orders placed or performed during the hospital encounter of 05/30/21 (from the past 48 hour(s))  Lipase, blood     Status: None   Collection Time: 05/30/21 12:30 AM  Result Value Ref Range   Lipase 44 11 - 51 U/L    Comment: Performed at [facility], [address] [place], [place], [place] [address]  Comprehensive metabolic panel     Status: Abnormal   Collection Time: 05/30/21 12:30 AM  Result Value Ref Range   Sodium 140 135 - 145 mmol/L   Potassium 3.7 3.5 - 5.1 mmol/L   Chloride 105 98 - 111 mmol/L   CO2 27 22 - 32 mmol/L   Glucose, Bld 108 (H) 70 - 99 mg/dL    Comment: Glucose  reference range applies only to samples taken after fasting for at least 8 hours.   BUN 10 6 - 20 mg/dL   Creatinine, Ser 0.74 0.44 - 1.00 mg/dL   Calcium 8.8 (L) 8.9 - 10.3 mg/dL   Total Protein 6.0 (L) 6.5 - 8.1 g/dL   Albumin 3.8 3.5 - 5.0 g/dL   AST 215 (H) 15 - 41 U/L   ALT 111 (H) 0 - 44 U/L   Alkaline Phosphatase 69 38 - 126 U/L   Total Bilirubin 0.6 0.3 - 1.2 mg/dL   GFR, Estimated >60 >60 mL/min    Comment: (NOTE) Calculated using the CKD-EPI Creatinine Equation (2021)    Anion gap 8 5 - 15    Comment: Performed at [facility], [address] [place], [place] [address]  CBC     Status: Abnormal   Collection Time: 05/30/21 12:30 AM  Result Value Ref Range   WBC 16.7 (H) 4.0 - 10.5 K/uL   RBC 4.58 3.87 - 5.11 MIL/uL   Hemoglobin 13.7 12.0 - 15.0 g/dL   HCT 41.3 36.0 - 46.0 %   MCV 90.2 80.0 - 100.0 fL   MCH 29.9 26.0 - 34.0 pg   MCHC 33.2 30.0 - 36.0 g/dL   RDW 13.0 11.5 - 15.5 %   Platelets 402 (H) 150 - 400 K/uL   nRBC 0.0 0.0 - 0.2 %    Comment: Performed at [facility], [place], [place] [address]    US Abdomen Limited RUQ (LIVER/GB)  Result Date: 05/29/2021 CLINICAL DATA:  Right upper quadrant abdominal pain. EXAM: ULTRASOUND ABDOMEN LIMITED RIGHT UPPER QUADRANT  COMPARISON:  Ultrasound dated 04/25/2021 FINDINGS: Gallbladder: Multiple small stones within gallbladder. No gallbladder wall thickening or pericholecystic fluid. Negative sonographic Murphy's sign. Common bile duct: Diameter: 5 mm Liver: There is diffuse increased liver echogenicity most commonly seen in the setting of fatty infiltration. Superimposed inflammation or fibrosis is not excluded. Clinical correlation is recommended. Portal vein is patent on color Doppler imaging with normal direction of blood flow towards the liver. Other: None. IMPRESSION: 1. Cholelithiasis without sonographic evidence of acute cholecystitis. 2. Fatty liver. Electronically Signed   By: Anner Crete M.D.   On: 05/29/2021 22:30     A/P: PAXTYN CHITWOOD is an 45 y.o. female with acute cholecystitis, possible transient/early choledocholithiasis  -Admit to surgery -NPO, MIVF, IV abx -Repeat lfts this morning -Additional treatment plans based on labs this am. Anticipate cholecystectomy this admit  Nadeen Landau, MD [facility] Use [email or web address] to contact on call provider

## 2021-05-30 NOTE — Anesthesia Procedure Notes (Addendum)
  Anesthesia Regional Block: TAP block   Pre-Anesthetic Checklist: , timeout performed,  Correct Patient, Correct Site, Correct Laterality,  Correct Procedure, Correct Position, site marked,  Risks and benefits discussed,  Surgical consent,  Pre-op evaluation,  At surgeon's request and post-op pain management  Laterality: Left  Prep: chloraprep       Needles:  Injection technique: Single-shot  Needle Type: Echogenic Needle     Needle Length: 9cm  Needle Gauge: 21     Additional Needles:   Narrative:  Start time: 05/30/2021 9:06 AM End time: 05/30/2021 9:17 AM Injection made incrementally with aspirations every 5 mL.  Performed by: Personally  Anesthesiologist: Achille Rich, MD  Additional Notes: Pt tolerated the procedure well.

## 2021-05-30 NOTE — ED Notes (Signed)
Called [facility] to transport patient to [facility] emergency--Dr Angelena Form excepting

## 2021-05-30 NOTE — Anesthesia Procedure Notes (Signed)
Procedure Name: Intubation Date/Time: 05/30/2021 9:49 AM Performed by: Colin Benton, CRNA Pre-anesthesia Checklist: Patient identified, Emergency Drugs available, Suction available and Patient being monitored Patient Re-evaluated:Patient Re-evaluated prior to induction Oxygen Delivery Method: Circle system utilized Preoxygenation: Pre-oxygenation with 100% oxygen Induction Type: IV induction Ventilation: Mask ventilation without difficulty and Oral airway inserted - appropriate to patient size Laryngoscope Size: Glidescope and 3 Tube type: Oral Tube size: 7.0 mm Number of attempts: 3 Airway Equipment and Method: Rigid stylet and Video-laryngoscopy Placement Confirmation: ETT inserted through vocal cords under direct vision, positive ETCO2 and breath sounds checked- equal and bilateral Secured at: 21 cm Tube secured with: Tape Dental Injury: Teeth and Oropharynx as per pre-operative assessment  Comments: Dl x 1 with Mil 2 by CRNA.  Grade 2 view.  Unable to pass ETT anteriorly through cords. DL x 2 and 3 by Dr. Marcie Bal with Mil 2 and MAC 3.  Easy BMV in between attempts.  Glidescope 3 used and grade 1 view.  EBBS and VSS.

## 2021-05-30 NOTE — Anesthesia Procedure Notes (Addendum)
  Anesthesia Regional Block: TAP block   Pre-Anesthetic Checklist: , timeout performed,  Correct Patient, Correct Site, Correct Laterality,  Correct Procedure, Correct Position, site marked,  Risks and benefits discussed,  Surgical consent,  Pre-op evaluation,  At surgeon's request and post-op pain management  Laterality: Right  Prep: chloraprep       Needles:  Injection technique: Single-shot  Needle Type: Echogenic Needle     Needle Length: 9cm  Needle Gauge: 21     Additional Needles:   Narrative:  Start time: 05/30/2021 8:57 AM End time: 05/30/2021 9:06 AM Injection made incrementally with aspirations every 5 mL.  Performed by: Personally  Anesthesiologist: Achille Rich, MD  Additional Notes: Pt tolerated the procedure well.

## 2021-05-31 ENCOUNTER — Encounter (HOSPITAL_COMMUNITY): Payer: Self-pay | Admitting: General Surgery

## 2021-05-31 LAB — SURGICAL PATHOLOGY

## 2021-06-03 ENCOUNTER — Encounter (HOSPITAL_COMMUNITY): Payer: Self-pay

## 2021-06-03 ENCOUNTER — Emergency Department (HOSPITAL_COMMUNITY)
Admission: EM | Admit: 2021-06-03 | Discharge: 2021-06-03 | Disposition: A | Discharge status: Home / Self Care | Payer: BLUE CROSS/BLUE SHIELD | Attending: Emergency Medicine | Admitting: Emergency Medicine

## 2021-06-03 ENCOUNTER — Other Ambulatory Visit: Payer: Self-pay

## 2021-06-03 ENCOUNTER — Other Ambulatory Visit (HOSPITAL_COMMUNITY): Payer: Self-pay | Admitting: General Surgery

## 2021-06-03 ENCOUNTER — Other Ambulatory Visit: Payer: Self-pay | Admitting: General Surgery

## 2021-06-03 ENCOUNTER — Emergency Department (HOSPITAL_COMMUNITY): Payer: BLUE CROSS/BLUE SHIELD

## 2021-06-03 DIAGNOSIS — Z5321 Procedure and treatment not carried out due to patient leaving prior to being seen by health care provider: Secondary | ICD-10-CM | POA: Diagnosis not present

## 2021-06-03 DIAGNOSIS — R109 Unspecified abdominal pain: Secondary | ICD-10-CM | POA: Insufficient documentation

## 2021-06-03 DIAGNOSIS — G8918 Other acute postprocedural pain: Secondary | ICD-10-CM | POA: Diagnosis not present

## 2021-06-03 DIAGNOSIS — R1011 Right upper quadrant pain: Secondary | ICD-10-CM

## 2021-06-03 LAB — CBC WITH DIFFERENTIAL/PLATELET
Abs Immature Granulocytes: 0.03 10*3/uL (ref 0.00–0.07)
Basophils Absolute: 0 10*3/uL (ref 0.0–0.1)
Basophils Relative: 0 %
Eosinophils Absolute: 0.2 10*3/uL (ref 0.0–0.5)
Eosinophils Relative: 2 %
HCT: 43.1 % (ref 36.0–46.0)
Hemoglobin: 14.3 g/dL (ref 12.0–15.0)
Immature Granulocytes: 0 %
Lymphocytes Relative: 23 %
Lymphs Abs: 3.1 10*3/uL (ref 0.7–4.0)
MCH: 30.2 pg (ref 26.0–34.0)
MCHC: 33.2 g/dL (ref 30.0–36.0)
MCV: 91.1 fL (ref 80.0–100.0)
Monocytes Absolute: 0.7 10*3/uL (ref 0.1–1.0)
Monocytes Relative: 5 %
Neutro Abs: 9.6 10*3/uL — ABNORMAL HIGH (ref 1.7–7.7)
Neutrophils Relative %: 70 %
Platelets: 421 10*3/uL — ABNORMAL HIGH (ref 150–400)
RBC: 4.73 MIL/uL (ref 3.87–5.11)
RDW: 12.6 % (ref 11.5–15.5)
WBC: 13.7 10*3/uL — ABNORMAL HIGH (ref 4.0–10.5)
nRBC: 0 % (ref 0.0–0.2)

## 2021-06-03 LAB — COMPREHENSIVE METABOLIC PANEL
ALT: 43 U/L (ref 0–44)
AST: 20 U/L (ref 15–41)
Albumin: 3.9 g/dL (ref 3.5–5.0)
Alkaline Phosphatase: 62 U/L (ref 38–126)
Anion gap: 5 (ref 5–15)
BUN: 6 mg/dL (ref 6–20)
CO2: 27 mmol/L (ref 22–32)
Calcium: 8.7 mg/dL — ABNORMAL LOW (ref 8.9–10.3)
Chloride: 105 mmol/L (ref 98–111)
Creatinine, Ser: 0.68 mg/dL (ref 0.44–1.00)
GFR, Estimated: >60 mL/min (ref >60)
Glucose, Bld: 106 mg/dL — ABNORMAL HIGH (ref 70–99)
Potassium: 3.7 mmol/L (ref 3.5–5.1)
Sodium: 137 mmol/L (ref 135–145)
Total Bilirubin: 0.6 mg/dL (ref 0.3–1.2)
Total Protein: 6.7 g/dL (ref 6.5–8.1)

## 2021-06-03 LAB — LIPASE, BLOOD: Lipase: 29 U/L (ref 11–51)

## 2021-06-03 MED ORDER — IOHEXOL 350 MG/ML SOLN
80.0000 mL | Freq: Once | INTRAVENOUS | Status: AC | PRN
  Administered 2021-06-03: 80 mL via INTRAVENOUS

## 2021-06-03 NOTE — ED Provider Notes (Signed)
Emergency Medicine Provider Triage Evaluation Note  Alicia Parks , a 45 y.o. female  was evaluated in triage.  Pt complains of right-sided abdominal pain and flank pain since yesterday.  Patient reports she had cholecystectomy last Wednesday.  She discussed her current pain with her surgeons office today and was instructed to come to the emergency room for further evaluation.  Review of Systems  Positive: Abdominal pain, right flank pain Negative: Vomiting, fever, constipation  Physical Exam  BP 124/85 (BP Location: Left Arm)   Pulse 90   Temp 97.9 F (36.6 C) (Oral)   Resp 18   Ht 5\' 4"  (1.626 m)   Wt 84.8 kg   LMP 05/15/2021 (Approximate)   SpO2 100%   BMI 32.10 kg/m  Gen:   Awake, no distress   Resp:  Normal effort  MSK:   Moves extremities without difficulty Other:    Medical Decision Making  Medically screening exam initiated at 5:21 PM.  Appropriate orders placed.  SMT LOKEY was informed that the remainder of the evaluation will be completed by another provider, this initial triage assessment does not replace that evaluation, and the importance of remaining in the ED until their evaluation is complete.     [name], PA-C 06/03/21 1723    14/12/22, MD 06/03/21 [phone]

## 2021-06-03 NOTE — ED Triage Notes (Signed)
Patient c/o right sided abdominal pain and states she recently had her gall bladder removed. Patient states she was told to come to the ED to r/o bile duct obstruction

## 2021-06-03 NOTE — ED Notes (Signed)
Pt told registration that she was leaving after iv removal.

## 2021-06-04 ENCOUNTER — Encounter (HOSPITAL_COMMUNITY): Payer: Self-pay

## 2021-06-04 ENCOUNTER — Ambulatory Visit (HOSPITAL_BASED_OUTPATIENT_CLINIC_OR_DEPARTMENT_OTHER): Payer: BLUE CROSS/BLUE SHIELD

## 2021-06-04 ENCOUNTER — Other Ambulatory Visit: Payer: Self-pay

## 2021-06-04 ENCOUNTER — Emergency Department (HOSPITAL_COMMUNITY)
Admission: EM | Admit: 2021-06-04 | Discharge: 2021-06-05 | Disposition: A | Discharge status: Home / Self Care | Payer: BLUE CROSS/BLUE SHIELD | Attending: Emergency Medicine | Admitting: Emergency Medicine

## 2021-06-04 DIAGNOSIS — R1031 Right lower quadrant pain: Secondary | ICD-10-CM | POA: Insufficient documentation

## 2021-06-04 DIAGNOSIS — R1032 Left lower quadrant pain: Secondary | ICD-10-CM | POA: Insufficient documentation

## 2021-06-04 DIAGNOSIS — M549 Dorsalgia, unspecified: Secondary | ICD-10-CM | POA: Diagnosis not present

## 2021-06-04 DIAGNOSIS — R300 Dysuria: Secondary | ICD-10-CM | POA: Insufficient documentation

## 2021-06-04 DIAGNOSIS — Z5321 Procedure and treatment not carried out due to patient leaving prior to being seen by health care provider: Secondary | ICD-10-CM | POA: Diagnosis not present

## 2021-06-04 LAB — COMPREHENSIVE METABOLIC PANEL
ALT: 41 U/L (ref 0–44)
AST: 23 U/L (ref 15–41)
Albumin: 3.4 g/dL — ABNORMAL LOW (ref 3.5–5.0)
Alkaline Phosphatase: 78 U/L (ref 38–126)
Anion gap: 8 (ref 5–15)
BUN: 9 mg/dL (ref 6–20)
CO2: 28 mmol/L (ref 22–32)
Calcium: 9.2 mg/dL (ref 8.9–10.3)
Chloride: 106 mmol/L (ref 98–111)
Creatinine, Ser: 0.77 mg/dL (ref 0.44–1.00)
GFR, Estimated: >60 mL/min (ref >60)
Glucose, Bld: 118 mg/dL — ABNORMAL HIGH (ref 70–99)
Potassium: 3.9 mmol/L (ref 3.5–5.1)
Sodium: 142 mmol/L (ref 135–145)
Total Bilirubin: 0.9 mg/dL (ref 0.3–1.2)
Total Protein: 6.5 g/dL (ref 6.5–8.1)

## 2021-06-04 LAB — CBC WITH DIFFERENTIAL/PLATELET
Abs Immature Granulocytes: 0.04 10*3/uL (ref 0.00–0.07)
Basophils Absolute: 0 10*3/uL (ref 0.0–0.1)
Basophils Relative: 0 %
Eosinophils Absolute: 0.4 10*3/uL (ref 0.0–0.5)
Eosinophils Relative: 2 %
HCT: 41.8 % (ref 36.0–46.0)
Hemoglobin: 13.7 g/dL (ref 12.0–15.0)
Immature Granulocytes: 0 %
Lymphocytes Relative: 24 %
Lymphs Abs: 3.6 10*3/uL (ref 0.7–4.0)
MCH: 30.2 pg (ref 26.0–34.0)
MCHC: 32.8 g/dL (ref 30.0–36.0)
MCV: 92.3 fL (ref 80.0–100.0)
Monocytes Absolute: 0.8 10*3/uL (ref 0.1–1.0)
Monocytes Relative: 5 %
Neutro Abs: 10 10*3/uL — ABNORMAL HIGH (ref 1.7–7.7)
Neutrophils Relative %: 69 %
Platelets: 436 10*3/uL — ABNORMAL HIGH (ref 150–400)
RBC: 4.53 MIL/uL (ref 3.87–5.11)
RDW: 12.8 % (ref 11.5–15.5)
WBC: 14.8 10*3/uL — ABNORMAL HIGH (ref 4.0–10.5)
nRBC: 0 % (ref 0.0–0.2)

## 2021-06-04 LAB — URINALYSIS, ROUTINE W REFLEX MICROSCOPIC
Bilirubin Urine: NEGATIVE
Glucose, UA: NEGATIVE mg/dL
Hgb urine dipstick: NEGATIVE
Ketones, ur: NEGATIVE mg/dL
Leukocytes,Ua: NEGATIVE
Nitrite: NEGATIVE
Protein, ur: NEGATIVE mg/dL
Specific Gravity, Urine: >1.03 — ABNORMAL HIGH (ref 1.005–1.030)
pH: 6 (ref 5.0–8.0)

## 2021-06-04 LAB — LIPASE, BLOOD: Lipase: 29 U/L (ref 11–51)

## 2021-06-04 NOTE — ED Triage Notes (Signed)
Pt here for dysuria that's been ongoing since this morning. Pt stating that she's having back pain and bilateral flank pain. Pt not endorsing any fevers or nausea at this time.

## 2021-06-04 NOTE — ED Provider Notes (Signed)
Emergency Medicine Provider Triage Evaluation Note  Alicia Parks , a 45 y.o. female  was evaluated in triage.  Pt complains of dysuria, bilateral flank pain.  Patient is 5 days postop lap Chole.  States she has some intermittent abdominal cramping.  Woke up this morning and had some bilateral lower flank pain and dysuria.  No hematuria.  She feels like she has urinary tract infection.  Was seen at [facility], left after medical screening exam however sounds like she had CT scan which did not show any significant abnormality.  No fevers, emesis.  No chest pain, shortness of breath, lower extremity swelling.  Review of Systems  Positive: Dysuria, flank pain Negative: Fever, emesis  Physical Exam  BP 140/89 (BP Location: Left Arm)    Pulse 84    Temp 98.7 F (37.1 C) (Oral)    Resp 20    LMP 05/15/2021 (Approximate)    SpO2 100%  Gen:   Awake, no distress   Resp:  Normal effort  MSK:   Moves extremities without difficulty, nontender posterior calves ABD:  Diffusely tender to lower abdomen, tender diffusely to bilateral lower flanks. Other:    Medical Decision Making  Medically screening exam initiated at 10:41 PM.  Appropriate orders placed.  Alicia Parks was informed that the remainder of the evaluation will be completed by another provider, this initial triage assessment does not replace that evaluation, and the importance of remaining in the ED until their evaluation is complete.  Dysuria, post op complication   Katyra Tomassetti A, PA-C 06/04/21 2241    Pollyann Savoy, MD 06/06/21 [phone]

## 2021-06-05 MED ORDER — HYDROCODONE-ACETAMINOPHEN 5-325 MG PO TABS
1.0000 | ORAL_TABLET | Freq: Once | ORAL | Status: DC
  Filled 2021-06-05: qty 1

## 2021-06-05 MED ORDER — KETOROLAC TROMETHAMINE 30 MG/ML IJ SOLN
30.0000 mg | Freq: Once | INTRAMUSCULAR | Status: AC
  Administered 2021-06-05: 02:00:00 30 mg via INTRAMUSCULAR
  Filled 2021-06-05: qty 1

## 2021-06-06 MED ORDER — INDOCYANINE GREEN 25 MG IV SOLR
INTRAVENOUS | Status: DC | PRN
  Administered 2021-05-30: 2.5 mg via INTRAVENOUS

## 2021-06-06 NOTE — Addendum Note (Signed)
Addendum  created 06/06/21 1533 by Adair Laundry, CRNA   Intraprocedure Meds edited

## 2021-06-11 ENCOUNTER — Other Ambulatory Visit: Payer: Self-pay | Admitting: Emergency Medicine

## 2021-06-12 ENCOUNTER — Other Ambulatory Visit: Payer: Self-pay

## 2021-06-13 ENCOUNTER — Emergency Department (HOSPITAL_COMMUNITY)
Admission: EM | Admit: 2021-06-13 | Discharge: 2021-06-14 | Disposition: A | Discharge status: Home / Self Care | Payer: BLUE CROSS/BLUE SHIELD | Attending: Emergency Medicine | Admitting: Emergency Medicine

## 2021-06-13 ENCOUNTER — Other Ambulatory Visit: Payer: Self-pay

## 2021-06-13 DIAGNOSIS — R112 Nausea with vomiting, unspecified: Secondary | ICD-10-CM | POA: Diagnosis not present

## 2021-06-13 DIAGNOSIS — D75838 Other thrombocytosis: Secondary | ICD-10-CM | POA: Insufficient documentation

## 2021-06-13 DIAGNOSIS — D72829 Elevated white blood cell count, unspecified: Secondary | ICD-10-CM | POA: Insufficient documentation

## 2021-06-13 DIAGNOSIS — M545 Low back pain, unspecified: Secondary | ICD-10-CM | POA: Diagnosis not present

## 2021-06-13 DIAGNOSIS — W01198A Fall on same level from slipping, tripping and stumbling with subsequent striking against other object, initial encounter: Secondary | ICD-10-CM | POA: Insufficient documentation

## 2021-06-13 DIAGNOSIS — R109 Unspecified abdominal pain: Secondary | ICD-10-CM | POA: Insufficient documentation

## 2021-06-13 DIAGNOSIS — R55 Syncope and collapse: Secondary | ICD-10-CM | POA: Insufficient documentation

## 2021-06-13 DIAGNOSIS — N9489 Other specified conditions associated with female genital organs and menstrual cycle: Secondary | ICD-10-CM | POA: Diagnosis not present

## 2021-06-13 DIAGNOSIS — N12 Tubulo-interstitial nephritis, not specified as acute or chronic: Secondary | ICD-10-CM

## 2021-06-13 DIAGNOSIS — K219 Gastro-esophageal reflux disease without esophagitis: Secondary | ICD-10-CM | POA: Insufficient documentation

## 2021-06-13 DIAGNOSIS — E860 Dehydration: Secondary | ICD-10-CM

## 2021-06-13 DIAGNOSIS — R0789 Other chest pain: Secondary | ICD-10-CM

## 2021-06-13 DIAGNOSIS — I959 Hypotension, unspecified: Secondary | ICD-10-CM

## 2021-06-13 DIAGNOSIS — R42 Dizziness and giddiness: Secondary | ICD-10-CM | POA: Diagnosis not present

## 2021-06-13 MED ORDER — SODIUM CHLORIDE 0.9 % IV SOLN: INTRAVENOUS | Status: DC

## 2021-06-13 MED ORDER — SODIUM CHLORIDE 0.9 % IV BOLUS
1000.0000 mL | Freq: Once | INTRAVENOUS | Status: AC
  Administered 2021-06-13: 1000 mL via INTRAVENOUS

## 2021-06-13 NOTE — ED Provider Notes (Signed)
Angiocath insertion Performed by: Dierdre Forth  Consent: Verbal consent obtained. Risks and benefits: risks, benefits and alternatives were discussed Time out: Immediately prior to procedure a "time out" was called to verify the correct patient, procedure, equipment, support staff and site/side marked as required.  Preparation: Patient was prepped and draped in the usual sterile fashion.  Vein Location: L AC  Not Ultrasound Guided  Gauge: 20ga  Normal blood return and flush without difficulty Patient tolerance: Patient tolerated the procedure well with no immediate complications.     Alicia Parks, Alicia Parks 06/13/21 2347    Geoffery Lyons, MD 06/14/21 [phone]

## 2021-06-13 NOTE — ED Triage Notes (Signed)
Pt reports having a near syncope episode while in [place].

## 2021-06-13 NOTE — ED Provider Notes (Signed)
Emergency Medicine Provider Triage Evaluation Note  Alicia Parks , a 45 y.o. female  was evaluated in triage.  Pt complains of syncope.  Pt reports she slipped and fell this afternoon and hit her low back.  Has had low back pain since that time.  Pt reports she gave plasma around 5pm and felt poorly there with nausea and lightheadedness. She was given an ice pack and felt some better.  She reports tonight at [facility] she had a full syncopal episode with vomiting x2.  Denies hx of the same. Pt reports she did have some chest pain with the episode.  Review of Systems  Positive: Syncope, nausea, vomiting.  Negative: Fever, chills  Physical Exam  BP (!) 82/60    Pulse 76    Temp 98.5 F (36.9 C) (Oral)    Resp 16    Ht 5\' 4"  (1.626 m)    Wt 81.6 kg    LMP 05/15/2021 (Approximate)    SpO2 99%    BMI 30.90 kg/m   Gen:   Awake, no distress   Resp:  Normal effort  MSK:   Moves extremities without difficulty  Other:  Pale, hypotensive  Medical Decision Making  Medically screening exam initiated at 11:27 PM.  Appropriate orders placed.  KESIA DALTO was informed that the remainder of the evaluation will be completed by another provider, this initial triage assessment does not replace that evaluation, and the importance of remaining in the ED until their evaluation is complete.  Syncope, chest pain.   Casady Voshell, [name] 06/13/21 2330    2331, MD 06/14/21 [phone]

## 2021-06-14 ENCOUNTER — Emergency Department (HOSPITAL_COMMUNITY): Payer: BLUE CROSS/BLUE SHIELD

## 2021-06-14 LAB — CBC WITH DIFFERENTIAL/PLATELET
Abs Immature Granulocytes: 0.04 10*3/uL (ref 0.00–0.07)
Basophils Absolute: 0.1 10*3/uL (ref 0.0–0.1)
Basophils Relative: 0 %
Eosinophils Absolute: 0.4 10*3/uL (ref 0.0–0.5)
Eosinophils Relative: 2 %
HCT: 46.6 % — ABNORMAL HIGH (ref 36.0–46.0)
Hemoglobin: 15.4 g/dL — ABNORMAL HIGH (ref 12.0–15.0)
Immature Granulocytes: 0 %
Lymphocytes Relative: 25 %
Lymphs Abs: 3.9 10*3/uL (ref 0.7–4.0)
MCH: 30.5 pg (ref 26.0–34.0)
MCHC: 33 g/dL (ref 30.0–36.0)
MCV: 92.3 fL (ref 80.0–100.0)
Monocytes Absolute: 0.7 10*3/uL (ref 0.1–1.0)
Monocytes Relative: 4 %
Neutro Abs: 10.6 10*3/uL — ABNORMAL HIGH (ref 1.7–7.7)
Neutrophils Relative %: 69 %
Platelets: 473 10*3/uL — ABNORMAL HIGH (ref 150–400)
RBC: 5.05 MIL/uL (ref 3.87–5.11)
RDW: 12.4 % (ref 11.5–15.5)
WBC: 15.7 10*3/uL — ABNORMAL HIGH (ref 4.0–10.5)
nRBC: 0 % (ref 0.0–0.2)

## 2021-06-14 LAB — COMPREHENSIVE METABOLIC PANEL
ALT: 19 U/L (ref 0–44)
AST: 17 U/L (ref 15–41)
Albumin: 3.4 g/dL — ABNORMAL LOW (ref 3.5–5.0)
Alkaline Phosphatase: 50 U/L (ref 38–126)
Anion gap: 6 (ref 5–15)
BUN: 13 mg/dL (ref 6–20)
CO2: 25 mmol/L (ref 22–32)
Calcium: 8.6 mg/dL — ABNORMAL LOW (ref 8.9–10.3)
Chloride: 107 mmol/L (ref 98–111)
Creatinine, Ser: 0.79 mg/dL (ref 0.44–1.00)
GFR, Estimated: >60 mL/min (ref >60)
Glucose, Bld: 111 mg/dL — ABNORMAL HIGH (ref 70–99)
Potassium: 4.2 mmol/L (ref 3.5–5.1)
Sodium: 138 mmol/L (ref 135–145)
Total Bilirubin: 0.6 mg/dL (ref 0.3–1.2)
Total Protein: 5.9 g/dL — ABNORMAL LOW (ref 6.5–8.1)

## 2021-06-14 LAB — I-STAT BETA HCG BLOOD, ED (MC, WL, AP ONLY): I-stat hCG, quantitative: <5 m[IU]/mL (ref <5)

## 2021-06-14 LAB — URINALYSIS, ROUTINE W REFLEX MICROSCOPIC
RBC / HPF: >50 RBC/hpf — ABNORMAL HIGH (ref 0–5)
WBC, UA: >50 WBC/hpf — ABNORMAL HIGH (ref 0–5)

## 2021-06-14 LAB — LACTIC ACID, PLASMA: Lactic Acid, Venous: 1.1 mmol/L (ref 0.5–1.9)

## 2021-06-14 LAB — CBG MONITORING, ED: Glucose-Capillary: 129 mg/dL — ABNORMAL HIGH (ref 70–99)

## 2021-06-14 LAB — HEMOGLOBIN AND HEMATOCRIT, BLOOD
HCT: 36.4 % (ref 36.0–46.0)
Hemoglobin: 12 g/dL (ref 12.0–15.0)

## 2021-06-14 LAB — TROPONIN I (HIGH SENSITIVITY)
Troponin I (High Sensitivity): 4 ng/L (ref <18)
Troponin I (High Sensitivity): <2 ng/L (ref <18)

## 2021-06-14 LAB — LIPASE, BLOOD: Lipase: 35 U/L (ref 11–51)

## 2021-06-14 MED ORDER — CEPHALEXIN 500 MG PO CAPS: 500.0000 mg | ORAL_CAPSULE | Freq: Three times a day (TID) | ORAL | 0 refills | Status: AC

## 2021-06-14 MED ORDER — SODIUM CHLORIDE 0.9 % IV SOLN
1.0000 g | Freq: Once | INTRAVENOUS | Status: AC
  Administered 2021-06-14: 11:00:00 1 g via INTRAVENOUS
  Filled 2021-06-14: qty 10

## 2021-06-14 MED ORDER — SODIUM CHLORIDE 0.9 % IV SOLN: Freq: Once | INTRAVENOUS | Status: AC

## 2021-06-14 MED ORDER — LACTATED RINGERS IV BOLUS
1000.0000 mL | Freq: Once | INTRAVENOUS | Status: AC
  Administered 2021-06-14: 05:00:00 1000 mL via INTRAVENOUS

## 2021-06-14 MED ORDER — ONDANSETRON 4 MG PO TBDP: 4.0000 mg | ORAL_TABLET | Freq: Three times a day (TID) | ORAL | 0 refills | Status: DC | PRN

## 2021-06-14 MED ORDER — IOHEXOL 350 MG/ML SOLN
75.0000 mL | Freq: Once | INTRAVENOUS | Status: AC | PRN
  Administered 2021-06-14: 08:00:00 75 mL via INTRAVENOUS

## 2021-06-14 MED ORDER — FENTANYL CITRATE PF 50 MCG/ML IJ SOSY
50.0000 ug | PREFILLED_SYRINGE | Freq: Once | INTRAMUSCULAR | Status: DC
  Filled 2021-06-14: qty 1

## 2021-06-14 NOTE — ED Provider Notes (Signed)
[facility] EMERGENCY DEPARTMENT Provider Note   CSN: [id number] Arrival date & time: 06/13/21  2248     History Chief Complaint  Patient presents with   Near Syncope    Alicia Parks is a 45 y.o. female.  The history is provided by the patient.  Near Syncope She has history of bipolar disorder, and is status post cholecystectomy on 12/8 and comes into the emergency department following a syncopal episode.  She had slipped this afternoon and hit her lower back, has been having lower back pain since then.  She donated plasma later in the afternoon, and developed some nausea during that procedure.  She was at [facility] later in the evening when she felt lightheaded and nauseated and passed out.  She vomited twice.  She initially had some chest pain which has resolved.  She continues to feel lightheaded.  She denies fever, chills.  At triage she was noted to be hypotensive and had IV fluids ordered.   Past Medical History:  Diagnosis Date   Back pain    Complication of anesthesia    Ear drum perforation    Fibromyalgia    GERD (gastroesophageal reflux disease)    Migraine    Restless leg syndrome     Patient Active Problem List   Diagnosis Date Noted   Symptomatic cholelithiasis 05/30/2021   Cholecystitis 05/30/2021   Dental abscess 10/04/2020   GERD (gastroesophageal reflux disease) 10/04/2020   Generalized anxiety disorder 04/25/2020   Bipolar I disorder, most recent episode depressed (HCC) 04/25/2020   Moderate episode of recurrent major depressive disorder (HCC) 04/25/2020    Past Surgical History:  Procedure Laterality Date   CHOLECYSTECTOMY N/A 05/30/2021   Procedure: LAPAROSCOPIC CHOLECYSTECTOMY WITH ICG DYE;  Surgeon: [name], MD;  Location: [facility] OR;  Service: General;  Laterality: N/A;   TONSILLECTOMY     TUBAL LIGATION       OB History     Gravida  3   Para  3   Term  3   Preterm  0   AB  0   Living  3      SAB  0    IAB  0   Ectopic  0   Multiple  0   Live Births              Family History  Problem Relation Age of Onset   Osteoporosis Mother    Hypertension Father    Heart disease Father    Gout Father    Cancer Other     Social History   Tobacco Use   Smoking status: Never   Smokeless tobacco: Never  Vaping Use   Vaping Use: Never used  Substance Use Topics   Alcohol use: No   Drug use: Yes    Types: Marijuana    Home Medications Prior to Admission medications   Medication Sig Start Date End Date Taking? Authorizing Provider  diphenhydrAMINE (SOMINEX) 25 MG tablet Take 25 mg by mouth daily as needed for allergies.    [provider]  esomeprazole (NEXIUM) 20 MG packet Mix 1 packet (20 mg) with water and drink by mouth in the morning and at bedtime. Patient not taking: Reported on 05/30/2021 03/28/21   Wallis Bamberg, PA-C  famotidine (PEPCID) 40 MG/5ML suspension Take 2.5 mLs (20 mg total) by mouth 2 (two) times daily. Patient taking differently: Take 40 mg by mouth 2 (two) times daily. 03/28/21   Wallis Bamberg, PA-C  hydrOXYzine (ATARAX) 10 MG/5ML syrup Take 12.5 mLs (25 mg total) by mouth 3 (three) times daily as needed for anxiety. Patient not taking: Reported on 04/25/2021 03/25/21   Suella Broad A, PA-C  hydrOXYzine (ATARAX/VISTARIL) 25 MG tablet TAKE 1 TABLET (25 MG TOTAL) BY MOUTH 4 (FOUR) TIMES DAILY AS NEEDED. Patient not taking: Reported on 05/30/2021 02/13/21   Salley Slaughter, NP  ibuprofen (ADVIL) 100 MG/5ML suspension Take 100 mg by mouth every 4 (four) hours as needed for mild pain (headache).    [provider]  lidocaine (XYLOCAINE) 2 % solution Use as directed 15 mLs in the mouth or throat every 6 (six) hours as needed for mouth pain. Patient not taking: Reported on 05/30/2021 03/25/21   Suella Broad A, PA-C  oxyCODONE (OXY IR/ROXICODONE) 5 MG immediate release tablet Take 1 tablet (5 mg total) by mouth every 4 (four) hours as needed for moderate  pain. 05/30/21   Rolm Bookbinder, MD  sucralfate (CARAFATE) 1 GM/10ML suspension Take 10 mLs (1 g total) by mouth 4 (four) times daily -  with meals and at bedtime. Patient not taking: Reported on 04/25/2021 03/25/21   Palumbo, April, MD  ursodiol (ACTIGALL) 300 MG capsule Take 1 capsule by mouth three times daily for 30 days. Patient not taking: Reported on 05/30/2021 04/25/21   Regan Lemming, MD    Allergies    Aspirin, Compazine [prochlorperazine edisylate], Effexor [venlafaxine], and Tylenol [acetaminophen]  Review of Systems   Review of Systems  Cardiovascular:  Positive for near-syncope.  All other systems reviewed and are negative.  Physical Exam Updated Vital Signs BP 98/61    Pulse 61    Temp 98.5 F (36.9 C) (Oral)    Resp 14    Ht 5\' 4"  (1.626 m)    Wt 81.6 kg    LMP 05/15/2021 (Approximate) Comment: neg preg test   SpO2 96%    BMI 30.90 kg/m   Physical Exam Vitals and nursing note reviewed.  45 year old female, resting comfortably and in no acute distress. Vital signs are significant for low blood pressure. Oxygen saturation is 96%, which is normal. Head is normocephalic and atraumatic. PERRLA, EOMI. Oropharynx is clear.  Conjunctivae are pink. Neck is nontender and supple without adenopathy or JVD. Back is nontender and there is no CVA tenderness. Lungs are clear without rales, wheezes, or rhonchi. Chest is nontender. Heart has regular rate and rhythm without murmur. Abdomen is soft, flat, with mild to moderate epigastric tenderness.  There is no rebound or guarding. Extremities have no cyanosis or edema, full range of motion is present. Skin is warm and dry without rash. Neurologic: Mental status is normal, cranial nerves are intact, moves all extremities equally.  ED Results / Procedures / Treatments   Labs (all labs ordered are listed, but only abnormal results are displayed) Labs Reviewed  CBC WITH DIFFERENTIAL/PLATELET - Abnormal; Notable for the following  components:      Result Value   WBC 15.7 (*)    Hemoglobin 15.4 (*)    HCT 46.6 (*)    Platelets 473 (*)    Neutro Abs 10.6 (*)    All other components within normal limits  COMPREHENSIVE METABOLIC PANEL - Abnormal; Notable for the following components:   Glucose, Bld 111 (*)    Calcium 8.6 (*)    Total Protein 5.9 (*)    Albumin 3.4 (*)    All other components within normal limits  URINALYSIS, ROUTINE W REFLEX MICROSCOPIC -  Abnormal; Notable for the following components:   Color, Urine RED (*)    APPearance TURBID (*)    Glucose, UA   (*)    Value: TEST NOT REPORTED DUE TO COLOR INTERFERENCE OF URINE PIGMENT   Hgb urine dipstick   (*)    Value: TEST NOT REPORTED DUE TO COLOR INTERFERENCE OF URINE PIGMENT   Bilirubin Urine   (*)    Value: TEST NOT REPORTED DUE TO COLOR INTERFERENCE OF URINE PIGMENT   Ketones, ur   (*)    Value: TEST NOT REPORTED DUE TO COLOR INTERFERENCE OF URINE PIGMENT   Protein, ur   (*)    Value: TEST NOT REPORTED DUE TO COLOR INTERFERENCE OF URINE PIGMENT   Nitrite   (*)    Value: TEST NOT REPORTED DUE TO COLOR INTERFERENCE OF URINE PIGMENT   Leukocytes,Ua   (*)    Value: TEST NOT REPORTED DUE TO COLOR INTERFERENCE OF URINE PIGMENT   RBC / HPF >50 (*)    WBC, UA >50 (*)    Bacteria, UA MANY (*)    All other components within normal limits  CBG MONITORING, ED - Abnormal; Notable for the following components:   Glucose-Capillary 129 (*)    All other components within normal limits  LIPASE, BLOOD  LACTIC ACID, PLASMA  HEMOGLOBIN AND HEMATOCRIT, BLOOD  I-STAT BETA HCG BLOOD, ED ([facility], [facility], [facility] ONLY)  CBG MONITORING, ED  TROPONIN I (HIGH SENSITIVITY)  TROPONIN I (HIGH SENSITIVITY)    EKG EKG Interpretation  Date/Time:  Friday June 14 2021 00:02:34 EST Ventricular Rate:  69 PR Interval:  124 QRS Duration: 86 QT Interval:  404 QTC Calculation: 432 R Axis:   67 Text Interpretation: Normal sinus rhythm Cannot rule out Anterior infarct , age  undetermined Abnormal ECG When compared with ECG of 04/30/2021, No significant change was found Confirmed by Delora Fuel ([id number]) on 06/14/2021 4:24:48 AM  Radiology DG Lumbar Spine Complete  Result Date: 06/14/2021 CLINICAL DATA:  Fall, low back pain. EXAM: LUMBAR SPINE - COMPLETE 4+ VIEW COMPARISON:  01/17/2013. FINDINGS: There is no evidence of lumbar spine fracture. Alignment is normal. Intervertebral disc spaces are maintained. Mild degenerative endplate changes are noted at L4 and L5. IMPRESSION: No acute fracture. Electronically Signed   By: Brett Fairy M.D.   On: 06/14/2021 01:30    Procedures Procedures  CRITICAL CARE Performed by: Delora Fuel Total critical care time: 50 minutes Critical care time was exclusive of separately billable procedures and treating other patients. Critical care was necessary to treat or prevent imminent or life-threatening deterioration. Critical care was time spent personally by me on the following activities: development of treatment plan with patient and/or surrogate as well as nursing, discussions with consultants, evaluation of patient's response to treatment, examination of patient, obtaining history from patient or surrogate, ordering and performing treatments and interventions, ordering and review of laboratory studies, ordering and review of radiographic studies, pulse oximetry and re-evaluation of patient's condition.  Medications Ordered in ED Medications  sodium chloride 0.9 % bolus 1,000 mL (1,000 mLs Intravenous New Bag/Given 06/13/21 2330)    And  0.9 %  sodium chloride infusion ( Intravenous New Bag/Given 06/14/21 0208)  lactated ringers bolus 1,000 mL (has no administration in time range)  fentaNYL (SUBLIMAZE) injection 50 mcg (has no administration in time range)    ED Course  I have reviewed the triage vital signs and the nursing notes.  Pertinent labs & imaging results that were available  during my care of the patient were reviewed  by me and considered in my medical decision making (see chart for details).    MDM Rules/Calculators/A&P                         Syncope with hypotension.  Because of the hypotension is not clear.  ECG shows no acute process.  Lumbar spine x-rays are negative for fracture.  Labs do show leukocytosis with hemoglobin increased compared with baseline.  Also, thrombocytosis is noted which is nonspecific.  Blood pressure continues to be low in the emergency department in spite of IV fluids.  She will be given additional IV fluids and will recheck hemoglobin to see if it is dropping.  Will send for CT of abdomen and pelvis.  Old records are reviewed confirming recent hospitalization for cholecystectomy.  Blood pressure has stabilized with additional IV fluid.  Lactic acid level is normal.  CT is pending.  Case is signed out to Dr. Billy Fischer.  Final Clinical Impression(s) / ED Diagnoses Final diagnoses:  None    Rx / DC Orders ED Discharge Orders     None        Delora Fuel, [place] [id number] [phone]

## 2021-06-14 NOTE — ED Notes (Signed)
Pt ambulated back to Resus room, hypotensive while sitting. Complaining of chest tightness that radiates to back. Adjusted to supine position, and chest pain has subsided, BP normotensive. 1L NS had finished before being roomed

## 2021-06-14 NOTE — ED Provider Notes (Signed)
°  Physical Exam  BP 101/65 (BP Location: Right Arm)    Pulse 72    Temp 97.8 F (36.6 C) (Oral)    Resp 14    Ht 5\' 4"  (1.626 m)    Wt 81.6 kg    LMP 05/15/2021 (Approximate) Comment: neg preg test   SpO2 98%    BMI 30.90 kg/m   Physical Exam  ED Course/Procedures     Procedures  MDM  Received care of patient from Dr. 05/17/2021.  Please see his note for prior history, physical and care.  Briefly this is a 45 year old female who is status postcholecystectomy on 12/8 and came in with a syncopal episode and hypotension down to the 80s and 70s.  Blood pressures improving with hydration, CT pending to evaluate for signs of postsurgical complications.  On my evaluation, she reports both chest pain and back pain.  Given hypotension, chest and back pain, ordered CTA dissection study to include chest abdomen pelvis.  CTA was completed which showed no evidence of acute abnormalities in the chest, abdomen or pelvis, including no signs of other surgical pathology.  Her urinalysis returned concerning for urinary tract infection, and she does report urinary symptoms since not long after her surgery.  She had a leukocytosis today, but normal lactic acid.  Her blood pressures have improved, and heart rate is normal.    Suspect her syncopal episode and hypotension is multifactorial, with dehydration given recent surgery, plasma donation today, and infection all playing a role.  Given Rocephin and additional 1 L of fluid.  Feel she is appropriate for continued outpatient follow-up, antibiotics, and strict return precautions.       14/8, MD 06/14/21 1517

## 2021-06-18 ENCOUNTER — Ambulatory Visit (HOSPITAL_COMMUNITY)
Admission: EM | Admit: 2021-06-18 | Discharge: 2021-06-18 | Disposition: A | Discharge status: Home / Self Care | Payer: BLUE CROSS/BLUE SHIELD | Attending: Physician Assistant | Admitting: Physician Assistant

## 2021-06-18 ENCOUNTER — Encounter (HOSPITAL_COMMUNITY): Payer: Self-pay | Admitting: Emergency Medicine

## 2021-06-18 DIAGNOSIS — R051 Acute cough: Secondary | ICD-10-CM

## 2021-06-18 DIAGNOSIS — J029 Acute pharyngitis, unspecified: Secondary | ICD-10-CM

## 2021-06-18 DIAGNOSIS — J069 Acute upper respiratory infection, unspecified: Secondary | ICD-10-CM | POA: Diagnosis not present

## 2021-06-18 MED ORDER — LIDOCAINE VISCOUS HCL 2 % MT SOLN: 10.0000 mL | Freq: Three times a day (TID) | OROMUCOSAL | 0 refills | Status: DC | PRN

## 2021-06-18 MED ORDER — PREDNISONE 20 MG PO TABS: 40.0000 mg | ORAL_TABLET | Freq: Every day | ORAL | 0 refills | Status: DC

## 2021-06-18 MED ORDER — BENZONATATE 200 MG PO CAPS: 200.0000 mg | ORAL_CAPSULE | Freq: Three times a day (TID) | ORAL | 0 refills | Status: DC | PRN

## 2021-06-18 NOTE — Discharge Instructions (Signed)
I have called in 3 medicines to help with your symptoms.  Please start prednisone 40 mg for 4 days to help with inflammation.  Do not take NSAIDs including aspirin, ibuprofen/Advil, naproxen/Aleve with this medication as they can cause stomach bleeding.  I have called in viscous lidocaine which you can gargle with up to 3 times a day for sore throat symptoms.  Use Tessalon up to 3 times a day for cough.  You can use Mucinex and Flonase for additional symptom relief.  Make sure you rest and drink plenty of fluid.  If your symptoms have not improved within a week please return for reevaluation.  If you develop any worsening symptoms including chest pain, shortness of breath, nausea/vomiting interfering with oral intake, fever not responding to medication, weakness you need to go to the emergency room.  Complete antibiotics as previously prescribed.

## 2021-06-18 NOTE — ED Triage Notes (Signed)
Has cough and sore throat x 4 days. Had negative covid and flu yesterday. Sent in steroid pack and two antibiotics yesterday, one antibiotics for UTI.

## 2021-06-18 NOTE — ED Provider Notes (Signed)
[place]    CSN: [id number] Arrival date & time: 06/18/21  1724      History   Chief Complaint Chief Complaint  Patient presents with   Cough   Sore Throat    HPI Alicia Parks is a 45 y.o. female.   Patient presents today with a 4-day history of URI.  Reports cough, sore throat, fatigue, malaise.  Denies any chest pain, shortness of breath, nausea, vomiting, diarrhea.  She was seen at her PCP [facility] medical) yesterday at which point she tested negative for flu and COVID.  She was given azithromycin which has not provided any relief.  She is currently taking cephalexin for UTI after having hypotension resulting in EMS taking her to the emergency room on 06/13/2021.  She denies any history of allergies, asthma, COPD.  Reports she is having difficulty sleeping at night as result of symptoms and is requesting medication for symptomatic management.  She denies any known sick contacts.  She is having difficulty with activities as result of symptoms.   Past Medical History:  Diagnosis Date   Back pain    Complication of anesthesia    Ear drum perforation    Fibromyalgia    GERD (gastroesophageal reflux disease)    Migraine    Restless leg syndrome     Patient Active Problem List   Diagnosis Date Noted   Symptomatic cholelithiasis 05/30/2021   Cholecystitis 05/30/2021   Dental abscess 10/04/2020   GERD (gastroesophageal reflux disease) 10/04/2020   Generalized anxiety disorder 04/25/2020   Bipolar I disorder, most recent episode depressed ([place]) 04/25/2020   Moderate episode of recurrent major depressive disorder ([place]) 04/25/2020    Past Surgical History:  Procedure Laterality Date   CHOLECYSTECTOMY N/A 05/30/2021   Procedure: LAPAROSCOPIC CHOLECYSTECTOMY WITH ICG DYE;  Surgeon: Rolm Bookbinder, MD;  Location: [place];  Service: General;  Laterality: N/A;   TONSILLECTOMY     TUBAL LIGATION      OB History     Gravida  3   Para  3   Term  3    Preterm  0   AB  0   Living  3      SAB  0   IAB  0   Ectopic  0   Multiple  0   Live Births               Home Medications    Prior to Admission medications   Medication Sig Start Date End Date Taking? Authorizing Provider  benzonatate (TESSALON) 200 MG capsule Take 1 capsule (200 mg total) by mouth 3 (three) times daily as needed for cough. 06/18/21  Yes Emelie Newsom K, PA-C  lidocaine (XYLOCAINE) 2 % solution Use as directed 10 mLs in the mouth or throat every 8 (eight) hours as needed for mouth pain. 06/18/21  Yes Brenten Janney K, PA-C  predniSONE (DELTASONE) 20 MG tablet Take 2 tablets (40 mg total) by mouth daily for 4 days. 06/18/21 06/22/21 Yes Yamilette Garretson K, PA-C  cephALEXin (KEFLEX) 500 MG capsule Take 1 capsule (500 mg total) by mouth 3 (three) times daily for 14 days. 06/14/21 06/28/21  Gareth Morgan, MD  diphenhydrAMINE (SOMINEX) 25 MG tablet Take 25 mg by mouth daily as needed for allergies.    [provider]  esomeprazole (NEXIUM) 20 MG packet Mix 1 packet (20 mg) with water and drink by mouth in the morning and at bedtime. Patient not taking: Reported on 05/30/2021 03/28/21  Wallis Bamberg, PA-C  famotidine (PEPCID) 40 MG/5ML suspension Take 2.5 mLs (20 mg total) by mouth 2 (two) times daily. Patient taking differently: Take 40 mg by mouth 2 (two) times daily. 03/28/21   Wallis Bamberg, PA-C  hydrOXYzine (ATARAX) 10 MG/5ML syrup Take 12.5 mLs (25 mg total) by mouth 3 (three) times daily as needed for anxiety. Patient not taking: Reported on 04/25/2021 03/25/21   Army Melia A, PA-C  hydrOXYzine (ATARAX/VISTARIL) 25 MG tablet TAKE 1 TABLET (25 MG TOTAL) BY MOUTH 4 (FOUR) TIMES DAILY AS NEEDED. Patient not taking: Reported on 05/30/2021 02/13/21   Shanna Cisco, NP  ondansetron (ZOFRAN-ODT) 4 MG disintegrating tablet Take 1 tablet (4 mg total) by mouth every 8 (eight) hours as needed for nausea or vomiting. 06/14/21   Alvira Monday, MD  oxyCODONE (OXY  IR/ROXICODONE) 5 MG immediate release tablet Take 1 tablet (5 mg total) by mouth every 4 (four) hours as needed for moderate pain. 05/30/21   [name], MD  sucralfate (CARAFATE) 1 GM/10ML suspension Take 10 mLs (1 g total) by mouth 4 (four) times daily -  with meals and at bedtime. Patient not taking: Reported on 04/25/2021 03/25/21   Palumbo, April, MD  ursodiol (ACTIGALL) 300 MG capsule Take 1 capsule by mouth three times daily for 30 days. Patient not taking: Reported on 05/30/2021 04/25/21   Ernie Avena, MD    Family History Family History  Problem Relation Age of Onset   Osteoporosis Mother    Hypertension Father    Heart disease Father    Gout Father    Cancer Other     Social History Social History   Tobacco Use   Smoking status: Never   Smokeless tobacco: Never  Vaping Use   Vaping Use: Never used  Substance Use Topics   Alcohol use: No   Drug use: Yes    Types: Marijuana     Allergies   Aspirin, Compazine [prochlorperazine edisylate], Effexor [venlafaxine], and Tylenol [acetaminophen]   Review of Systems Review of Systems  Constitutional:  Positive for activity change and fatigue. Negative for appetite change and fever.  HENT:  Positive for congestion and sore throat. Negative for sinus pressure and sneezing.   Respiratory:  Positive for cough. Negative for shortness of breath.   Cardiovascular:  Negative for chest pain.  Gastrointestinal:  Negative for abdominal pain, diarrhea, nausea and vomiting.  Musculoskeletal:  Negative for arthralgias and myalgias.  Neurological:  Negative for dizziness, light-headedness and headaches.    Physical Exam Triage Vital Signs ED Triage Vitals  Enc Vitals Group     BP 06/18/21 1836 127/85     Pulse Rate 06/18/21 1836 78     Resp 06/18/21 1836 17     Temp 06/18/21 1836 98.4 F (36.9 C)     Temp Source 06/18/21 1836 Oral     SpO2 06/18/21 1836 99 %     Weight --      Height --      Head Circumference --       Peak Flow --      Pain Score 06/18/21 1834 8     Pain Loc --      Pain Edu? --      Excl. in GC? --    No data found.  Updated Vital Signs BP 127/85 (BP Location: Left Arm)    Pulse 78    Temp 98.4 F (36.9 C) (Oral)    Resp 17    LMP 06/11/2021  SpO2 99%   Visual Acuity Right Eye Distance:   Left Eye Distance:   Bilateral Distance:    Right Eye Near:   Left Eye Near:    Bilateral Near:     Physical Exam Vitals reviewed.  Constitutional:      General: She is awake. She is not in acute distress.    Appearance: Normal appearance. She is well-developed. She is not ill-appearing.     Comments: Very pleasant female appears stated age in no acute distress sitting comfortably in exam room  HENT:     Head: Normocephalic and atraumatic.     Right Ear: Tympanic membrane, ear canal and external ear normal. Tympanic membrane is not erythematous or bulging.     Left Ear: Tympanic membrane, ear canal and external ear normal. Tympanic membrane is not erythematous or bulging.     Nose:     Right Sinus: No maxillary sinus tenderness or frontal sinus tenderness.     Left Sinus: No maxillary sinus tenderness or frontal sinus tenderness.     Mouth/Throat:     Pharynx: Uvula midline. Posterior oropharyngeal erythema present. No oropharyngeal exudate.  Cardiovascular:     Rate and Rhythm: Normal rate and regular rhythm.     Heart sounds: Normal heart sounds, S1 normal and S2 normal. No murmur heard. Pulmonary:     Effort: Pulmonary effort is normal.     Breath sounds: Normal breath sounds. No wheezing, rhonchi or rales.     Comments: Clear to auscultation bilaterally Psychiatric:        Behavior: Behavior is cooperative.     UC Treatments / Results  Labs (all labs ordered are listed, but only abnormal results are displayed) Labs Reviewed - No data to display  EKG   Radiology No results found.  Procedures Procedures (including critical care time)  Medications Ordered in  UC Medications - No data to display  Initial Impression / Assessment and Plan / UC Course  I have reviewed the triage vital signs and the nursing notes.  Pertinent labs & imaging results that were available during my care of the patient were reviewed by me and considered in my medical decision making (see chart for details).     Suspect viral etiology given short duration of symptoms.  Patient has already had negative viral testing so this was not repeated today.  We will treat symptomatically and patient was given Tessalon for cough, viscous lidocaine for sore throat.  Recommend she use over-the-counter medications including Mucinex and Flonase.  She was given prednisone burst to help manage symptoms with instruction not to take NSAIDs including aspirin, ibuprofen/Advil, naproxen/Aleve due to risk of GI bleeding.  She is to rest and drink plenty of fluid.  Recommended using a humidifier at night.  Discussed that if symptoms persist for another week she should return or see your PCP for reevaluation.  If she has any worsening symptoms including chest pain, shortness of breath, high fever, nausea/vomiting interfering with oral intake, weakness she needs to go to the emergency room.  Strict return precautions given to which she expressed understanding.  Final Clinical Impressions(s) / UC Diagnoses   Final diagnoses:  Upper respiratory tract infection, unspecified type  Acute cough  Sore throat     Discharge Instructions      I have called in 3 medicines to help with your symptoms.  Please start prednisone 40 mg for 4 days to help with inflammation.  Do not take NSAIDs including aspirin, ibuprofen/Advil, naproxen/Aleve  with this medication as they can cause stomach bleeding.  I have called in viscous lidocaine which you can gargle with up to 3 times a day for sore throat symptoms.  Use Tessalon up to 3 times a day for cough.  You can use Mucinex and Flonase for additional symptom relief.  Make  sure you rest and drink plenty of fluid.  If your symptoms have not improved within a week please return for reevaluation.  If you develop any worsening symptoms including chest pain, shortness of breath, nausea/vomiting interfering with oral intake, fever not responding to medication, weakness you need to go to the emergency room.  Complete antibiotics as previously prescribed.     ED Prescriptions     Medication Sig Dispense Auth. Provider   lidocaine (XYLOCAINE) 2 % solution Use as directed 10 mLs in the mouth or throat every 8 (eight) hours as needed for mouth pain. 100 mL Keiondre Colee K, PA-C   benzonatate (TESSALON) 200 MG capsule Take 1 capsule (200 mg total) by mouth 3 (three) times daily as needed for cough. 20 capsule Staci Dack K, PA-C   predniSONE (DELTASONE) 20 MG tablet Take 2 tablets (40 mg total) by mouth daily for 4 days. 8 tablet Teressa Mcglocklin, Derry Skill, PA-C      PDMP not reviewed this encounter.   Terrilee Croak, PA-C 06/18/21 1901

## 2021-06-19 ENCOUNTER — Other Ambulatory Visit: Payer: Self-pay

## 2021-06-20 ENCOUNTER — Encounter (HOSPITAL_COMMUNITY): Payer: Self-pay | Admitting: Emergency Medicine

## 2021-06-20 ENCOUNTER — Telehealth (HOSPITAL_COMMUNITY): Payer: Self-pay | Admitting: Internal Medicine

## 2021-06-20 ENCOUNTER — Telehealth (HOSPITAL_COMMUNITY): Payer: Self-pay | Admitting: *Deleted

## 2021-06-20 ENCOUNTER — Ambulatory Visit (HOSPITAL_COMMUNITY)
Admission: EM | Admit: 2021-06-20 | Discharge: 2021-06-20 | Disposition: A | Discharge status: Home / Self Care | Payer: BLUE CROSS/BLUE SHIELD

## 2021-06-20 ENCOUNTER — Other Ambulatory Visit: Payer: Self-pay

## 2021-06-20 ENCOUNTER — Emergency Department (HOSPITAL_COMMUNITY)
Admission: EM | Admit: 2021-06-20 | Discharge: 2021-06-20 | Disposition: A | Discharge status: Home / Self Care | Payer: BLUE CROSS/BLUE SHIELD | Attending: Emergency Medicine | Admitting: Emergency Medicine

## 2021-06-20 DIAGNOSIS — Z5321 Procedure and treatment not carried out due to patient leaving prior to being seen by health care provider: Secondary | ICD-10-CM | POA: Diagnosis not present

## 2021-06-20 DIAGNOSIS — U071 COVID-19: Secondary | ICD-10-CM | POA: Insufficient documentation

## 2021-06-20 DIAGNOSIS — T50905A Adverse effect of unspecified drugs, medicaments and biological substances, initial encounter: Secondary | ICD-10-CM | POA: Diagnosis not present

## 2021-06-20 MED ORDER — PROMETHAZINE-DM 6.25-15 MG/5ML PO SYRP: 5.0000 mL | ORAL_SOLUTION | Freq: Four times a day (QID) | ORAL | 0 refills | Status: DC | PRN

## 2021-06-20 NOTE — ED Triage Notes (Signed)
Pt reports URI symptoms 1 week ago.  States she was seen at [facility] on Monday and tested negative for COVID and started on Z-pack.  States she received email from [facility] regarding + COVID results.  Denies any symptoms at present.

## 2021-06-20 NOTE — Discharge Instructions (Addendum)
-  With a virus, you're typically contagious for 5-7 days, or as long as you're having fevers, including covid.  -Follow-up if symptoms worsen, like shortness of breath, chest pain, dizziness, new fevers/chills.

## 2021-06-20 NOTE — Telephone Encounter (Signed)
Medication sent to the pharmacy on 06/20/2021

## 2021-06-20 NOTE — ED Provider Notes (Signed)
[facility]    CSN: [id number] Arrival date & time: 06/20/21  2993      History   Chief Complaint Chief Complaint  Patient presents with   Cough    HPI Alicia Parks is a 45 y.o. female presenting with continued cough.  Medical history noncontributory as below, she does not have a history of pulmonary disease.  She was last at our urgent care on 12/27, per their note "Patient presents today with a 4-day history of URI.  Reports cough, sore throat, fatigue, malaise. ... She was seen at her PCP [facility] medical) yesterday at which point she tested negative for flu and COVID.  She was given azithromycin which has not provided any relief.  She is currently taking cephalexin for UTI after having hypotension resulting in EMS taking her to the emergency room on 06/13/2021.  She denies any history of allergies, asthma, COPD.  ...  She denies any known sick contacts".  Patient presents today with 6 days of cough, states that the [facility] are giving her headaches and diarrhea. During visit, she actually received a call that her Covid PCR test was actually positive. At this point, her symptoms are getting better. Denies SOB, CP, dizziness, fevers/chills. Cough is nonproductive.  She attempted the prednisone and Tessalon that was sent at her last visit, states that she had heart palpitations in reaction to the prednisone, and diarrhea in reaction to the Tessalon, so she stopped both at home.  She is also allergic to Tylenol, NyQuil, Promethazine DM, and is requesting codeine cough syrup.  HPI  Past Medical History:  Diagnosis Date   Back pain    Complication of anesthesia    Ear drum perforation    Fibromyalgia    GERD (gastroesophageal reflux disease)    Migraine    Restless leg syndrome     Patient Active Problem List   Diagnosis Date Noted   Symptomatic cholelithiasis 05/30/2021   Cholecystitis 05/30/2021   Dental abscess 10/04/2020   GERD (gastroesophageal reflux  disease) 10/04/2020   Generalized anxiety disorder 04/25/2020   Bipolar I disorder, most recent episode depressed ([facility]) 04/25/2020   Moderate episode of recurrent major depressive disorder ([facility]) 04/25/2020    Past Surgical History:  Procedure Laterality Date   CHOLECYSTECTOMY N/A 05/30/2021   Procedure: LAPAROSCOPIC CHOLECYSTECTOMY WITH ICG DYE;  Surgeon: [name], MD;  Location[facility];  Service: General;  Laterality: N/A;   TONSILLECTOMY     TUBAL LIGATION      OB History     Gravida  3   Para  3   Term  3   Preterm  0   AB  0   Living  3      SAB  0   IAB  0   Ectopic  0   Multiple  0   Live Births               Home Medications    Prior to Admission medications   Medication Sig Start Date End Date Taking? Authorizing Provider  cephALEXin (KEFLEX) 500 MG capsule Take 1 capsule (500 mg total) by mouth 3 (three) times daily for 14 days. 06/14/21 06/28/21  Alvira Monday, MD  diphenhydrAMINE (SOMINEX) 25 MG tablet Take 25 mg by mouth daily as needed for allergies.    [provider]  esomeprazole (NEXIUM) 20 MG packet Mix 1 packet (20 mg) with water and drink by mouth in the morning and at bedtime. Patient not  taking: Reported on 05/30/2021 03/28/21   Wallis Bamberg, PA-C  famotidine (PEPCID) 40 MG/5ML suspension Take 2.5 mLs (20 mg total) by mouth 2 (two) times daily. Patient taking differently: Take 40 mg by mouth 2 (two) times daily. 03/28/21   Wallis Bamberg, PA-C  hydrOXYzine (ATARAX) 10 MG/5ML syrup Take 12.5 mLs (25 mg total) by mouth 3 (three) times daily as needed for anxiety. Patient not taking: Reported on 04/25/2021 03/25/21   Army Melia A, PA-C  hydrOXYzine (ATARAX/VISTARIL) 25 MG tablet TAKE 1 TABLET (25 MG TOTAL) BY MOUTH 4 (FOUR) TIMES DAILY AS NEEDED. Patient not taking: Reported on 05/30/2021 02/13/21   Shanna Cisco, NP  lidocaine (XYLOCAINE) 2 % solution Use as directed 10 mLs in the mouth or throat every 8 (eight) hours as  needed for mouth pain. 06/18/21   Raspet, Erin K, PA-C  ondansetron (ZOFRAN-ODT) 4 MG disintegrating tablet Take 1 tablet (4 mg total) by mouth every 8 (eight) hours as needed for nausea or vomiting. 06/14/21   Alvira Monday, MD  oxyCODONE (OXY IR/ROXICODONE) 5 MG immediate release tablet Take 1 tablet (5 mg total) by mouth every 4 (four) hours as needed for moderate pain. 05/30/21   [name], MD  sucralfate (CARAFATE) 1 GM/10ML suspension Take 10 mLs (1 g total) by mouth 4 (four) times daily -  with meals and at bedtime. Patient not taking: Reported on 04/25/2021 03/25/21   Palumbo, April, MD  ursodiol (ACTIGALL) 300 MG capsule Take 1 capsule by mouth three times daily for 30 days. Patient not taking: Reported on 05/30/2021 04/25/21   Ernie Avena, MD    Family History Family History  Problem Relation Age of Onset   Osteoporosis Mother    Hypertension Father    Heart disease Father    Gout Father    Cancer Other     Social History Social History   Tobacco Use   Smoking status: Never   Smokeless tobacco: Never  Vaping Use   Vaping Use: Never used  Substance Use Topics   Alcohol use: No   Drug use: Yes    Types: Marijuana     Allergies   Aspirin, Compazine [prochlorperazine edisylate], Effexor [venlafaxine], and Tylenol [acetaminophen]   Review of Systems Review of Systems  Constitutional:  Negative for appetite change, chills and fever.  HENT:  Positive for congestion. Negative for ear pain, rhinorrhea, sinus pressure, sinus pain and sore throat.   Eyes:  Negative for redness and visual disturbance.  Respiratory:  Positive for cough. Negative for chest tightness, shortness of breath and wheezing.   Cardiovascular:  Negative for chest pain and palpitations.  Gastrointestinal:  Positive for diarrhea. Negative for abdominal pain, constipation, nausea and vomiting.  Genitourinary:  Negative for dysuria, frequency and urgency.  Musculoskeletal:  Negative for  myalgias.  Neurological:  Negative for dizziness, weakness and headaches.  Psychiatric/Behavioral:  Negative for confusion.   All other systems reviewed and are negative.   Physical Exam Triage Vital Signs ED Triage Vitals [06/20/21 0836]  Enc Vitals Group     BP (!) 150/97     Pulse Rate 78     Resp 18     Temp 98.2 F (36.8 C)     Temp Source Oral     SpO2 98 %     Weight      Height      Head Circumference      Peak Flow      Pain Score 5  Pain Loc      Pain Edu?      Excl. in GC?    No data found.  Updated Vital Signs BP (!) 150/97    Pulse 78    Temp 98.2 F (36.8 C) (Oral)    Resp 18    LMP 06/11/2021    SpO2 98%   Visual Acuity Right Eye Distance:   Left Eye Distance:   Bilateral Distance:    Right Eye Near:   Left Eye Near:    Bilateral Near:     Physical Exam Vitals reviewed.  Constitutional:      General: She is not in acute distress.    Appearance: Normal appearance. She is not ill-appearing.  HENT:     Head: Normocephalic and atraumatic.     Right Ear: Tympanic membrane, ear canal and external ear normal. No tenderness. No middle ear effusion. There is no impacted cerumen. Tympanic membrane is not perforated, erythematous, retracted or bulging.     Left Ear: Tympanic membrane, ear canal and external ear normal. No tenderness.  No middle ear effusion. There is no impacted cerumen. Tympanic membrane is not perforated, erythematous, retracted or bulging.     Nose: Nose normal. No congestion.     Mouth/Throat:     Mouth: Mucous membranes are moist.     Pharynx: Uvula midline. No oropharyngeal exudate or posterior oropharyngeal erythema.  Eyes:     Extraocular Movements: Extraocular movements intact.     Pupils: Pupils are equal, round, and reactive to light.  Cardiovascular:     Rate and Rhythm: Normal rate and regular rhythm.     Heart sounds: Normal heart sounds.  Pulmonary:     Effort: Pulmonary effort is normal.     Breath sounds: Normal  breath sounds. No decreased breath sounds, wheezing, rhonchi or rales.  Abdominal:     Palpations: Abdomen is soft.     Tenderness: There is no abdominal tenderness. There is no guarding or rebound.  Lymphadenopathy:     Cervical: No cervical adenopathy.     Right cervical: No superficial cervical adenopathy.    Left cervical: No superficial cervical adenopathy.  Neurological:     General: No focal deficit present.     Mental Status: She is alert and oriented to person, place, and time.  Psychiatric:        Mood and Affect: Mood normal.        Behavior: Behavior normal.        Thought Content: Thought content normal.        Judgment: Judgment normal.     UC Treatments / Results  Labs (all labs ordered are listed, but only abnormal results are displayed) Labs Reviewed - No data to display  EKG   Radiology No results found.  Procedures Procedures (including critical care time)  Medications Ordered in UC Medications - No data to display  Initial Impression / Assessment and Plan / UC Course  I have reviewed the triage vital signs and the nursing notes.  Pertinent labs & imaging results that were available during my care of the patient were reviewed by me and considered in my medical decision making (see chart for details).     This patient is a very pleasant 45 y.o. year old female presenting with Covid-19- resolving on its own. Today this pt is afebrile nontachycardic nontachypneic, oxygenating well on room air, no wheezes rhonchi or rales. Does not have history of pulm ds.  Positive COVID PCR at  primary care about 6 days ago.  Has already completed azithromycin as prescribed by PCP without resolution. Was also prescribed Keflex at ED 12/22 for UTI, these symptoms have resolved.  Was prescribed Tessalon and prednisone after her visit with [place] 12/27, states she did not tolerate either of these and so stopped at home.  She is also allergic to DayQuil, NyQuil, Promethazine DM,  albuterol per patient.  Symptoms are resolving on their own, and she is clinically well-appearing today.  Will discharge to home on current regimen with over-the-counter medications only.  Follow-up with primary care for recheck given many concerns. ED return precautions discussed. Patient verbalizes understanding and agreement.    Final Clinical Impressions(s) / UC Diagnoses   Final diagnoses:  COVID-19  Adverse effect of drug, initial encounter     Discharge Instructions      -With a virus, you're typically contagious for 5-7 days, or as long as you're having fevers, including covid.  -Follow-up if symptoms worsen, like shortness of breath, chest pain, dizziness, new fevers/chills.      ED Prescriptions   None    PDMP not reviewed this encounter.   Rhys Martini, PA-C 06/20/21 [phone]

## 2021-06-20 NOTE — Telephone Encounter (Signed)
VM from patient stating she has had her gall bladder surgery and feeling better from that and now that its behind her she would like to speak with Dr Doyne Keel re medicine for depression and anxiety. She has a future appt on 08/14/21. Will notify Dr Doyne Keel she would like to speak with her.

## 2021-06-20 NOTE — ED Triage Notes (Signed)
Pt is present today with concerns with a medication that was recently prescribed at her last visit. Pt states that she has diarrhea and HA from taking tessalon pearls.

## 2021-06-20 NOTE — Telephone Encounter (Signed)
Opened chart a second time in error.  

## 2021-06-21 ENCOUNTER — Other Ambulatory Visit (HOSPITAL_COMMUNITY): Payer: Self-pay | Admitting: Psychiatry

## 2021-06-21 DIAGNOSIS — F411 Generalized anxiety disorder: Secondary | ICD-10-CM

## 2021-06-21 MED ORDER — SERTRALINE HCL 25 MG PO TABS: 25.0000 mg | ORAL_TABLET | Freq: Every day | ORAL | 3 refills | Status: DC

## 2021-06-21 NOTE — Telephone Encounter (Signed)
Patient notes that she has been more anxious and depressed. She reports that Klonopin was most effective in the past. Provider informed patient that Klonopin would not be filled at this time. She endorsed understanding and agreed. Patient was agreeable to start Zoloft 25 mg to help manage anxiety and depression. Potential side effects of medication and risks vs benefits of treatment vs non-treatment were explained and discussed. All questions were answered. Patient notes at times she is irritable but relates it to life stressors (death of her father and cousin, finances, living in a hotel, and having Covid 19). Today she reports that she has had passive SI but denies wanting to harm herself. She denies SI/HI/VAH, mania, or paranoia. No other concerns noted at this time.

## 2021-06-29 NOTE — Discharge Summary (Signed)
Physician Discharge Summary  Patient ID: Alicia Parks MRN: [id number] DOB/AGE: 46-31-1977 46 y.o.  Admit date: 05/30/2021 Discharge date: 06/29/2021  Admission Diagnoses: Cholecystitis  Discharge Diagnoses:  Principal Problem:   Symptomatic cholelithiasis Active Problems:   Cholecystitis   Discharged Condition: good  Hospital Course: 54 yof underwent lap chole for cholecystitis. This was done as outpatient from er.  Consults: None  Significant Diagnostic Studies: none  Treatments: surgery: lap chole   Disposition: Discharge disposition: 01-Home or Self Care        Allergies as of 05/30/2021       Reactions   Aspirin Other (See Comments)   "gives me the jitters"   Compazine [prochlorperazine Edisylate] Other (See Comments)   Makes body move funny. Shaking   Effexor [venlafaxine]    Nightmares and night sweats   Tylenol [acetaminophen] Nausea Only, Rash        Medication List     TAKE these medications    diphenhydrAMINE 25 MG tablet Commonly known as: SOMINEX Take 25 mg by mouth daily as needed for allergies.   esomeprazole 20 MG packet Commonly known as: NEXIUM Mix 1 packet (20 mg) with water and drink by mouth in the morning and at bedtime.   famotidine 40 MG/5ML suspension Commonly known as: PEPCID Take 2.5 mLs (20 mg total) by mouth 2 (two) times daily. What changed: how much to take   hydrOXYzine 25 MG tablet Commonly known as: ATARAX TAKE 1 TABLET (25 MG TOTAL) BY MOUTH 4 (FOUR) TIMES DAILY AS NEEDED.   hydrOXYzine 10 MG/5ML syrup Commonly known as: ATARAX Take 12.5 mLs (25 mg total) by mouth 3 (three) times daily as needed for anxiety.   oxyCODONE 5 MG immediate release tablet Commonly known as: Oxy IR/ROXICODONE Take 1 tablet (5 mg total) by mouth every 4 (four) hours as needed for moderate pain.   sucralfate 1 GM/10ML suspension Commonly known as: Carafate Take 10 mLs (1 g total) by mouth 4 (four) times daily -  with meals and  at bedtime.   ursodiol 300 MG capsule Commonly known as: ACTIGALL Take 1 capsule by mouth three times daily for 30 days.        Follow-up Information     [facility], [place]. Schedule an appointment as soon as possible for a visit in 3 week(s).   Specialty: General Surgery Why: Our office is working on scheduling a follow up appointment in about 3 weeks. Please call to confirm appointment date/time. Bring photo ID and insurance information. Please arrive 30 min prior to appointment time to check in. Contact information: [place] [phone]                 Signed: Rolm Bookbinder 06/29/2021, 12:17 PM

## 2021-07-08 ENCOUNTER — Other Ambulatory Visit: Payer: Self-pay

## 2021-07-08 ENCOUNTER — Ambulatory Visit
Admission: EM | Admit: 2021-07-08 | Discharge: 2021-07-08 | Disposition: A | Discharge status: Home / Self Care | Payer: BLUE CROSS/BLUE SHIELD | Attending: Physician Assistant | Admitting: Physician Assistant

## 2021-07-08 DIAGNOSIS — K219 Gastro-esophageal reflux disease without esophagitis: Secondary | ICD-10-CM | POA: Diagnosis not present

## 2021-07-08 MED ORDER — ESOMEPRAZOLE MAGNESIUM 20 MG PO PACK
20.0000 mg | PACK | Freq: Two times a day (BID) | ORAL | 0 refills | Status: DC
Start: 2021-07-08 — End: 2023-03-05

## 2021-07-08 NOTE — ED Provider Notes (Signed)
[name]    CSN: [id number] Arrival date & time: 07/08/21  0908      History   Chief Complaint Chief Complaint  Patient presents with   Medication Refill    nexium    HPI Alicia Parks is a 46 y.o. female.   Patient here today for refill of nexium. She reports she has been taking famotidine but this does not seem to be very helpful. She ran out of nexium last week and has had worsened GERD symptoms since that time. She states she has been eating in smaller portions to try to battle symptoms. She denies any side effects from Nexium when she was taking in the past. She reports that she is having trouble getting refills from her PCP and so she is actually switching PCPs.   The history is provided by the patient.  Medication Refill  Past Medical History:  Diagnosis Date   Back pain    Complication of anesthesia    Ear drum perforation    Fibromyalgia    GERD (gastroesophageal reflux disease)    Migraine    Restless leg syndrome     Patient Active Problem List   Diagnosis Date Noted   Symptomatic cholelithiasis 05/30/2021   Cholecystitis 05/30/2021   Dental abscess 10/04/2020   GERD (gastroesophageal reflux disease) 10/04/2020   Generalized anxiety disorder 04/25/2020   Bipolar I disorder, most recent episode depressed ([place]) 04/25/2020   Moderate episode of recurrent major depressive disorder ([place]) 04/25/2020    Past Surgical History:  Procedure Laterality Date   CHOLECYSTECTOMY N/A 05/30/2021   Procedure: LAPAROSCOPIC CHOLECYSTECTOMY WITH ICG DYE;  Surgeon: Rolm Bookbinder, MD;  Location: [place];  Service: General;  Laterality: N/A;   TONSILLECTOMY     TUBAL LIGATION      OB History     Gravida  3   Para  3   Term  3   Preterm  0   AB  0   Living  3      SAB  0   IAB  0   Ectopic  0   Multiple  0   Live Births               Home Medications    Prior to Admission medications   Medication Sig Start Date End Date  Taking? Authorizing Provider  diphenhydrAMINE (SOMINEX) 25 MG tablet Take 25 mg by mouth daily as needed for allergies.    [provider]  esomeprazole (NEXIUM) 20 MG packet Take 20 mg by mouth in the morning and at bedtime. 07/08/21   Francene Finders, PA-C  famotidine (PEPCID) 40 MG/5ML suspension Take 2.5 mLs (20 mg total) by mouth 2 (two) times daily. Patient taking differently: Take 40 mg by mouth 2 (two) times daily. 03/28/21   Jaynee Eagles, PA-C  hydrOXYzine (ATARAX) 10 MG/5ML syrup Take 12.5 mLs (25 mg total) by mouth 3 (three) times daily as needed for anxiety. Patient not taking: Reported on 04/25/2021 03/25/21   Suella Broad A, PA-C  hydrOXYzine (ATARAX/VISTARIL) 25 MG tablet TAKE 1 TABLET (25 MG TOTAL) BY MOUTH 4 (FOUR) TIMES DAILY AS NEEDED. Patient not taking: Reported on 05/30/2021 02/13/21   Salley Slaughter, NP  lidocaine (XYLOCAINE) 2 % solution Use as directed 10 mLs in the mouth or throat every 8 (eight) hours as needed for mouth pain. 06/18/21   Raspet, Erin K, PA-C  ondansetron (ZOFRAN-ODT) 4 MG disintegrating tablet Take 1 tablet (4 mg total)  by mouth every 8 (eight) hours as needed for nausea or vomiting. 06/14/21   Gareth Morgan, MD  oxyCODONE (OXY IR/ROXICODONE) 5 MG immediate release tablet Take 1 tablet (5 mg total) by mouth every 4 (four) hours as needed for moderate pain. 05/30/21   Rolm Bookbinder, MD  promethazine-dextromethorphan (PROMETHAZINE-DM) 6.25-15 MG/5ML syrup Take 5 mLs by mouth 4 (four) times daily as needed for cough. 06/20/21   Chase Picket, MD  sertraline (ZOLOFT) 25 MG tablet Take 1 tablet (25 mg total) by mouth daily. 06/21/21   Salley Slaughter, NP  sucralfate (CARAFATE) 1 GM/10ML suspension Take 10 mLs (1 g total) by mouth 4 (four) times daily -  with meals and at bedtime. Patient not taking: Reported on 04/25/2021 03/25/21   Palumbo, April, MD  ursodiol (ACTIGALL) 300 MG capsule Take 1 capsule by mouth three times daily for 30  days. Patient not taking: Reported on 05/30/2021 04/25/21   Regan Lemming, MD    Family History Family History  Problem Relation Age of Onset   Osteoporosis Mother    Hypertension Father    Heart disease Father    Gout Father    Cancer Other     Social History Social History   Tobacco Use   Smoking status: Never   Smokeless tobacco: Never  Vaping Use   Vaping Use: Never used  Substance Use Topics   Alcohol use: No   Drug use: Yes    Types: Marijuana     Allergies   Aspirin, Compazine [prochlorperazine edisylate], Effexor [venlafaxine], and Tylenol [acetaminophen]   Review of Systems Review of Systems  Constitutional:  Negative for chills and fever.  Eyes:  Negative for discharge and redness.  Respiratory:  Negative for shortness of breath.   Gastrointestinal:  Negative for abdominal pain, nausea and vomiting.    Physical Exam Triage Vital Signs ED Triage Vitals  Enc Vitals Group     BP 07/08/21 0932 125/88     Pulse Rate 07/08/21 0932 81     Resp 07/08/21 0932 18     Temp 07/08/21 0932 98 F (36.7 C)     Temp Source 07/08/21 0932 Oral     SpO2 07/08/21 0932 98 %     Weight --      Height --      Head Circumference --      Peak Flow --      Pain Score 07/08/21 0934 0     Pain Loc --      Pain Edu? --      Excl. in [place]? --    No data found.  Updated Vital Signs BP 125/88 (BP Location: Left Arm)    Pulse 81    Temp 98 F (36.7 C) (Oral)    Resp 18    LMP 06/11/2021    SpO2 98%   Physical Exam Vitals and nursing note reviewed.  Constitutional:      General: She is not in acute distress.    Appearance: Normal appearance. She is not ill-appearing.  HENT:     Head: Normocephalic and atraumatic.  Eyes:     Conjunctiva/sclera: Conjunctivae normal.  Cardiovascular:     Rate and Rhythm: Normal rate.  Pulmonary:     Effort: Pulmonary effort is normal.  Neurological:     Mental Status: She is alert.  Psychiatric:        Mood and Affect: Mood normal.         Behavior: Behavior normal.  Thought Content: Thought content normal.     UC Treatments / Results  Labs (all labs ordered are listed, but only abnormal results are displayed) Labs Reviewed - No data to display  EKG   Radiology No results found.  Procedures Procedures (including critical care time)  Medications Ordered in UC Medications - No data to display  Initial Impression / Assessment and Plan / UC Course  I have reviewed the triage vital signs and the nursing notes.  Pertinent labs & imaging results that were available during my care of the patient were reviewed by me and considered in my medical decision making (see chart for details).    Will refill nexium as requested. Recommended follow up with PCP and encouraged follow up in office with any further concerns in the meantime. Patient expresses understanding.   Final Clinical Impressions(s) / UC Diagnoses   Final diagnoses:  Gastroesophageal reflux disease without esophagitis   Discharge Instructions   None    ED Prescriptions     Medication Sig Dispense Auth. Provider   esomeprazole (NEXIUM) 20 MG packet Take 20 mg by mouth in the morning and at bedtime. 41 each Francene Finders, PA-C      PDMP not reviewed this encounter.   Francene Finders, PA-C 07/08/21 1005

## 2021-07-08 NOTE — ED Triage Notes (Signed)
Pt presents today for a Nexium refill, noting that she ran out of meds last week. Pt contacted PCP, who is OOO until 07/2021. Pt c/o heartburn and having to eat in smaller portions since running out of meds.

## 2021-07-13 ENCOUNTER — Other Ambulatory Visit (HOSPITAL_COMMUNITY): Payer: Self-pay | Admitting: Psychiatry

## 2021-07-13 DIAGNOSIS — F411 Generalized anxiety disorder: Secondary | ICD-10-CM

## 2021-07-24 DIAGNOSIS — M545 Low back pain, unspecified: Secondary | ICD-10-CM | POA: Insufficient documentation

## 2021-08-14 ENCOUNTER — Telehealth (HOSPITAL_COMMUNITY): Payer: BLUE CROSS/BLUE SHIELD | Admitting: Psychiatry

## 2021-08-14 ENCOUNTER — Encounter (HOSPITAL_COMMUNITY): Payer: Self-pay

## 2021-09-23 ENCOUNTER — Emergency Department (HOSPITAL_COMMUNITY)
Admission: EM | Admit: 2021-09-23 | Discharge: 2021-09-24 | Discharge status: Against Medical Advice | Payer: 59 | Attending: Emergency Medicine | Admitting: Emergency Medicine

## 2021-09-23 ENCOUNTER — Other Ambulatory Visit: Payer: Self-pay

## 2021-09-23 ENCOUNTER — Emergency Department (HOSPITAL_COMMUNITY): Payer: 59

## 2021-09-23 DIAGNOSIS — R42 Dizziness and giddiness: Secondary | ICD-10-CM | POA: Diagnosis present

## 2021-09-23 DIAGNOSIS — R197 Diarrhea, unspecified: Secondary | ICD-10-CM | POA: Diagnosis not present

## 2021-09-23 DIAGNOSIS — Z5321 Procedure and treatment not carried out due to patient leaving prior to being seen by health care provider: Secondary | ICD-10-CM | POA: Insufficient documentation

## 2021-09-23 DIAGNOSIS — R112 Nausea with vomiting, unspecified: Secondary | ICD-10-CM | POA: Diagnosis not present

## 2021-09-23 LAB — CBC WITH DIFFERENTIAL/PLATELET
Abs Immature Granulocytes: 0.12 10*3/uL — ABNORMAL HIGH (ref 0.00–0.07)
Basophils Absolute: 0.1 10*3/uL (ref 0.0–0.1)
Basophils Relative: 0 %
Eosinophils Absolute: 0.1 10*3/uL (ref 0.0–0.5)
Eosinophils Relative: 1 %
HCT: 46 % (ref 36.0–46.0)
Hemoglobin: 15.8 g/dL — ABNORMAL HIGH (ref 12.0–15.0)
Immature Granulocytes: 1 %
Lymphocytes Relative: 5 %
Lymphs Abs: 1.1 10*3/uL (ref 0.7–4.0)
MCH: 31.4 pg (ref 26.0–34.0)
MCHC: 34.3 g/dL (ref 30.0–36.0)
MCV: 91.5 fL (ref 80.0–100.0)
Monocytes Absolute: 0.8 10*3/uL (ref 0.1–1.0)
Monocytes Relative: 4 %
Neutro Abs: 19.8 10*3/uL — ABNORMAL HIGH (ref 1.7–7.7)
Neutrophils Relative %: 89 %
Platelets: 389 10*3/uL (ref 150–400)
RBC: 5.03 MIL/uL (ref 3.87–5.11)
RDW: 13 % (ref 11.5–15.5)
WBC: 21.9 10*3/uL — ABNORMAL HIGH (ref 4.0–10.5)
nRBC: 0 % (ref 0.0–0.2)

## 2021-09-23 LAB — COMPREHENSIVE METABOLIC PANEL
ALT: 22 U/L (ref 0–44)
AST: 17 U/L (ref 15–41)
Albumin: 4 g/dL (ref 3.5–5.0)
Alkaline Phosphatase: 49 U/L (ref 38–126)
Anion gap: 10 (ref 5–15)
BUN: 12 mg/dL (ref 6–20)
CO2: 24 mmol/L (ref 22–32)
Calcium: 9.1 mg/dL (ref 8.9–10.3)
Chloride: 104 mmol/L (ref 98–111)
Creatinine, Ser: 0.71 mg/dL (ref 0.44–1.00)
GFR, Estimated: >60 mL/min (ref >60)
Glucose, Bld: 134 mg/dL — ABNORMAL HIGH (ref 70–99)
Potassium: 4.2 mmol/L (ref 3.5–5.1)
Sodium: 138 mmol/L (ref 135–145)
Total Bilirubin: 0.9 mg/dL (ref 0.3–1.2)
Total Protein: 6.9 g/dL (ref 6.5–8.1)

## 2021-09-23 LAB — URINALYSIS, ROUTINE W REFLEX MICROSCOPIC
Bilirubin Urine: NEGATIVE
Glucose, UA: NEGATIVE mg/dL
Hgb urine dipstick: NEGATIVE
Ketones, ur: 20 mg/dL — AB
Leukocytes,Ua: NEGATIVE
Nitrite: NEGATIVE
Protein, ur: NEGATIVE mg/dL
Specific Gravity, Urine: 1.027 (ref 1.005–1.030)
pH: 8 (ref 5.0–8.0)

## 2021-09-23 LAB — LIPASE, BLOOD: Lipase: 41 U/L (ref 11–51)

## 2021-09-23 LAB — RAPID URINE DRUG SCREEN, HOSP PERFORMED
Amphetamines: NOT DETECTED
Barbiturates: NOT DETECTED
Benzodiazepines: NOT DETECTED
Cocaine: NOT DETECTED
Opiates: NOT DETECTED
Tetrahydrocannabinol: POSITIVE — AB

## 2021-09-23 LAB — TROPONIN I (HIGH SENSITIVITY): Troponin I (High Sensitivity): 3 ng/L (ref <18)

## 2021-09-23 LAB — I-STAT BETA HCG BLOOD, ED (MC, WL, AP ONLY): I-stat hCG, quantitative: <5 m[IU]/mL (ref <5)

## 2021-09-23 NOTE — ED Provider Triage Note (Signed)
Emergency Medicine Provider Triage Evaluation Note ? ?Alicia Parks , a 46 y.o. female  was evaluated in triage.  Pt complains of feeling generally unwell for about 3 weeks.  States worse over the past 24 hours to the point she cannot hold anything down.  Reports dizzy spells with standing, diarrhea, and just feels like something "isnt right".  Denies possibility of pregnancy. ? ?Review of Systems  ?Positive: N/V/D, ligthheaded ?Negative: fever ? ?Physical Exam  ?BP 106/73 (BP Location: Right Arm)   Pulse 86   Temp 97.8 ?F (36.6 ?C)   Resp 18   SpO2 98%  ? ?Gen:   Awake, no distress   ?Resp:  Normal effort  ?MSK:   Moves extremities without difficulty  ?Other:   ? ?Medical Decision Making  ?Medically screening exam initiated at 10:25 PM.  Appropriate orders placed.  Alicia Parks was informed that the remainder of the evaluation will be completed by another provider, this initial triage assessment does not replace that evaluation, and the importance of remaining in the ED until their evaluation is complete. ? ?N/V/D, lightheaded.  Symptoms ongoing for about 3 weeks but worse today.  Had some chest pain while with EMS.  EKG, labs, chest x-ray. ?  ?Larene Pickett, PA-C ?09/23/21 2227 ? ?

## 2021-09-23 NOTE — ED Triage Notes (Signed)
Pt here from hotel via [place] c/o N/V/D x3 days, but got worse today. Pt reports 5 episodes of emesis in the last hour, cannot tolerate anything PO. Pt had gallbladder removed last year. EMS gave 4mg  zofran, 18g LFA. 114/72, 88HR, 99% RA CBG 126. ?

## 2021-09-24 NOTE — ED Notes (Signed)
Patient left on own accord °

## 2021-10-07 ENCOUNTER — Ambulatory Visit
Admission: EM | Admit: 2021-10-07 | Discharge: 2021-10-07 | Disposition: A | Discharge status: Home / Self Care | Payer: BLUE CROSS/BLUE SHIELD

## 2021-10-07 NOTE — ED Notes (Signed)
Went to lobby to call patient, not in lobby. ?

## 2021-10-08 ENCOUNTER — Encounter (HOSPITAL_COMMUNITY): Payer: Self-pay

## 2021-10-08 ENCOUNTER — Ambulatory Visit (HOSPITAL_COMMUNITY)
Admission: EM | Admit: 2021-10-08 | Discharge: 2021-10-08 | Disposition: A | Discharge status: Home / Self Care | Payer: 59 | Attending: Emergency Medicine | Admitting: Emergency Medicine

## 2021-10-08 ENCOUNTER — Emergency Department (HOSPITAL_COMMUNITY)
Admission: EM | Admit: 2021-10-08 | Discharge: 2021-10-09 | Discharge status: Against Medical Advice | Payer: 59 | Attending: Emergency Medicine | Admitting: Emergency Medicine

## 2021-10-08 ENCOUNTER — Other Ambulatory Visit: Payer: Self-pay

## 2021-10-08 ENCOUNTER — Emergency Department (HOSPITAL_COMMUNITY): Payer: 59

## 2021-10-08 DIAGNOSIS — M25519 Pain in unspecified shoulder: Secondary | ICD-10-CM | POA: Insufficient documentation

## 2021-10-08 DIAGNOSIS — M79602 Pain in left arm: Secondary | ICD-10-CM | POA: Insufficient documentation

## 2021-10-08 DIAGNOSIS — Z5321 Procedure and treatment not carried out due to patient leaving prior to being seen by health care provider: Secondary | ICD-10-CM | POA: Insufficient documentation

## 2021-10-08 DIAGNOSIS — M25512 Pain in left shoulder: Secondary | ICD-10-CM | POA: Diagnosis not present

## 2021-10-08 DIAGNOSIS — M549 Dorsalgia, unspecified: Secondary | ICD-10-CM | POA: Insufficient documentation

## 2021-10-08 DIAGNOSIS — R079 Chest pain, unspecified: Secondary | ICD-10-CM | POA: Insufficient documentation

## 2021-10-08 LAB — BASIC METABOLIC PANEL
Anion gap: 7 (ref 5–15)
BUN: 8 mg/dL (ref 6–20)
CO2: 26 mmol/L (ref 22–32)
Calcium: 9.1 mg/dL (ref 8.9–10.3)
Chloride: 106 mmol/L (ref 98–111)
Creatinine, Ser: 0.68 mg/dL (ref 0.44–1.00)
GFR, Estimated: >60 mL/min (ref >60)
Glucose, Bld: 103 mg/dL — ABNORMAL HIGH (ref 70–99)
Potassium: 4.4 mmol/L (ref 3.5–5.1)
Sodium: 139 mmol/L (ref 135–145)

## 2021-10-08 LAB — CBC
HCT: 43.7 % (ref 36.0–46.0)
Hemoglobin: 14.5 g/dL (ref 12.0–15.0)
MCH: 30.3 pg (ref 26.0–34.0)
MCHC: 33.2 g/dL (ref 30.0–36.0)
MCV: 91.2 fL (ref 80.0–100.0)
Platelets: 412 10*3/uL — ABNORMAL HIGH (ref 150–400)
RBC: 4.79 MIL/uL (ref 3.87–5.11)
RDW: 12.7 % (ref 11.5–15.5)
WBC: 15.1 10*3/uL — ABNORMAL HIGH (ref 4.0–10.5)
nRBC: 0 % (ref 0.0–0.2)

## 2021-10-08 LAB — TROPONIN I (HIGH SENSITIVITY): Troponin I (High Sensitivity): 3 ng/L (ref <18)

## 2021-10-08 LAB — I-STAT BETA HCG BLOOD, ED (MC, WL, AP ONLY): I-stat hCG, quantitative: <5 m[IU]/mL (ref <5)

## 2021-10-08 MED ORDER — MELOXICAM 15 MG PO TABS: 15.0000 mg | ORAL_TABLET | Freq: Every day | ORAL | 0 refills | Status: AC

## 2021-10-08 NOTE — ED Triage Notes (Signed)
Pt c/o lt shoulder pain radiating to deltoid and to center of neck x2 days. Denies injury but states going through a divorce and had to pick up all her belongings out of a parking lot a wk ago. States pain of lifting up lt arm. ?

## 2021-10-08 NOTE — Discharge Instructions (Addendum)
Take meloxicam once daily for pain and inflammation. ?Wear sling for comfort. ?Apply ice at night for 5 to 10 minutes and let skin warm up. ?

## 2021-10-08 NOTE — ED Notes (Signed)
Pt called for vitals no answer. °

## 2021-10-08 NOTE — ED Triage Notes (Signed)
Pt arrives to ED POV c/o CP pain and Left arm, shoulder and back pain x2 days. Pt states she has been under a lot of stress. Pt tearful in triage. ?

## 2021-10-08 NOTE — ED Provider Notes (Signed)
? ?[facility] ?Provider Note ? ?Patient Contact: 12:35 PM (approximate) ? ? ?History  ? ?Shoulder Pain ? ? ?HPI ? ?MARSELA COKLEY is a 46 y.o. female presents to the urgent care with left shoulder pain that radiates to the elbow.  Pain is not reproduced with range of motion of the neck.  No numbness or tingling that extends to the left hand.  No left upper extremity weakness.  No chest pain, chest tightness or shortness of breath.  Patient states that she has engaged in heavy lifting over the past several days. ? ?  ? ? ?Physical Exam  ? ?Triage Vital Signs: ?ED Triage Vitals [10/08/21 1215]  ?Enc Vitals Group  ?   BP 131/85  ?   Pulse Rate 81  ?   Resp 18  ?   Temp 98.4 ?F (36.9 ?C)  ?   Temp Source Oral  ?   SpO2 98 %  ?   Weight   ?   Height   ?   Head Circumference   ?   Peak Flow   ?   Pain Score 9  ?   Pain Loc   ?   Pain Edu?   ?   Excl. in [place]?   ? ? ?Most recent vital signs: ?Vitals:  ? 10/08/21 1215  ?BP: 131/85  ?Pulse: 81  ?Resp: 18  ?Temp: 98.4 ?F (36.9 ?C)  ?SpO2: 98%  ? ? ? ?General: Alert and in no acute distress. ?Eyes:  PERRL. EOMI. ?Head: No acute traumatic findings ?ENT: ?     Ears: Tms are pearly.  ?     Nose: No congestion/rhinnorhea. ?     Mouth/Throat: Mucous membranes are moist.  ?Neck: No stridor. No cervical spine tenderness to palpation. ?Cardiovascular:  Good peripheral perfusion ?Respiratory: Normal respiratory effort without tachypnea or retractions. Lungs CTAB. Good air entry to the bases with no decreased or absent breath sounds. ?Gastrointestinal: Bowel sounds ?4 quadrants. Soft and nontender to palpation. No guarding or rigidity. No palpable masses. No distention. No CVA tenderness. ?Musculoskeletal: Patient has symmetric strength in the upper extremities.  Patient has pain but no weakness with left rotator cuff testing.  Full range of motion to all extremities.  ?Neurologic:  No gross focal neurologic deficits are appreciated.  ?Skin:   No rash  noted ?Other: ? ? ?ED Results / Procedures / Treatments  ? ?Labs ?(all labs ordered are listed, but only abnormal results are displayed) ?Labs Reviewed - No data to display ? ? ? ?PROCEDURES: ? ?Critical Care performed: No ? ?Procedures ? ? ?MEDICATIONS ORDERED IN ED: ?Medications - No data to display ? ? ?IMPRESSION / MDM / ASSESSMENT AND PLAN / ED COURSE  ?I reviewed the triage vital signs and the nursing notes. ?             ?               ? ?Assessment and plan: ?Shoulder pain:  ? ?Differential diagnosis includes, but is not limited to, rotator cuff tendinitis ?46 year old female presents to the urgent care with left-sided shoulder pain for the past 2 days. ? ?Vital signs are reassuring at triage.  On physical exam, patient was alert, active and nontoxic-appearing.  Suspect rotator cuff tendinitis.  Patient was placed in a sling was prescribed meloxicam for discomfort advised to use ice.  She was advised to follow-up with primary care as needed.  All patient questions were answered. ? ?  ? ? ?  FINAL CLINICAL IMPRESSION(S) / ED DIAGNOSES  ? ?Final diagnoses:  ?Acute pain of left shoulder  ? ? ? ?Rx / DC Orders  ? ?ED Discharge Orders   ? ?      Ordered  ?  meloxicam (MOBIC) 15 MG tablet  Daily       ? 10/08/21 1230  ? ?  ?  ? ?  ? ? ? ?Note:  This document was prepared using Dragon voice recognition software and may include unintentional dictation errors. ?  ?Lannie Fields, PA-C ?10/08/21 1237 ? ?

## 2022-01-12 ENCOUNTER — Emergency Department (HOSPITAL_COMMUNITY)
Admission: EM | Admit: 2022-01-12 | Discharge: 2022-01-12 | Disposition: A | Discharge status: Home / Self Care | Payer: 59 | Attending: Emergency Medicine | Admitting: Emergency Medicine

## 2022-01-12 ENCOUNTER — Emergency Department (HOSPITAL_COMMUNITY): Payer: 59

## 2022-01-12 ENCOUNTER — Encounter (HOSPITAL_COMMUNITY): Payer: Self-pay

## 2022-01-12 ENCOUNTER — Other Ambulatory Visit: Payer: Self-pay

## 2022-01-12 DIAGNOSIS — M7989 Other specified soft tissue disorders: Secondary | ICD-10-CM | POA: Diagnosis present

## 2022-01-12 DIAGNOSIS — R079 Chest pain, unspecified: Secondary | ICD-10-CM | POA: Diagnosis not present

## 2022-01-12 DIAGNOSIS — R0602 Shortness of breath: Secondary | ICD-10-CM | POA: Diagnosis not present

## 2022-01-12 DIAGNOSIS — M25472 Effusion, left ankle: Secondary | ICD-10-CM

## 2022-01-12 DIAGNOSIS — R600 Localized edema: Secondary | ICD-10-CM | POA: Diagnosis not present

## 2022-01-12 LAB — CBC WITH DIFFERENTIAL/PLATELET
Abs Immature Granulocytes: 0.07 10*3/uL (ref 0.00–0.07)
Basophils Absolute: 0 10*3/uL (ref 0.0–0.1)
Basophils Relative: 0 %
Eosinophils Absolute: 0.2 10*3/uL (ref 0.0–0.5)
Eosinophils Relative: 2 %
HCT: 41.3 % (ref 36.0–46.0)
Hemoglobin: 14.1 g/dL (ref 12.0–15.0)
Immature Granulocytes: 1 %
Lymphocytes Relative: 26 %
Lymphs Abs: 3.4 10*3/uL (ref 0.7–4.0)
MCH: 31.2 pg (ref 26.0–34.0)
MCHC: 34.1 g/dL (ref 30.0–36.0)
MCV: 91.4 fL (ref 80.0–100.0)
Monocytes Absolute: 0.6 10*3/uL (ref 0.1–1.0)
Monocytes Relative: 5 %
Neutro Abs: 9.1 10*3/uL — ABNORMAL HIGH (ref 1.7–7.7)
Neutrophils Relative %: 66 %
Platelets: 349 10*3/uL (ref 150–400)
RBC: 4.52 MIL/uL (ref 3.87–5.11)
RDW: 13.7 % (ref 11.5–15.5)
WBC: 13.5 10*3/uL — ABNORMAL HIGH (ref 4.0–10.5)
nRBC: 0 % (ref 0.0–0.2)

## 2022-01-12 LAB — BASIC METABOLIC PANEL
Anion gap: 7 (ref 5–15)
BUN: 6 mg/dL (ref 6–20)
CO2: 27 mmol/L (ref 22–32)
Calcium: 8.9 mg/dL (ref 8.9–10.3)
Chloride: 106 mmol/L (ref 98–111)
Creatinine, Ser: 0.63 mg/dL (ref 0.44–1.00)
GFR, Estimated: >60 mL/min (ref >60)
Glucose, Bld: 102 mg/dL — ABNORMAL HIGH (ref 70–99)
Potassium: 4.1 mmol/L (ref 3.5–5.1)
Sodium: 140 mmol/L (ref 135–145)

## 2022-01-12 LAB — TROPONIN I (HIGH SENSITIVITY)
Troponin I (High Sensitivity): 5 ng/L (ref <18)
Troponin I (High Sensitivity): 3 ng/L (ref <18)

## 2022-01-12 LAB — I-STAT BETA HCG BLOOD, ED (MC, WL, AP ONLY): I-stat hCG, quantitative: <5 m[IU]/mL (ref <5)

## 2022-01-12 LAB — BRAIN NATRIURETIC PEPTIDE: B Natriuretic Peptide: 15.4 pg/mL (ref 0.0–100.0)

## 2022-01-12 MED ORDER — FUROSEMIDE 20 MG PO TABS: 20.0000 mg | ORAL_TABLET | Freq: Every day | ORAL | 0 refills | Status: DC

## 2022-01-12 NOTE — ED Provider Notes (Signed)
[facility] EMERGENCY DEPARTMENT Provider Note   CSN: [id number] Arrival date & time: 01/12/22  [id number]     History  Chief Complaint  Patient presents with   Chest Pain    Alicia Parks is a 46 y.o. female.   Chest Pain Associated symptoms: shortness of breath   Patient presents for intermittent bilateral ankle swelling, shortness of breath, and left-sided chest pain.  Medical history includes anxiety, bipolar disorder, depression, GERD, fibromyalgia, and cholecystectomy.  Ankle swelling does seem to worsen with prolonged episodes of sitting.  She denies any associated pain, erythema, or warmth.  It is equal bilaterally.  Patient describes her episodes of chest pains as mild.  At times they radiate to her shoulder.  She has noticed that it may occur when she bends over and then stands up.  She continues to exercise and goes for walks.  Sometimes after her walks, she will experience this discomfort.  She also has a history of GERD but feels like her recent chest pains are different than her GERD symptoms.  Currently, she is asymptomatic.  She does have a family history of early ACS (father).  She has not established care with cardiology.     Home Medications Prior to Admission medications   Medication Sig Start Date End Date Taking? Authorizing Provider  furosemide (LASIX) 20 MG tablet Take 1 tablet (20 mg total) by mouth daily for 4 days. 01/12/22 01/16/22 Yes Godfrey Pick, MD  diphenhydrAMINE (SOMINEX) 25 MG tablet Take 25 mg by mouth daily as needed for allergies.    [provider]  esomeprazole (NEXIUM) 20 MG packet Take 20 mg by mouth in the morning and at bedtime. 07/08/21   Francene Finders, PA-C  famotidine (PEPCID) 40 MG/5ML suspension Take 2.5 mLs (20 mg total) by mouth 2 (two) times daily. Patient taking differently: Take 40 mg by mouth 2 (two) times daily. 03/28/21   Jaynee Eagles, PA-C  hydrOXYzine (ATARAX) 10 MG/5ML syrup Take 12.5 mLs (25 mg total) by  mouth 3 (three) times daily as needed for anxiety. Patient not taking: Reported on 04/25/2021 03/25/21   Suella Broad A, PA-C  hydrOXYzine (ATARAX/VISTARIL) 25 MG tablet TAKE 1 TABLET (25 MG TOTAL) BY MOUTH 4 (FOUR) TIMES DAILY AS NEEDED. Patient not taking: Reported on 05/30/2021 02/13/21   Salley Slaughter, NP  lidocaine (XYLOCAINE) 2 % solution Use as directed 10 mLs in the mouth or throat every 8 (eight) hours as needed for mouth pain. 06/18/21   Raspet, Erin K, PA-C  ondansetron (ZOFRAN-ODT) 4 MG disintegrating tablet Take 1 tablet (4 mg total) by mouth every 8 (eight) hours as needed for nausea or vomiting. 06/14/21   Gareth Morgan, MD  oxyCODONE (OXY IR/ROXICODONE) 5 MG immediate release tablet Take 1 tablet (5 mg total) by mouth every 4 (four) hours as needed for moderate pain. 05/30/21   Rolm Bookbinder, MD  promethazine-dextromethorphan (PROMETHAZINE-DM) 6.25-15 MG/5ML syrup Take 5 mLs by mouth 4 (four) times daily as needed for cough. 06/20/21   Chase Picket, MD  sertraline (ZOLOFT) 25 MG tablet TAKE 1 TABLET (25 MG TOTAL) BY MOUTH DAILY. 07/15/21   Salley Slaughter, NP  sucralfate (CARAFATE) 1 GM/10ML suspension Take 10 mLs (1 g total) by mouth 4 (four) times daily -  with meals and at bedtime. Patient not taking: Reported on 04/25/2021 03/25/21   Palumbo, April, MD  ursodiol (ACTIGALL) 300 MG capsule Take 1 capsule by mouth three times daily for 30 days.  Patient not taking: Reported on 05/30/2021 04/25/21   Ernie Avena, MD      Allergies    Aspirin, Compazine [prochlorperazine edisylate], Effexor [venlafaxine], Prochlorperazine, and Tylenol [acetaminophen]    Review of Systems   Review of Systems  Respiratory:  Positive for shortness of breath.   Cardiovascular:  Positive for chest pain and leg swelling.  All other systems reviewed and are negative.   Physical Exam Updated Vital Signs BP (!) 134/104 (BP Location: Left Leg)   Pulse 72   Temp 97.8 F (36.6 C)    Resp 16   Ht 5\' 4"  (1.626 m)   Wt 93 kg   SpO2 100%   BMI 35.19 kg/m  Physical Exam Vitals and nursing note reviewed.  Constitutional:      General: She is not in acute distress.    Appearance: She is well-developed. She is not ill-appearing, toxic-appearing or diaphoretic.  HENT:     Head: Normocephalic and atraumatic.  Eyes:     Extraocular Movements: Extraocular movements intact.     Conjunctiva/sclera: Conjunctivae normal.  Neck:     Vascular: No JVD.  Cardiovascular:     Rate and Rhythm: Normal rate and regular rhythm.     Heart sounds: No murmur heard. Pulmonary:     Effort: Pulmonary effort is normal. No respiratory distress.     Breath sounds: Normal breath sounds.  Chest:     Chest wall: No tenderness.  Abdominal:     Palpations: Abdomen is soft.     Tenderness: There is no abdominal tenderness.  Musculoskeletal:        General: No swelling.     Cervical back: Normal range of motion and neck supple.     Right lower leg: Edema present.     Left lower leg: Edema present.     Comments: Mild edema in the ankles  Skin:    General: Skin is warm and dry.     Capillary Refill: Capillary refill takes less than 2 seconds.     Coloration: Skin is not cyanotic or pale.  Neurological:     General: No focal deficit present.     Mental Status: She is alert and oriented to person, place, and time.  Psychiatric:        Mood and Affect: Mood normal.        Behavior: Behavior normal.     ED Results / Procedures / Treatments   Labs (all labs ordered are listed, but only abnormal results are displayed) Labs Reviewed  CBC WITH DIFFERENTIAL/PLATELET - Abnormal; Notable for the following components:      Result Value   WBC 13.5 (*)    Neutro Abs 9.1 (*)    All other components within normal limits  BASIC METABOLIC PANEL - Abnormal; Notable for the following components:   Glucose, Bld 102 (*)    All other components within normal limits  BRAIN NATRIURETIC PEPTIDE  I-STAT  BETA HCG BLOOD, ED ([facility], [facility], [facility] ONLY)  I-STAT BETA HCG BLOOD, ED ([facility], [facility], [facility] ONLY)  TROPONIN I (HIGH SENSITIVITY)  TROPONIN I (HIGH SENSITIVITY)    EKG EKG Interpretation  Date/Time:  Sunday January 12 2022 09:47:00 EDT Ventricular Rate:  75 PR Interval:  122 QRS Duration: 88 QT Interval:  386 QTC Calculation: 431 R Axis:   71 Text Interpretation: Normal sinus rhythm Normal ECG Confirmed by [id number]) on 01/12/2022 12:21:32 PM  Radiology DG Chest 2 View  Result Date: 01/12/2022 CLINICAL DATA:  Shortness of breath. EXAM: CHEST - 2 VIEW COMPARISON:  10/08/2021 FINDINGS: Lungs are adequately inflated without airspace consolidation or effusion. Cardiomediastinal silhouette, bones and soft tissues are normal. IMPRESSION: No active cardiopulmonary disease. Electronically Signed   By: Elberta Fortis M.D.   On: 01/12/2022 10:08    Procedures Procedures    Medications Ordered in ED Medications - No data to display  ED Course/ Medical Decision Making/ A&P                           Medical Decision Making  This patient presents to the ED for concern of multiple complaints, this involves an extensive number of treatment options, and is a complaint that carries with it a high risk of complications and morbidity.  The differential diagnosis includes CHF, ACS, GERD, pericarditis, costochondritis   Co morbidities that complicate the patient evaluation  anxiety, bipolar disorder, depression, GERD, fibromyalgia, and cholecystectomy   Additional history obtained:  Additional history obtained from N/A External records from outside source obtained and reviewed including EMR   Lab Tests:  Ordered, and personally interpreted labs.  The pertinent results include: Leukocytosis which has been present on prior lab work, normal electrolytes, normal BNP, normal troponin   Imaging Studies ordered:  Ordered imaging studies including chest x-ray I independently visualized and interpreted  imaging which showed no acute findings I agree with the radiologist interpretation   Cardiac Monitoring: / EKG:  The patient was maintained on a cardiac monitor.  I personally viewed and interpreted the cardiac monitored which showed an underlying rhythm of: Sinus rhythm  Problem List / ED Course / Critical interventions / Medication management  Patient presents for intermittent bilateral ankle swelling, shortness of breath, and left-sided chest pain.  Prior to being bedded in the ED, patient underwent laboratory work-up.  Results of lab work are notable for a leukocytosis.  Her initial troponin was normal.  Patient has normal electrolytes and normal BNP.  Chest x-ray showed no acute findings.  EKG showed normal sinus rhythm without evidence of ST segment changes.  On initial assessment, patient is well-appearing.  Her vital signs are notable for elevated diastolic blood pressure.  She has no increased work of breathing.  She denies any current symptoms.  Ankles are mildly swollen.  They are equal bilaterally and without pitting edema.  On bedside ultrasound, patient does appear to have slight impaired diastolic relaxation.  Her IVC is normal in caliber and fully collapsible with inspiration.  No B-lines are present in the lung fields.  Patient does have a family history of early ACS.  There is concern for new, mild HFpEF.  She would benefit from cardiology follow-up.  Patient to be prescribed short course of a low-dose of Lasix.  Cardiology referral was ordered.  Patient is stable for discharge at this time.   Social Determinants of Health:  Currently homeless and living in a car         Final Clinical Impression(s) / ED Diagnoses Final diagnoses:  Swelling of both ankles  Chest pain, unspecified type  Exertional shortness of breath    Rx / DC Orders ED Discharge Orders          Ordered    Ambulatory referral to Cardiology       Comments: If you have not heard from the Cardiology  office within the next 72 hours please call [phone].   01/12/22 1248    furosemide (LASIX) 20 MG tablet  Daily        01/12/22 1249              Godfrey Pick, MD 01/12/22 1250

## 2022-01-12 NOTE — ED Provider Triage Note (Signed)
Emergency Medicine Provider Triage Evaluation Note  Alicia Parks , a 46 y.o. female  was evaluated in triage.  Pt complains of bilateral ankle swelling off and on for the last few months.  She is also feeling short of breath, endorses intermittent left-sided chest pain with radiation to left arm.  Denies any loss of consciousness, nausea, vomiting.  No fevers or chills, family history of CHF.Marland Kitchen  Review of Systems  Per HPI  Physical Exam  BP (!) 148/102 (BP Location: Left Arm)   Pulse 80   Temp 98.1 F (36.7 C) (Oral)   Resp 18   Ht 5\' 4"  (1.626 m)   Wt 86.2 kg   SpO2 99%   BMI 32.61 kg/m  Gen:   Awake, no distress   Resp:  Normal effort  MSK:   Moves extremities without difficulty  Other:  Bilateral ankle edema.  S1-S2, lungs clear to auscultation bilaterally  Medical Decision Making  Medically screening exam initiated at 9:44 AM.  Appropriate orders placed.  Alicia Parks was informed that the remainder of the evaluation will be completed by another provider, this initial triage assessment does not replace that evaluation, and the importance of remaining in the ED until their evaluation is complete.     [name], PA-C 01/12/22 0945

## 2022-01-12 NOTE — Discharge Instructions (Signed)
A prescription for a water pill was sent to your pharmacy.  Take this daily for the next 4 days.  You should hear from cardiology office tomorrow.  If you do not, call the number below.  Follow-up with the cardiologist for further testing and management of long-term medications that may improve your symptoms.  Return to the emergency department at any time for any new or worsening symptoms of concern.

## 2022-01-12 NOTE — ED Triage Notes (Signed)
L sided chest pain and leg swelling.  Also reports sob

## 2022-02-04 ENCOUNTER — Encounter: Payer: Self-pay | Admitting: *Deleted

## 2022-05-09 ENCOUNTER — Emergency Department (HOSPITAL_COMMUNITY): Payer: Self-pay

## 2022-05-09 ENCOUNTER — Emergency Department (HOSPITAL_COMMUNITY)
Admission: EM | Admit: 2022-05-09 | Discharge: 2022-05-09 | Disposition: A | Discharge status: Home / Self Care | Payer: Self-pay | Attending: Emergency Medicine | Admitting: Emergency Medicine

## 2022-05-09 ENCOUNTER — Other Ambulatory Visit: Payer: Self-pay

## 2022-05-09 ENCOUNTER — Encounter (HOSPITAL_COMMUNITY): Payer: Self-pay

## 2022-05-09 DIAGNOSIS — R1010 Upper abdominal pain, unspecified: Secondary | ICD-10-CM | POA: Insufficient documentation

## 2022-05-09 DIAGNOSIS — R079 Chest pain, unspecified: Secondary | ICD-10-CM

## 2022-05-09 LAB — CBC WITH DIFFERENTIAL/PLATELET
Abs Immature Granulocytes: 0.05 10*3/uL (ref 0.00–0.07)
Basophils Absolute: 0 10*3/uL (ref 0.0–0.1)
Basophils Relative: 0 %
Eosinophils Absolute: 0.2 10*3/uL (ref 0.0–0.5)
Eosinophils Relative: 2 %
HCT: 44.8 % (ref 36.0–46.0)
Hemoglobin: 15.5 g/dL — ABNORMAL HIGH (ref 12.0–15.0)
Immature Granulocytes: 0 %
Lymphocytes Relative: 30 %
Lymphs Abs: 4.4 10*3/uL — ABNORMAL HIGH (ref 0.7–4.0)
MCH: 31.4 pg (ref 26.0–34.0)
MCHC: 34.6 g/dL (ref 30.0–36.0)
MCV: 90.9 fL (ref 80.0–100.0)
Monocytes Absolute: 0.8 10*3/uL (ref 0.1–1.0)
Monocytes Relative: 5 %
Neutro Abs: 9.4 10*3/uL — ABNORMAL HIGH (ref 1.7–7.7)
Neutrophils Relative %: 63 %
Platelets: 396 10*3/uL (ref 150–400)
RBC: 4.93 MIL/uL (ref 3.87–5.11)
RDW: 13.1 % (ref 11.5–15.5)
WBC: 14.9 10*3/uL — ABNORMAL HIGH (ref 4.0–10.5)
nRBC: 0 % (ref 0.0–0.2)

## 2022-05-09 LAB — COMPREHENSIVE METABOLIC PANEL
ALT: 28 U/L (ref 0–44)
AST: 27 U/L (ref 15–41)
Albumin: 3.9 g/dL (ref 3.5–5.0)
Alkaline Phosphatase: 51 U/L (ref 38–126)
Anion gap: 10 (ref 5–15)
BUN: 9 mg/dL (ref 6–20)
CO2: 26 mmol/L (ref 22–32)
Calcium: 9.4 mg/dL (ref 8.9–10.3)
Chloride: 101 mmol/L (ref 98–111)
Creatinine, Ser: 0.77 mg/dL (ref 0.44–1.00)
GFR, Estimated: >60 mL/min (ref >60)
Glucose, Bld: 92 mg/dL (ref 70–99)
Potassium: 3.7 mmol/L (ref 3.5–5.1)
Sodium: 137 mmol/L (ref 135–145)
Total Bilirubin: 0.6 mg/dL (ref 0.3–1.2)
Total Protein: 7.2 g/dL (ref 6.5–8.1)

## 2022-05-09 LAB — TROPONIN I (HIGH SENSITIVITY): Troponin I (High Sensitivity): 5 ng/L (ref <18)

## 2022-05-09 LAB — LIPASE, BLOOD: Lipase: 38 U/L (ref 11–51)

## 2022-05-09 LAB — I-STAT BETA HCG BLOOD, ED (MC, WL, AP ONLY): I-stat hCG, quantitative: <5 m[IU]/mL (ref <5)

## 2022-05-09 MED ORDER — SUCRALFATE 1 G PO TABS: 1.0000 g | ORAL_TABLET | Freq: Three times a day (TID) | ORAL | 0 refills | Status: DC

## 2022-05-09 NOTE — ED Triage Notes (Signed)
Pt arrived POV from home c/o chest wall pain x3 days. PT denies any other complaints.

## 2022-05-09 NOTE — ED Notes (Signed)
Labs redrawn on this patient and sent to main lab.

## 2022-05-09 NOTE — ED Provider Triage Note (Signed)
Emergency Medicine Provider Triage Evaluation Note  Alicia Parks , a 46 y.o. female  was evaluated in triage.  Pt complains of chest pain and epigastric pain for 1 week.  She states the chest pain started over the last few days and is only on the right side, radiates up from the epigastric region.  She has shortness of breath with it when she tries to lie flat.  She endorses nausea and dizziness.  Denies fever, chills, syncope, palpitations.   Review of Systems  Positive: See above Negative: See above  Physical Exam  BP (!) 145/95 (BP Location: Left Arm)   Pulse 73   Temp 98.3 F (36.8 C) (Oral)   Resp 16   Ht 5\' 4"  (1.626 m)   Wt 96.2 kg   SpO2 97%   BMI 36.39 kg/m  Gen:   Awake, no distress   Resp:  Normal effort, lungs clear bilaterally  MSK:   Moves extremities without difficulty  Other:  Abdomen tender in epigastric region; heart sounds normal, rate and rhythm normal  Medical Decision Making  Medically screening exam initiated at 2:59 PM.  Appropriate orders placed.  MIRELA PARSLEY was informed that the remainder of the evaluation will be completed by another provider, this initial triage assessment does not replace that evaluation, and the importance of remaining in the ED until their evaluation is complete.     [name] R, Melton Alar 05/09/22 [phone]

## 2022-05-09 NOTE — ED Provider Notes (Signed)
[facility] EMERGENCY DEPARTMENT Provider Note   CSN: [id number] Arrival date & time: 05/09/22  1420     History  Chief Complaint  Patient presents with   Chest Pain    Alicia Parks is a 46 y.o. female.  Patient here with chest pain on and off for the last few weeks.  Maybe when she eats it is worse.  She has had bad reflux since her gallbladder was taken out last year.  She denies any recent travel or surgery.  No active chest pain or shortness of breath or cough or sputum production.  She is not on any birth control.  Denies any cough or sputum production.  Pain mostly in the upper abdomen.  The history is provided by the patient.       Home Medications Prior to Admission medications   Medication Sig Start Date End Date Taking? Authorizing Provider  sucralfate (CARAFATE) 1 g tablet Take 1 tablet (1 g total) by mouth 4 (four) times daily -  with meals and at bedtime for 14 days. 05/09/22 05/23/22 Yes Darrik Richman, DO  diphenhydrAMINE (SOMINEX) 25 MG tablet Take 25 mg by mouth daily as needed for allergies.    [provider]  esomeprazole (NEXIUM) 20 MG packet Take 20 mg by mouth in the morning and at bedtime. 07/08/21   Tomi Bamberger, PA-C  famotidine (PEPCID) 40 MG/5ML suspension Take 2.5 mLs (20 mg total) by mouth 2 (two) times daily. Patient taking differently: Take 40 mg by mouth 2 (two) times daily. 03/28/21   Wallis Bamberg, PA-C  furosemide (LASIX) 20 MG tablet Take 1 tablet (20 mg total) by mouth daily for 4 days. 01/12/22 01/16/22  Gloris Manchester, MD  hydrOXYzine (ATARAX) 10 MG/5ML syrup Take 12.5 mLs (25 mg total) by mouth 3 (three) times daily as needed for anxiety. Patient not taking: Reported on 04/25/2021 03/25/21   Army Melia A, PA-C  hydrOXYzine (ATARAX/VISTARIL) 25 MG tablet TAKE 1 TABLET (25 MG TOTAL) BY MOUTH 4 (FOUR) TIMES DAILY AS NEEDED. Patient not taking: Reported on 05/30/2021 02/13/21   Shanna Cisco, NP  lidocaine  (XYLOCAINE) 2 % solution Use as directed 10 mLs in the mouth or throat every 8 (eight) hours as needed for mouth pain. 06/18/21   Raspet, Erin K, PA-C  ondansetron (ZOFRAN-ODT) 4 MG disintegrating tablet Take 1 tablet (4 mg total) by mouth every 8 (eight) hours as needed for nausea or vomiting. 06/14/21   Alvira Monday, MD  oxyCODONE (OXY IR/ROXICODONE) 5 MG immediate release tablet Take 1 tablet (5 mg total) by mouth every 4 (four) hours as needed for moderate pain. 05/30/21   [name], MD  promethazine-dextromethorphan (PROMETHAZINE-DM) 6.25-15 MG/5ML syrup Take 5 mLs by mouth 4 (four) times daily as needed for cough. 06/20/21   Merrilee Jansky, MD  sertraline (ZOLOFT) 25 MG tablet TAKE 1 TABLET (25 MG TOTAL) BY MOUTH DAILY. 07/15/21   Shanna Cisco, NP  ursodiol (ACTIGALL) 300 MG capsule Take 1 capsule by mouth three times daily for 30 days. Patient not taking: Reported on 05/30/2021 04/25/21   Ernie Avena, MD      Allergies    Aspirin, Compazine [prochlorperazine edisylate], Effexor [venlafaxine], Prochlorperazine, and Tylenol [acetaminophen]    Review of Systems   Review of Systems  Physical Exam Updated Vital Signs BP (!) 125/91 (BP Location: Left Arm)   Pulse 94   Temp 98.6 F (37 C)   Resp 19   Ht 5'  4" (1.626 m)   Wt 96.2 kg   SpO2 98%   BMI 36.39 kg/m  Physical Exam Vitals and nursing note reviewed.  Constitutional:      General: She is not in acute distress.    Appearance: She is well-developed.  HENT:     Head: Normocephalic and atraumatic.  Eyes:     Conjunctiva/sclera: Conjunctivae normal.  Cardiovascular:     Rate and Rhythm: Normal rate and regular rhythm.     Heart sounds: No murmur heard. Pulmonary:     Effort: Pulmonary effort is normal. No respiratory distress.     Breath sounds: Normal breath sounds.  Chest:     Chest wall: Tenderness present.  Abdominal:     Palpations: Abdomen is soft.     Tenderness: There is no abdominal  tenderness.  Musculoskeletal:        General: No swelling.     Cervical back: Neck supple.  Skin:    General: Skin is warm and dry.     Capillary Refill: Capillary refill takes less than 2 seconds.  Neurological:     Mental Status: She is alert.  Psychiatric:        Mood and Affect: Mood normal.     ED Results / Procedures / Treatments   Labs (all labs ordered are listed, but only abnormal results are displayed) Labs Reviewed  CBC WITH DIFFERENTIAL/PLATELET - Abnormal; Notable for the following components:      Result Value   WBC 14.9 (*)    Hemoglobin 15.5 (*)    Neutro Abs 9.4 (*)    Lymphs Abs 4.4 (*)    All other components within normal limits  COMPREHENSIVE METABOLIC PANEL  LIPASE, BLOOD  I-STAT BETA HCG BLOOD, ED ([facility], [facility], [facility] ONLY)  TROPONIN I (HIGH SENSITIVITY)    EKG EKG Interpretation  Date/Time:  Friday May 09 2022 14:29:10 EST Ventricular Rate:  78 PR Interval:  122 QRS Duration: 86 QT Interval:  396 QTC Calculation: 451 R Axis:   64 Text Interpretation: Normal sinus rhythm Normal ECG Confirmed by Virgina Norfolk ([id number]) on 05/09/2022 7:16:18 PM  Radiology DG Chest 2 View  Result Date: 05/09/2022 CLINICAL DATA:  Chest pain. EXAM: CHEST - 2 VIEW COMPARISON:  Chest two views 01/12/2022 FINDINGS: Cardiac silhouette and mediastinal contours are within normal limits. The lungs are clear. No pleural effusion or pneumothorax. Minimal multilevel degenerative disc changes of the thoracic spine. Cholecystectomy clips. IMPRESSION: No active cardiopulmonary disease. Electronically Signed   By: Neita Garnet M.D.   On: 05/09/2022 15:39    Procedures Procedures    Medications Ordered in ED Medications - No data to display  ED Course/ Medical Decision Making/ A&P                           Medical Decision Making Risk Prescription drug management.   [name] is here with chest pain, abdominal pain.  Differential diagnosis likely gastritis but could  be pancreatitis seems less likely to be ACS.  She is PERC negative and doubt PE.  No infectious symptoms and doubt pneumonia.  CBC, CMP, lipase, troponin, chest x-ray, EKG obtained. Per my review and interpretation of EKG patient was sinus rhythm.  No ischemic changes.  Troponins negative.  No concern for ACS.  Lipase and other blood work are unremarkable.  No concern for pancreatitis.  Pregnancy test is negative.  Chest x-ray per my review and interpretation shows no  pneumonia or pneumothorax.  Overall suspect that this is stomach/GI related pain.  We will start her on Carafate to go along with her omeprazole.  We will have her follow-up with GI.  Discharged in good condition.  Understands return precautions.  This chart was dictated using voice recognition software.  Despite best efforts to proofread,  errors can occur which can change the documentation meaning.          Final Clinical Impression(s) / ED Diagnoses Final diagnoses:  Nonspecific chest pain    Rx / DC Orders ED Discharge Orders          Ordered    sucralfate (CARAFATE) 1 g tablet  3 times daily with meals & bedtime        05/09/22 2015              Virgina Norfolk, DO 05/09/22 2017

## 2022-06-14 ENCOUNTER — Emergency Department (HOSPITAL_COMMUNITY): Admission: EM | Admit: 2022-06-14 | Discharge: 2022-06-14 | Discharge status: Against Medical Advice | Payer: Self-pay

## 2022-06-14 ENCOUNTER — Encounter (HOSPITAL_COMMUNITY): Payer: Self-pay | Admitting: Emergency Medicine

## 2022-06-14 ENCOUNTER — Ambulatory Visit (HOSPITAL_COMMUNITY)
Admission: EM | Admit: 2022-06-14 | Discharge: 2022-06-14 | Disposition: A | Discharge status: Home / Self Care | Payer: Medicaid Other | Attending: Physician Assistant | Admitting: Physician Assistant

## 2022-06-14 DIAGNOSIS — J40 Bronchitis, not specified as acute or chronic: Secondary | ICD-10-CM

## 2022-06-14 DIAGNOSIS — R051 Acute cough: Secondary | ICD-10-CM

## 2022-06-14 DIAGNOSIS — J069 Acute upper respiratory infection, unspecified: Secondary | ICD-10-CM

## 2022-06-14 MED ORDER — PREDNISONE 10 MG PO TABS: 10.0000 mg | ORAL_TABLET | Freq: Three times a day (TID) | ORAL | 0 refills | Status: DC

## 2022-06-14 MED ORDER — AZITHROMYCIN 250 MG PO TABS: ORAL_TABLET | ORAL | 0 refills | Status: DC

## 2022-06-14 MED ORDER — ALBUTEROL SULFATE HFA 108 (90 BASE) MCG/ACT IN AERS
2.0000 | INHALATION_SPRAY | Freq: Once | RESPIRATORY_TRACT | Status: AC
  Administered 2022-06-14: 2 via RESPIRATORY_TRACT

## 2022-06-14 MED ORDER — ALBUTEROL SULFATE HFA 108 (90 BASE) MCG/ACT IN AERS
INHALATION_SPRAY | RESPIRATORY_TRACT | Status: AC
  Filled 2022-06-14: qty 6.7

## 2022-06-14 NOTE — ED Triage Notes (Signed)
URI symptoms starting 12/6, has improved but has kept a cough and fatigue since.  Donated plasma Thursday, was told her BP was 185/116 and she was having bad headaches around that time. Now she's having occipital headaches every other day, around the time she wakes up.

## 2022-06-14 NOTE — ED Provider Notes (Signed)
[facility]    CSN: [id number] Arrival date & time: 06/14/22  1002      History   Chief Complaint Chief Complaint  Patient presents with   Cough   Headache    HPI Alicia Parks is a 46 y.o. female.   46 year old female presents with cough and congestion.  Patient indicates for the past 2 weeks she has been having persistent chest congestion with cough.  She relates that her chest has been rattling when she has coughing episodes mainly during the day, with activity and sometimes at night.  She relates that the production that she is bringing up is purulent green.  Patient indicates that she is having some mild shortness of breath but no wheezing.  Patient indicates she is not having any fever or chills.  She does indicates she is having some mild upper respiratory congestion with postnasal drip.  Patient indicates she has been using some OTC preparations but this has not improved her symptoms.  Patient indicates that she is tolerating fluids well but she is just concerned that the chest congestion and cough has been going on for little over 2 weeks.  Patient indicates she does not smoke, and no one smokes in the house.   Cough Associated symptoms: headaches   Headache Associated symptoms: cough     Past Medical History:  Diagnosis Date   Back pain    Complication of anesthesia    Ear drum perforation    Fibromyalgia    GERD (gastroesophageal reflux disease)    Migraine    Restless leg syndrome     Patient Active Problem List   Diagnosis Date Noted   Symptomatic cholelithiasis 05/30/2021   Cholecystitis 05/30/2021   Dental abscess 10/04/2020   GERD (gastroesophageal reflux disease) 10/04/2020   Generalized anxiety disorder 04/25/2020   Bipolar I disorder, most recent episode depressed (HCC) 04/25/2020   Moderate episode of recurrent major depressive disorder (HCC) 04/25/2020    Past Surgical History:  Procedure Laterality Date   CHOLECYSTECTOMY N/A  05/30/2021   Procedure: LAPAROSCOPIC CHOLECYSTECTOMY WITH ICG DYE;  Surgeon: Emelia LoronWakefield, Matthew, MD;  Location: [facility];  Service: General;  Laterality: N/A;   TONSILLECTOMY     TUBAL LIGATION      OB History     Gravida  3   Para  3   Term  3   Preterm  0   AB  0   Living  3      SAB  0   IAB  0   Ectopic  0   Multiple  0   Live Births               Home Medications    Prior to Admission medications   Medication Sig Start Date End Date Taking? Authorizing Provider  azithromycin (ZITHROMAX Z-PAK) 250 MG tablet 2 tablets initially, then 1 tablet daily until medication completed. 06/14/22  Yes Ellsworth LennoxJames, Levi Crass, PA-C  predniSONE (DELTASONE) 10 MG tablet Take 1 tablet (10 mg total) by mouth 3 (three) times daily. 06/14/22  Yes Ellsworth LennoxJames, Darwin Guastella, PA-C  diphenhydrAMINE (SOMINEX) 25 MG tablet Take 25 mg by mouth daily as needed for allergies.    [provider]  esomeprazole (NEXIUM) 20 MG packet Take 20 mg by mouth in the morning and at bedtime. 07/08/21   Tomi BambergerMyers, Rebecca F, PA-C  famotidine (PEPCID) 40 MG/5ML suspension Take 2.5 mLs (20 mg total) by mouth 2 (two) times daily. Patient taking differently: Take 40 mg  by mouth 2 (two) times daily. 03/28/21   Wallis Bamberg, PA-C  furosemide (LASIX) 20 MG tablet Take 1 tablet (20 mg total) by mouth daily for 4 days. 01/12/22 01/16/22  Gloris Manchester, MD  hydrOXYzine (ATARAX) 10 MG/5ML syrup Take 12.5 mLs (25 mg total) by mouth 3 (three) times daily as needed for anxiety. Patient not taking: Reported on 04/25/2021 03/25/21   Army Melia A, PA-C  hydrOXYzine (ATARAX/VISTARIL) 25 MG tablet TAKE 1 TABLET (25 MG TOTAL) BY MOUTH 4 (FOUR) TIMES DAILY AS NEEDED. Patient not taking: Reported on 05/30/2021 02/13/21   Shanna Cisco, NP  lidocaine (XYLOCAINE) 2 % solution Use as directed 10 mLs in the mouth or throat every 8 (eight) hours as needed for mouth pain. 06/18/21   Raspet, Erin K, PA-C  ondansetron (ZOFRAN-ODT) 4 MG disintegrating  tablet Take 1 tablet (4 mg total) by mouth every 8 (eight) hours as needed for nausea or vomiting. 06/14/21   Alvira Monday, MD  oxyCODONE (OXY IR/ROXICODONE) 5 MG immediate release tablet Take 1 tablet (5 mg total) by mouth every 4 (four) hours as needed for moderate pain. 05/30/21   [name], MD  promethazine-dextromethorphan (PROMETHAZINE-DM) 6.25-15 MG/5ML syrup Take 5 mLs by mouth 4 (four) times daily as needed for cough. 06/20/21   Merrilee Jansky, MD  sertraline (ZOLOFT) 25 MG tablet TAKE 1 TABLET (25 MG TOTAL) BY MOUTH DAILY. 07/15/21   Shanna Cisco, NP  sucralfate (CARAFATE) 1 g tablet Take 1 tablet (1 g total) by mouth 4 (four) times daily -  with meals and at bedtime for 14 days. 05/09/22 05/23/22  Curatolo, Adam, DO  ursodiol (ACTIGALL) 300 MG capsule Take 1 capsule by mouth three times daily for 30 days. Patient not taking: Reported on 05/30/2021 04/25/21   Ernie Avena, MD    Family History Family History  Problem Relation Age of Onset   Osteoporosis Mother    Hypertension Father    Heart disease Father    Gout Father    Cancer Other     Social History Social History   Tobacco Use   Smoking status: Never   Smokeless tobacco: Never  Vaping Use   Vaping Use: Never used  Substance Use Topics   Alcohol use: No   Drug use: Yes    Types: Marijuana     Allergies   Aspirin, Compazine [prochlorperazine edisylate], Effexor [venlafaxine], Prochlorperazine, and Tylenol [acetaminophen]   Review of Systems Review of Systems  Respiratory:  Positive for cough.   Neurological:  Positive for headaches.     Physical Exam Triage Vital Signs ED Triage Vitals  Enc Vitals Group     BP 06/14/22 1009 (!) 150/97     Pulse Rate 06/14/22 1009 (!) 113     Resp 06/14/22 1009 16     Temp 06/14/22 1009 97.7 F (36.5 C)     Temp Source 06/14/22 1009 Oral     SpO2 06/14/22 1009 98 %     Weight --      Height --      Head Circumference --      Peak Flow --       Pain Score 06/14/22 1015 5     Pain Loc --      Pain Edu? --      Excl. in GC? --    No data found.  Updated Vital Signs BP (!) 150/97 (BP Location: Left Arm)   Pulse (!) 113   Temp 97.7 F (  36.5 C) (Oral)   Resp 16   SpO2 98%   Visual Acuity Right Eye Distance:   Left Eye Distance:   Bilateral Distance:    Right Eye Near:   Left Eye Near:    Bilateral Near:     Physical Exam Constitutional:      Appearance: She is well-developed.  HENT:     Right Ear: Tympanic membrane and ear canal normal.     Left Ear: Tympanic membrane and ear canal normal.     Mouth/Throat:     Mouth: Mucous membranes are moist.     Pharynx: Oropharynx is clear.  Cardiovascular:     Rate and Rhythm: Normal rate and regular rhythm.     Heart sounds: Normal heart sounds.  Pulmonary:     Effort: Pulmonary effort is normal.     Breath sounds: Normal breath sounds and air entry. No wheezing, rhonchi or rales.  Lymphadenopathy:     Cervical: No cervical adenopathy.  Neurological:     Mental Status: She is alert.      UC Treatments / Results  Labs (all labs ordered are listed, but only abnormal results are displayed) Labs Reviewed - No data to display  EKG   Radiology No results found.  Procedures Procedures (including critical care time)  Medications Ordered in UC Medications  albuterol (VENTOLIN HFA) 108 (90 Base) MCG/ACT inhaler 2 puff (has no administration in time range)    Initial Impression / Assessment and Plan / UC Course  I have reviewed the triage vital signs and the nursing notes.  Pertinent labs & imaging results that were available during my care of the patient were reviewed by me and considered in my medical decision making (see chart for details).    Plan: 1.  The upper respiratory tract infection will be treated with the following: A.  Albuterol inhaler, 2 puffs every 6 hours on a regular basis to help clear the cough and chest congestion. 2.  The acute cough  will be treated with the following: A.  Albuterol inhaler, 2 puffs every 6 hours on a regular basis to help clear the cough and chest congestion. 3.  The acute bronchitis will be treated with the following: A.  Prednisone 10 mg 3 times a day for 5 days only to reduce the respiratory inflammatory response. B.  Zithromax 250 mg, 2 tablets initially and then 1 tablet daily until medication is completed to treat the respiratory infection. 4.  Patient advised follow-up PCP or return to urgent care if symptoms fail to improve. Final Clinical Impressions(s) / UC Diagnoses   Final diagnoses:  Viral upper respiratory tract infection  Acute cough  Bronchitis     Discharge Instructions      Advised to use the albuterol inhaler, 2 puffs every 6 hours on a regular basis for the next several days to help clear the chest congestion and cough. Advised to take the prednisone 10 mg 3 times a day for 5 days only to help reduce the respiratory inflammatory process. Advised to take the Zithromax 250 mg, 2 tablets initially and then 1 tablet every day until medication is completed. Advised to increase fluids and to follow-up PCP or return to urgent care if symptoms fail to improve.    ED Prescriptions     Medication Sig Dispense Auth. Provider   azithromycin (ZITHROMAX Z-PAK) 250 MG tablet 2 tablets initially, then 1 tablet daily until medication completed. 6 each Ellsworth Lennox, PA-C   predniSONE (DELTASONE)  10 MG tablet Take 1 tablet (10 mg total) by mouth 3 (three) times daily. 15 tablet Ellsworth Lennox, PA-C      PDMP not reviewed this encounter.   Ellsworth Lennox, PA-C 06/14/22 1032

## 2022-06-14 NOTE — Discharge Instructions (Signed)
Advised to use the albuterol inhaler, 2 puffs every 6 hours on a regular basis for the next several days to help clear the chest congestion and cough. Advised to take the prednisone 10 mg 3 times a day for 5 days only to help reduce the respiratory inflammatory process. Advised to take the Zithromax 250 mg, 2 tablets initially and then 1 tablet every day until medication is completed. Advised to increase fluids and to follow-up PCP or return to urgent care if symptoms fail to improve.

## 2022-07-29 ENCOUNTER — Ambulatory Visit: Payer: Medicaid Other | Admitting: Internal Medicine

## 2022-08-12 ENCOUNTER — Ambulatory Visit: Payer: Medicaid Other | Admitting: Nurse Practitioner

## 2022-08-21 ENCOUNTER — Ambulatory Visit: Payer: Medicaid Other | Admitting: Internal Medicine

## 2022-09-16 ENCOUNTER — Other Ambulatory Visit (HOSPITAL_COMMUNITY): Payer: Self-pay

## 2022-09-16 MED ORDER — VITAMIN D (ERGOCALCIFEROL) 1.25 MG (50000 UNIT) PO CAPS
50000.0000 [IU] | ORAL_CAPSULE | ORAL | 0 refills | Status: DC
  Filled 2022-09-16: qty 12, 84d supply, fill #0

## 2022-09-23 ENCOUNTER — Other Ambulatory Visit (HOSPITAL_COMMUNITY): Payer: Self-pay

## 2022-09-27 ENCOUNTER — Emergency Department (HOSPITAL_COMMUNITY)
Admission: EM | Admit: 2022-09-27 | Discharge: 2022-09-27 | Disposition: A | Discharge status: Home / Self Care | Payer: Commercial Managed Care - HMO | Attending: Emergency Medicine | Admitting: Emergency Medicine

## 2022-09-27 ENCOUNTER — Emergency Department (HOSPITAL_COMMUNITY): Payer: Commercial Managed Care - HMO

## 2022-09-27 ENCOUNTER — Encounter (HOSPITAL_COMMUNITY): Payer: Self-pay

## 2022-09-27 ENCOUNTER — Other Ambulatory Visit: Payer: Self-pay

## 2022-09-27 DIAGNOSIS — R1031 Right lower quadrant pain: Secondary | ICD-10-CM | POA: Diagnosis present

## 2022-09-27 DIAGNOSIS — R109 Unspecified abdominal pain: Secondary | ICD-10-CM

## 2022-09-27 DIAGNOSIS — K429 Umbilical hernia without obstruction or gangrene: Secondary | ICD-10-CM | POA: Insufficient documentation

## 2022-09-27 DIAGNOSIS — K760 Fatty (change of) liver, not elsewhere classified: Secondary | ICD-10-CM | POA: Diagnosis not present

## 2022-09-27 LAB — CBC WITH DIFFERENTIAL/PLATELET
Abs Immature Granulocytes: 0.07 10*3/uL (ref 0.00–0.07)
Basophils Absolute: 0.1 10*3/uL (ref 0.0–0.1)
Basophils Relative: 0 %
Eosinophils Absolute: 0.2 10*3/uL (ref 0.0–0.5)
Eosinophils Relative: 1 %
HCT: 42.8 % (ref 36.0–46.0)
Hemoglobin: 14.3 g/dL (ref 12.0–15.0)
Immature Granulocytes: 0 %
Lymphocytes Relative: 19 %
Lymphs Abs: 3.3 10*3/uL (ref 0.7–4.0)
MCH: 30.4 pg (ref 26.0–34.0)
MCHC: 33.4 g/dL (ref 30.0–36.0)
MCV: 90.9 fL (ref 80.0–100.0)
Monocytes Absolute: 0.8 10*3/uL (ref 0.1–1.0)
Monocytes Relative: 5 %
Neutro Abs: 13.4 10*3/uL — ABNORMAL HIGH (ref 1.7–7.7)
Neutrophils Relative %: 75 %
Platelets: 444 10*3/uL — ABNORMAL HIGH (ref 150–400)
RBC: 4.71 MIL/uL (ref 3.87–5.11)
RDW: 13.4 % (ref 11.5–15.5)
WBC: 17.9 10*3/uL — ABNORMAL HIGH (ref 4.0–10.5)
nRBC: 0 % (ref 0.0–0.2)

## 2022-09-27 LAB — URINALYSIS, ROUTINE W REFLEX MICROSCOPIC
Bilirubin Urine: NEGATIVE
Glucose, UA: NEGATIVE mg/dL
Hgb urine dipstick: NEGATIVE
Ketones, ur: NEGATIVE mg/dL
Leukocytes,Ua: NEGATIVE
Nitrite: NEGATIVE
Protein, ur: NEGATIVE mg/dL
Specific Gravity, Urine: 1.006 (ref 1.005–1.030)
pH: 5 (ref 5.0–8.0)

## 2022-09-27 LAB — LIPASE, BLOOD: Lipase: 44 U/L (ref 11–51)

## 2022-09-27 LAB — I-STAT BETA HCG BLOOD, ED (MC, WL, AP ONLY): I-stat hCG, quantitative: <5 m[IU]/mL (ref <5)

## 2022-09-27 LAB — COMPREHENSIVE METABOLIC PANEL
ALT: 35 U/L (ref 0–44)
AST: 28 U/L (ref 15–41)
Albumin: 4.1 g/dL (ref 3.5–5.0)
Alkaline Phosphatase: 56 U/L (ref 38–126)
Anion gap: 6 (ref 5–15)
BUN: 8 mg/dL (ref 6–20)
CO2: 24 mmol/L (ref 22–32)
Calcium: 8.6 mg/dL — ABNORMAL LOW (ref 8.9–10.3)
Chloride: 108 mmol/L (ref 98–111)
Creatinine, Ser: 0.62 mg/dL (ref 0.44–1.00)
GFR, Estimated: >60 mL/min (ref >60)
Glucose, Bld: 118 mg/dL — ABNORMAL HIGH (ref 70–99)
Potassium: 3.6 mmol/L (ref 3.5–5.1)
Sodium: 138 mmol/L (ref 135–145)
Total Bilirubin: 0.6 mg/dL (ref 0.3–1.2)
Total Protein: 7.5 g/dL (ref 6.5–8.1)

## 2022-09-27 MED ORDER — IOHEXOL 300 MG/ML  SOLN
100.0000 mL | Freq: Once | INTRAMUSCULAR | Status: AC | PRN
  Administered 2022-09-27: 100 mL via INTRAVENOUS

## 2022-09-27 MED ORDER — ONDANSETRON HCL 4 MG/2ML IJ SOLN
4.0000 mg | Freq: Once | INTRAMUSCULAR | Status: AC
  Administered 2022-09-27: 4 mg via INTRAVENOUS
  Filled 2022-09-27: qty 2

## 2022-09-27 MED ORDER — HYOSCYAMINE SULFATE SL 0.125 MG SL SUBL: 0.1250 mg | SUBLINGUAL_TABLET | SUBLINGUAL | 0 refills | Status: DC | PRN

## 2022-09-27 MED ORDER — ONDANSETRON HCL 4 MG PO TABS: 4.0000 mg | ORAL_TABLET | Freq: Three times a day (TID) | ORAL | 0 refills | Status: DC | PRN

## 2022-09-27 MED ORDER — FENTANYL CITRATE PF 50 MCG/ML IJ SOSY
50.0000 ug | PREFILLED_SYRINGE | Freq: Once | INTRAMUSCULAR | Status: AC
  Administered 2022-09-27: 50 ug via INTRAVENOUS
  Filled 2022-09-27: qty 1

## 2022-09-27 NOTE — ED Triage Notes (Signed)
Patient is here for evaluation of right lower quadrant pain. Reports it started about an hour ago. Reports nausea and vomiting today. Denies any bowel or bladder complaints.

## 2022-09-27 NOTE — ED Provider Notes (Signed)
[name] Provider Note   CSN: [id number] Arrival date & time: 09/27/22  7793     History {Add pertinent medical, surgical, social history, OB history to HPI:1} Chief Complaint  Patient presents with   Abdominal Pain    Alicia Parks is a 47 y.o. female.  The history is provided by the patient.  Abdominal Pain Pain location:  RLQ, RUQ and periumbilical Pain quality: aching, bloating, sharp and stabbing   Pain radiates to:  R flank Pain severity:  Severe Onset quality:  Sudden Duration:  3 hours Timing:  Constant Progression:  Waxing and waning Chronicity:  New Context: awakening from sleep   Relieved by:  Nothing Exacerbated by: pepto bismol. Ineffective treatments: pepto bismol. Associated symptoms: anorexia, flatus, nausea and vomiting   Associated symptoms: no belching, no chest pain, no chills, no constipation, no cough, no diarrhea, no dysuria, no hematemesis, no hematuria, no melena, no vaginal bleeding and no vaginal discharge   Risk factors: multiple surgeries   Risk factors: no alcohol abuse      Sudden onset R sided abd pain that woke her from sleep this morning. S/p cholecystectomy and BL salpingectomy. Pepto without relief. No hx of same. No hx of kidney stones or sbo.  Home Medications Prior to Admission medications   Medication Sig Start Date End Date Taking? Authorizing Provider  azithromycin (ZITHROMAX Z-PAK) 250 MG tablet 2 tablets initially, then 1 tablet daily until medication completed. 06/14/22   Ellsworth Lennox, PA-C  diphenhydrAMINE (SOMINEX) 25 MG tablet Take 25 mg by mouth daily as needed for allergies.    [provider]  esomeprazole (NEXIUM) 20 MG packet Take 20 mg by mouth in the morning and at bedtime. 07/08/21   Tomi Bamberger, PA-C  famotidine (PEPCID) 40 MG/5ML suspension Take 2.5 mLs (20 mg total) by mouth 2 (two) times daily. Patient taking differently: Take 40 mg by mouth 2 (two)  times daily. 03/28/21   Wallis Bamberg, PA-C  furosemide (LASIX) 20 MG tablet Take 1 tablet (20 mg total) by mouth daily for 4 days. 01/12/22 01/16/22  Gloris Manchester, MD  hydrOXYzine (ATARAX) 10 MG/5ML syrup Take 12.5 mLs (25 mg total) by mouth 3 (three) times daily as needed for anxiety. Patient not taking: Reported on 04/25/2021 03/25/21   Army Melia A, PA-C  hydrOXYzine (ATARAX/VISTARIL) 25 MG tablet TAKE 1 TABLET (25 MG TOTAL) BY MOUTH 4 (FOUR) TIMES DAILY AS NEEDED. Patient not taking: Reported on 05/30/2021 02/13/21   Shanna Cisco, NP  lidocaine (XYLOCAINE) 2 % solution Use as directed 10 mLs in the mouth or throat every 8 (eight) hours as needed for mouth pain. 06/18/21   Raspet, Erin K, PA-C  ondansetron (ZOFRAN-ODT) 4 MG disintegrating tablet Take 1 tablet (4 mg total) by mouth every 8 (eight) hours as needed for nausea or vomiting. 06/14/21   Alvira Monday, MD  oxyCODONE (OXY IR/ROXICODONE) 5 MG immediate release tablet Take 1 tablet (5 mg total) by mouth every 4 (four) hours as needed for moderate pain. 05/30/21   [name], MD  predniSONE (DELTASONE) 10 MG tablet Take 1 tablet (10 mg total) by mouth 3 (three) times daily. 06/14/22   Ellsworth Lennox, PA-C  promethazine-dextromethorphan (PROMETHAZINE-DM) 6.25-15 MG/5ML syrup Take 5 mLs by mouth 4 (four) times daily as needed for cough. 06/20/21   Merrilee Jansky, MD  sertraline (ZOLOFT) 25 MG tablet TAKE 1 TABLET (25 MG TOTAL) BY MOUTH DAILY. 07/15/21   Doyne Keel,  Brittney E, NP  sucralfate (CARAFATE) 1 g tablet Take 1 tablet (1 g total) by mouth 4 (four) times daily -  with meals and at bedtime for 14 days. 05/09/22 05/23/22  Curatolo, Adam, DO  ursodiol (ACTIGALL) 300 MG capsule Take 1 capsule by mouth three times daily for 30 days. Patient not taking: Reported on 05/30/2021 04/25/21   Ernie AvenaLawsing, James, MD  Vitamin D, Ergocalciferol, (DRISDOL) 1.25 MG (50000 UNIT) CAPS capsule Take 1 capsule (50,000 Units total) by mouth once a week at 9am  for 12 doses 09/16/22         Allergies    Aspirin, Compazine [prochlorperazine edisylate], Effexor [venlafaxine], Prochlorperazine, and Tylenol [acetaminophen]    Review of Systems   Review of Systems  Constitutional:  Negative for chills.  Respiratory:  Negative for cough.   Cardiovascular:  Negative for chest pain.  Gastrointestinal:  Positive for abdominal pain, anorexia, flatus, nausea and vomiting. Negative for constipation, diarrhea, hematemesis and melena.  Genitourinary:  Negative for dysuria, hematuria, vaginal bleeding and vaginal discharge.    Physical Exam Updated Vital Signs BP 132/71   Pulse 73   Temp 97.6 F (36.4 C) (Oral)   Resp 18   Ht 5\' 4"  (1.626 m)   Wt 96.2 kg   SpO2 97%   BMI 36.40 kg/m  Physical Exam Vitals and nursing note reviewed.  Constitutional:      General: She is not in acute distress.    Appearance: She is well-developed. She is not diaphoretic.     Comments: Appears uncomfotable  HENT:     Head: Normocephalic and atraumatic.     Right Ear: External ear normal.     Left Ear: External ear normal.     Nose: Nose normal.     Mouth/Throat:     Mouth: Mucous membranes are moist.  Eyes:     General: No scleral icterus.    Conjunctiva/sclera: Conjunctivae normal.  Cardiovascular:     Rate and Rhythm: Normal rate and regular rhythm.     Heart sounds: Normal heart sounds. No murmur heard.    No friction rub. No gallop.  Pulmonary:     Effort: Pulmonary effort is normal. No respiratory distress.     Breath sounds: Normal breath sounds.  Abdominal:     General: Bowel sounds are normal. There is no distension.     Palpations: Abdomen is soft. There is no mass.     Tenderness: There is abdominal tenderness in the right upper quadrant and right lower quadrant. There is no guarding.     Hernia: A hernia is present. Hernia is present in the umbilical area (firm, tender umbilical hernia without color change).  Musculoskeletal:     Cervical back:  Normal range of motion.  Skin:    General: Skin is warm and dry.  Neurological:     Mental Status: She is alert and oriented to person, place, and time.  Psychiatric:        Behavior: Behavior normal.     ED Results / Procedures / Treatments   Labs (all labs ordered are listed, but only abnormal results are displayed) Labs Reviewed  CBC WITH DIFFERENTIAL/PLATELET  COMPREHENSIVE METABOLIC PANEL  LIPASE, BLOOD  URINALYSIS, ROUTINE W REFLEX MICROSCOPIC  I-STAT BETA HCG BLOOD, ED ([facility], [facility], [facility] ONLY)    EKG None  Radiology No results found.  Procedures Procedures  {Document cardiac monitor, telemetry assessment procedure when appropriate:1}  Medications Ordered in ED Medications  fentaNYL (SUBLIMAZE) injection 50  mcg (50 mcg Intravenous Given 09/27/22 0833)  ondansetron (ZOFRAN) injection 4 mg (4 mg Intravenous Given 09/27/22 8099)    ED Course/ Medical Decision Making/ A&P   {   Click here for ABCD2, HEART and other calculatorsREFRESH Note before signing :1}                          Medical Decision Making Amount and/or Complexity of Data Reviewed Labs: ordered. Radiology: ordered.  Risk Prescription drug management.   ***  {Document critical care time when appropriate:1} {Document review of labs and clinical decision tools ie heart score, Chads2Vasc2 etc:1}  {Document your independent review of radiology images, and any outside records:1} {Document your discussion with family members, caretakers, and with consultants:1} {Document social determinants of health affecting pt's care:1} {Document your decision making why or why not admission, treatments were needed:1} Final Clinical Impression(s) / ED Diagnoses Final diagnoses:  None    Rx / DC Orders ED Discharge Orders     None

## 2022-09-27 NOTE — Discharge Instructions (Addendum)
HERNIA Contact a health care provider if: Your hernia gets larger. Your hernia becomes painful. Get help right away if: You develop sudden, severe pain near the area of your hernia. You have pain as well as nausea or vomiting. You have pain and the skin over your hernia changes color. You develop a fever or chills.  ABDOMINAL PAIN Abdominal (belly) pain can be caused by many things. Your caregiver performed an examination and possibly ordered blood/urine tests and imaging (CT scan, x-rays, ultrasound). Many cases can be observed and treated at home after initial evaluation in the emergency department. Even though you are being discharged home, abdominal pain can be unpredictable. Therefore, you need a repeated exam if your pain does not resolve, returns, or worsens. Most patients with abdominal pain don't have to be admitted to the hospital or have surgery, but serious problems like appendicitis and gallbladder attacks can start out as nonspecific pain. Many abdominal conditions cannot be diagnosed in one visit, so follow-up evaluations are very important. SEEK IMMEDIATE MEDICAL ATTENTION IF: The pain does not go away or becomes severe.  A temperature above 101 develops.  Repeated vomiting occurs (multiple episodes).  The pain becomes localized to portions of the abdomen. The right side could possibly be appendicitis. In an adult, the left lower portion of the abdomen could be colitis or diverticulitis.  Blood is being passed in stools or vomit (bright red or black tarry stools).  Return also if you develop chest pain, difficulty breathing, dizziness or fainting, or become confused, poorly responsive, or inconsolable (young children).

## 2022-12-15 ENCOUNTER — Ambulatory Visit: Payer: Medicaid Other | Admitting: Nurse Practitioner

## 2022-12-26 ENCOUNTER — Ambulatory Visit: Payer: Commercial Managed Care - HMO | Admitting: Internal Medicine

## 2023-03-02 ENCOUNTER — Ambulatory Visit: Payer: Self-pay

## 2023-03-02 NOTE — Telephone Encounter (Signed)
  Chief Complaint: Reflux Symptoms: Chronic reflux, worsening "Since 2022" Frequency: Chronic Pertinent Negatives: Patient denies SOB, pain does not radiate, sweating. Disposition: [] ED /[] Urgent Care (no appt availability in office) / [x] Appointment(In office/virtual)/ []  [facility]/ [] Home Care/ [] Refused Recommended Disposition /[] [facility]/ []  Follow-up with PCP Additional Notes: Pt called for NP appt. Secured by agent. Acute appt secured for DB 03/05/23. Care advise provided, pt verbalizes understanding.  Reason for Disposition  [1] Patient says chest pain feels exactly the same as previously diagnosed "heartburn" AND [2] describes burning in chest AND [3] accompanying sour taste in mouth  Answer Assessment - Initial Assessment Questions 1. LOCATION: "Where does it hurt?"       Middle, burning 2. RADIATION: "Does the pain go anywhere else?" (e.g., into neck, jaw, arms, back)     No 3. ONSET: "When did the chest pain begin?" (Minutes, hours or days)      Chronic, 2022 worsening 4. PATTERN: "Does the pain come and go, or has it been constant since it started?"  "Does it get worse with exertion?"      Comes and goes 5. DURATION: "How long does it last" (e.g., seconds, minutes, hours)     Varies 6. SEVERITY: "How bad is the pain?"  (e.g., Scale 1-10; mild, moderate, or severe)    - MILD (1-3): doesn't interfere with normal activities     - MODERATE (4-7): interferes with normal activities or awakens from sleep    - SEVERE (8-10): excruciating pain, unable to do any normal activities       10/10 7. CARDIAC RISK FACTORS: "Do you have any history of heart problems or risk factors for heart disease?" (e.g., angina, prior heart attack; diabetes, high blood pressure, high cholesterol, smoker, or strong family history of heart disease)      8. PULMONARY RISK FACTORS: "Do you have any history of lung disease?"  (e.g., blood clots in lung, asthma, emphysema, birth control  pills)      9. CAUSE: "What do you think is causing the chest pain?"     Reflux 10. OTHER SYMPTOMS: "Do you have any other symptoms?" (e.g., dizziness, nausea, vomiting, sweating, fever, difficulty breathing, cough)      Acid ,vomiting up acid.  Protocols used: Chest Pain-A-AH

## 2023-03-02 NOTE — Telephone Encounter (Signed)
Message from De Blanch sent at 03/02/2023  2:38 PM EDT  Summary: heartburn and acid reflux.  bad episode last night, which caused her chest to feel sore today.   Pt stated she is experiencing heartburn and a lot of acid reflux. She had a bad episode last night, which caused her chest to feel sore today.Mentioned the medication she is taking is not helping her. She has contacted her PCP but has did not received any assistance, so she picked up Sucrosomial Magnesium and called in to schedule a new patient appointment at [facility] on 10/28.  Pt seeking clinical advice to get her through until her appointment.        Called pt and LM on VM to call back to [phone].

## 2023-03-04 NOTE — Progress Notes (Deleted)
New Patient Office Visit  Subjective:   Alicia Parks April 14, 1976 03/05/2023  No chief complaint on file.   HPI: Alicia Parks presents today to establish care at [facility] at [facility] Introduced to Publishing rights manager role and practice setting.  All questions answered.   Last PCP: *** Last annual physical: *** Concerns: See below    The following portions of the patient's history were reviewed and updated as appropriate: past medical history, past surgical history, family history, social history, allergies, medications, and problem list.   Patient Active Problem List   Diagnosis Date Noted   Symptomatic cholelithiasis 05/30/2021   Cholecystitis 05/30/2021   Dental abscess 10/04/2020   GERD (gastroesophageal reflux disease) 10/04/2020   Generalized anxiety disorder 04/25/2020   Bipolar I disorder, most recent episode depressed (HCC) 04/25/2020   Moderate episode of recurrent major depressive disorder (HCC) 04/25/2020   Past Medical History:  Diagnosis Date   Back pain    Complication of anesthesia    Ear drum perforation    Fibromyalgia    GERD (gastroesophageal reflux disease)    Migraine    Restless leg syndrome    Past Surgical History:  Procedure Laterality Date   CHOLECYSTECTOMY N/A 05/30/2021   Procedure: LAPAROSCOPIC CHOLECYSTECTOMY WITH ICG DYE;  Surgeon: [name], MD;  Location: [facility] OR;  Service: General;  Laterality: N/A;   TONSILLECTOMY     TUBAL LIGATION     Family History  Problem Relation Age of Onset   Osteoporosis Mother    Hypertension Father    Heart disease Father    Gout Father    Cancer Other    Social History   Socioeconomic History   Marital status: Married    Spouse name: Not on file   Number of children: Not on file   Years of education: Not on file   Highest education level: Associate degree: occupational, Scientist, product/process development, or vocational program  Occupational History   Not on  file  Tobacco Use   Smoking status: Never   Smokeless tobacco: Never  Vaping Use   Vaping status: Never Used  Substance and Sexual Activity   Alcohol use: No   Drug use: Yes    Types: Marijuana   Sexual activity: Yes    Birth control/protection: Surgical  Other Topics Concern   Not on file  Social History Narrative   Not on file   Social Determinants of Health   Financial Resource Strain: High Risk (03/02/2023)   Overall Financial Resource Strain (CARDIA)    Difficulty of Paying Living Expenses: Very hard  Food Insecurity: Food Insecurity Present (03/02/2023)   Hunger Vital Sign    Worried About Running Out of Food in the Last Year: Often true    Ran Out of Food in the Last Year: Often true  Transportation Needs: Unmet Transportation Needs (03/02/2023)   PRAPARE - Administrator, Civil Service (Medical): Yes    Lack of Transportation (Non-Medical): No  Physical Activity: Insufficiently Active (03/02/2023)   Exercise Vital Sign    Days of Exercise per Week: 2 days    Minutes of Exercise per Session: 10 min  Stress: Stress Concern Present (03/02/2023)   [employer] of Occupational Health - Occupational Stress Questionnaire    Feeling of Stress : Very much  Social Connections: Socially Isolated (03/02/2023)   Social Connection and Isolation Panel [NHANES]    Frequency of Communication with Friends and Family: Never  Frequency of Social Gatherings with Friends and Family: Never    Attends Religious Services: Never    Database administrator or Organizations: No    Attends Engineer, structural: Not on file    Marital Status: Living with partner  Intimate Partner Violence: Unknown (09/27/2021)   Received from [employer], [facility]   HITS    Physically Hurt: Not on file    Insult or Talk Down To: Not on file    Threaten Physical Harm: Not on file    Scream or Curse: Not on file   Outpatient Medications Prior to Visit  Medication Sig Dispense Refill    azithromycin (ZITHROMAX Z-PAK) 250 MG tablet 2 tablets initially, then 1 tablet daily until medication completed. 6 each 0   diphenhydrAMINE (SOMINEX) 25 MG tablet Take 25 mg by mouth daily as needed for allergies.     esomeprazole (NEXIUM) 20 MG packet Take 20 mg by mouth in the morning and at bedtime. 60 each 0   famotidine (PEPCID) 40 MG/5ML suspension Take 2.5 mLs (20 mg total) by mouth 2 (two) times daily. (Patient taking differently: Take 40 mg by mouth 2 (two) times daily.) 100 mL 0   furosemide (LASIX) 20 MG tablet Take 1 tablet (20 mg total) by mouth daily for 4 days. 4 tablet 0   hydrOXYzine (ATARAX) 10 MG/5ML syrup Take 12.5 mLs (25 mg total) by mouth 3 (three) times daily as needed for anxiety. (Patient not taking: Reported on 04/25/2021) 240 mL 0   hydrOXYzine (ATARAX/VISTARIL) 25 MG tablet TAKE 1 TABLET (25 MG TOTAL) BY MOUTH 4 (FOUR) TIMES DAILY AS NEEDED. (Patient not taking: Reported on 05/30/2021) 90 tablet 3   Hyoscyamine Sulfate SL (LEVSIN/SL) 0.125 MG SUBL Place 1 tablet (0.125 mg total) under the tongue every 4 (four) hours as needed (pain). Up to 1.25 mg daily 20 tablet 0   lidocaine (XYLOCAINE) 2 % solution Use as directed 10 mLs in the mouth or throat every 8 (eight) hours as needed for mouth pain. 100 mL 0   ondansetron (ZOFRAN) 4 MG tablet Take 1 tablet (4 mg total) by mouth every 8 (eight) hours as needed for nausea or vomiting. 10 tablet 0   ondansetron (ZOFRAN-ODT) 4 MG disintegrating tablet Take 1 tablet (4 mg total) by mouth every 8 (eight) hours as needed for nausea or vomiting. 20 tablet 0   oxyCODONE (OXY IR/ROXICODONE) 5 MG immediate release tablet Take 1 tablet (5 mg total) by mouth every 4 (four) hours as needed for moderate pain. 10 tablet 0   predniSONE (DELTASONE) 10 MG tablet Take 1 tablet (10 mg total) by mouth 3 (three) times daily. 15 tablet 0   promethazine-dextromethorphan (PROMETHAZINE-DM) 6.25-15 MG/5ML syrup Take 5 mLs by mouth 4 (four) times daily as  needed for cough. 118 mL 0   sertraline (ZOLOFT) 25 MG tablet TAKE 1 TABLET (25 MG TOTAL) BY MOUTH DAILY. 90 tablet 2   sucralfate (CARAFATE) 1 g tablet Take 1 tablet (1 g total) by mouth 4 (four) times daily -  with meals and at bedtime for 14 days. 56 tablet 0   ursodiol (ACTIGALL) 300 MG capsule Take 1 capsule by mouth three times daily for 30 days. (Patient not taking: Reported on 05/30/2021) 90 capsule 0   Vitamin D, Ergocalciferol, (DRISDOL) 1.25 MG (50000 UNIT) CAPS capsule Take 1 capsule (50,000 Units total) by mouth once a week at 9am for 12 doses 12 capsule 0   No facility-administered medications  prior to visit.   Allergies  Allergen Reactions   Aspirin Other (See Comments)    "gives me the jitters"   Compazine [Prochlorperazine Edisylate] Other (See Comments)    Makes body move funny. Shaking   Effexor [Venlafaxine]     Nightmares and night sweats   Prochlorperazine     Other reaction(s): Body shakes   Tylenol [Acetaminophen] Nausea Only and Rash    ROS: A complete ROS was performed with pertinent positives/negatives noted in the HPI. The remainder of the ROS are negative.   Objective:   There were no vitals filed for this visit.  GENERAL: Well-appearing, in NAD. Well nourished.  SKIN: Pink, warm and dry. No rash, lesion, ulceration, or ecchymoses.  Head: Normocephalic. NECK: Trachea midline. Full ROM w/o pain or tenderness. No lymphadenopathy.  EARS: Tympanic membranes are intact, translucent without bulging and without drainage. Appropriate landmarks visualized.  EYES: Conjunctiva clear without exudates. EOMI, PERRL, no drainage present.  NOSE: Septum midline w/o deformity. Nares patent, mucosa pink and non-inflamed w/o drainage. No sinus tenderness.  THROAT: Uvula midline. Oropharynx clear. Tonsils non-inflamed without exudate. Mucous membranes pink and moist.  RESPIRATORY: Chest wall symmetrical. Respirations even and non-labored. Breath sounds clear to auscultation  bilaterally.  CARDIAC: S1, S2 present, regular rate and rhythm without murmur or gallops. Peripheral pulses 2+ bilaterally.  MSK: Muscle tone and strength appropriate for age. Joints w/o tenderness, redness, or swelling.  EXTREMITIES: Without clubbing, cyanosis, or edema.  NEUROLOGIC: No motor or sensory deficits. Steady, even gait. C2-C12 intact.  PSYCH/MENTAL STATUS: Alert, oriented x 3. Cooperative, appropriate mood and affect.    Health Maintenance Due  Topic Date Due   Hepatitis C Screening  Never done   DTaP/Tdap/Td (1 - Tdap) Never done   PAP SMEAR-Modifier  Never done   Colonoscopy  Never done   INFLUENZA VACCINE  Never done   COVID-19 Vaccine (1 - 2023-24 season) Never done    No results found for any visits on 03/05/23.     Assessment & Plan:  There are no diagnoses linked to this encounter.    Patient to reach out to office if new, worrisome, or unresolved symptoms arise or if no improvement in patient's condition. Patient verbalized understanding and is agreeable to treatment plan. All questions answered to patient's satisfaction.    No follow-ups on file.   Of note, portions of this note may have been created with voice recognition software Physicist, medical). While this note has been edited for accuracy, occasional wrong-word or 'sound-a-like' substitutions may have occurred due to the inherent limitations of voice recognition software.  Yolanda Manges, FNP

## 2023-03-05 ENCOUNTER — Ambulatory Visit (HOSPITAL_BASED_OUTPATIENT_CLINIC_OR_DEPARTMENT_OTHER): Payer: Commercial Managed Care - HMO | Admitting: Family Medicine

## 2023-03-05 ENCOUNTER — Encounter (HOSPITAL_BASED_OUTPATIENT_CLINIC_OR_DEPARTMENT_OTHER): Payer: Self-pay

## 2023-03-05 ENCOUNTER — Ambulatory Visit (HOSPITAL_COMMUNITY)
Admission: EM | Admit: 2023-03-05 | Discharge: 2023-03-05 | Disposition: A | Discharge status: Home / Self Care | Payer: Commercial Managed Care - HMO

## 2023-03-05 ENCOUNTER — Emergency Department (HOSPITAL_COMMUNITY)
Admission: EM | Admit: 2023-03-05 | Discharge: 2023-03-05 | Discharge status: Against Medical Advice | Payer: Commercial Managed Care - HMO | Attending: Emergency Medicine | Admitting: Emergency Medicine

## 2023-03-05 ENCOUNTER — Encounter (HOSPITAL_COMMUNITY): Payer: Self-pay

## 2023-03-05 DIAGNOSIS — R079 Chest pain, unspecified: Secondary | ICD-10-CM | POA: Insufficient documentation

## 2023-03-05 DIAGNOSIS — J069 Acute upper respiratory infection, unspecified: Secondary | ICD-10-CM | POA: Diagnosis not present

## 2023-03-05 DIAGNOSIS — Z5321 Procedure and treatment not carried out due to patient leaving prior to being seen by health care provider: Secondary | ICD-10-CM | POA: Diagnosis not present

## 2023-03-05 NOTE — ED Triage Notes (Signed)
Pt presents with nasal congestion and spitting up green mucous. Pt started taking Protonix yesterday.

## 2023-03-05 NOTE — Discharge Instructions (Addendum)
Daily zyrtec Daily flonase or nasal saline Mucinex twice daily (with lots of fluids)

## 2023-03-05 NOTE — ED Notes (Signed)
Patient called x3 for triage and no answer.

## 2023-03-05 NOTE — ED Provider Notes (Signed)
[facility]    CSN: [id number] Arrival date & time: 03/05/23  1857      History   Chief Complaint Chief Complaint  Patient presents with   Cough   Nasal Congestion    HPI Alicia Parks is a 47 y.o. female.  A couple days of nasal congestion, sinus drainage, cough Some pressure in ears No fever No sore throat, shortness of breath, chest pain or pressure Unknown sick contacts   Past Medical History:  Diagnosis Date   Back pain    Complication of anesthesia    Ear drum perforation    Fibromyalgia    GERD (gastroesophageal reflux disease)    Migraine    Restless leg syndrome     Patient Active Problem List   Diagnosis Date Noted   Symptomatic cholelithiasis 05/30/2021   Cholecystitis 05/30/2021   Dental abscess 10/04/2020   GERD (gastroesophageal reflux disease) 10/04/2020   Generalized anxiety disorder 04/25/2020   Bipolar I disorder, most recent episode depressed (HCC) 04/25/2020   Moderate episode of recurrent major depressive disorder (HCC) 04/25/2020    Past Surgical History:  Procedure Laterality Date   CHOLECYSTECTOMY N/A 05/30/2021   Procedure: LAPAROSCOPIC CHOLECYSTECTOMY WITH ICG DYE;  Surgeon: [name], MD;  Location: [facility] OR;  Service: General;  Laterality: N/A;   TONSILLECTOMY     TUBAL LIGATION      OB History     Gravida  3   Para  3   Term  3   Preterm  0   AB  0   Living  3      SAB  0   IAB  0   Ectopic  0   Multiple  0   Live Births               Home Medications    Prior to Admission medications   Medication Sig Start Date End Date Taking? Authorizing Provider  hydrOXYzine (ATARAX) 10 MG/5ML syrup Take 12.5 mLs (25 mg total) by mouth 3 (three) times daily as needed for anxiety. 03/25/21  Yes Jeannie Fend, PA-C  hydrOXYzine (ATARAX/VISTARIL) 25 MG tablet TAKE 1 TABLET (25 MG TOTAL) BY MOUTH 4 (FOUR) TIMES DAILY AS NEEDED. 02/13/21  Yes Toy Cookey E, NP  Hyoscyamine Sulfate SL  (LEVSIN/SL) 0.125 MG SUBL Place 1 tablet (0.125 mg total) under the tongue every 4 (four) hours as needed (pain). Up to 1.25 mg daily 09/27/22  Yes Harris, Abigail, PA-C  ondansetron (ZOFRAN) 4 MG tablet Take 1 tablet (4 mg total) by mouth every 8 (eight) hours as needed for nausea or vomiting. 09/27/22  Yes Harris, Abigail, PA-C  ondansetron (ZOFRAN-ODT) 4 MG disintegrating tablet Take 1 tablet (4 mg total) by mouth every 8 (eight) hours as needed for nausea or vomiting. 06/14/21  Yes Alvira Monday, MD  oxyCODONE (OXY IR/ROXICODONE) 5 MG immediate release tablet Take 1 tablet (5 mg total) by mouth every 4 (four) hours as needed for moderate pain. 05/30/21  Yes [name], MD  predniSONE (DELTASONE) 10 MG tablet Take 1 tablet (10 mg total) by mouth 3 (three) times daily. 06/14/22  Yes Ellsworth Lennox, PA-C  promethazine-dextromethorphan (PROMETHAZINE-DM) 6.25-15 MG/5ML syrup Take 5 mLs by mouth 4 (four) times daily as needed for cough. 06/20/21  Yes Lamptey, Britta Mccreedy, MD  Vitamin D, Ergocalciferol, (DRISDOL) 1.25 MG (50000 UNIT) CAPS capsule Take 1 capsule (50,000 Units total) by mouth once a week at 9am for 12 doses 09/16/22  Yes  azithromycin (ZITHROMAX Z-PAK) 250 MG tablet 2 tablets initially, then 1 tablet daily until medication completed. 06/14/22   Ellsworth Lennox, PA-C  diphenhydrAMINE (SOMINEX) 25 MG tablet Take 25 mg by mouth daily as needed for allergies.    [provider]  esomeprazole (NEXIUM) 20 MG packet Take 20 mg by mouth in the morning and at bedtime. 07/08/21   Tomi Bamberger, PA-C  famotidine (PEPCID) 40 MG/5ML suspension Take 2.5 mLs (20 mg total) by mouth 2 (two) times daily. Patient taking differently: Take 40 mg by mouth 2 (two) times daily. 03/28/21   Wallis Bamberg, PA-C  furosemide (LASIX) 20 MG tablet Take 1 tablet (20 mg total) by mouth daily for 4 days. 01/12/22 01/16/22  Gloris Manchester, MD  lidocaine (XYLOCAINE) 2 % solution Use as directed 10 mLs in the mouth or throat  every 8 (eight) hours as needed for mouth pain. 06/18/21   Raspet, Denny Peon K, PA-C  sertraline (ZOLOFT) 25 MG tablet TAKE 1 TABLET (25 MG TOTAL) BY MOUTH DAILY. 07/15/21   Shanna Cisco, NP  sucralfate (CARAFATE) 1 g tablet Take 1 tablet (1 g total) by mouth 4 (four) times daily -  with meals and at bedtime for 14 days. 05/09/22 05/23/22  Curatolo, Adam, DO  ursodiol (ACTIGALL) 300 MG capsule Take 1 capsule by mouth three times daily for 30 days. Patient not taking: Reported on 05/30/2021 04/25/21   Ernie Avena, MD    Family History Family History  Problem Relation Age of Onset   Osteoporosis Mother    Hypertension Father    Heart disease Father    Gout Father    Cancer Other     Social History Social History   Tobacco Use   Smoking status: Never   Smokeless tobacco: Never  Vaping Use   Vaping status: Never Used  Substance Use Topics   Alcohol use: No   Drug use: Yes    Types: Marijuana     Allergies   Aspirin, Compazine [prochlorperazine edisylate], Effexor [venlafaxine], Prochlorperazine, and Tylenol [acetaminophen]   Review of Systems Review of Systems Per HPI  Physical Exam Triage Vital Signs ED Triage Vitals [03/05/23 1942]  Encounter Vitals Group     BP 136/85     Systolic BP Percentile      Diastolic BP Percentile      Pulse Rate 93     Resp 16     Temp 98.6 F (37 C)     Temp Source Oral     SpO2 98 %     Weight      Height      Head Circumference      Peak Flow      Pain Score      Pain Loc      Pain Education      Exclude from Growth Chart    No data found.  Updated Vital Signs BP 136/85 (BP Location: Left Arm)   Pulse 93   Temp 98.6 F (37 C) (Oral)   Resp 16   SpO2 98%    Physical Exam Vitals and nursing note reviewed.  Constitutional:      General: She is not in acute distress.    Appearance: She is not ill-appearing.  HENT:     Right Ear: Tympanic membrane and ear canal normal.     Left Ear: Tympanic membrane and ear  canal normal.     Nose: No congestion or rhinorrhea.     Mouth/Throat:  Mouth: Mucous membranes are moist.     Pharynx: Oropharynx is clear. No posterior oropharyngeal erythema.  Eyes:     Conjunctiva/sclera: Conjunctivae normal.  Cardiovascular:     Rate and Rhythm: Normal rate and regular rhythm.     Heart sounds: Normal heart sounds.  Pulmonary:     Effort: Pulmonary effort is normal.     Breath sounds: Normal breath sounds.  Musculoskeletal:     Cervical back: Normal range of motion.  Lymphadenopathy:     Cervical: No cervical adenopathy.  Skin:    General: Skin is warm and dry.  Neurological:     Mental Status: She is alert and oriented to person, place, and time.     UC Treatments / Results  Labs (all labs ordered are listed, but only abnormal results are displayed) Labs Reviewed - No data to display  EKG  Radiology No results found.  Procedures Procedures (including critical care time)  Medications Ordered in UC Medications - No data to display  Initial Impression / Assessment and Plan / UC Course  I have reviewed the triage vital signs and the nursing notes.  Pertinent labs & imaging results that were available during my care of the patient were reviewed by me and considered in my medical decision making (see chart for details).  Afebrile, well-appearing, clear lungs. No sign of ear infection today.  Discussed likely viral etiology.  Symptomatic care at home. Advised daily allergy medicine, Mucinex, nasal spray. Patient agreeable to plan No questions at this time  She has PCP appointment on Monday (in 4 days) and will follow up  there  Final Clinical Impressions(s) / UC Diagnoses   Final diagnoses:  None     Discharge Instructions      Daily zyrtec Daily flonase or nasal saline Mucinex twice daily (with lots of fluids)     ED Prescriptions   None    PDMP not reviewed this encounter.   Octavio Matheney, Ray Church 03/05/23 2017

## 2023-03-09 ENCOUNTER — Ambulatory Visit (HOSPITAL_BASED_OUTPATIENT_CLINIC_OR_DEPARTMENT_OTHER): Payer: Commercial Managed Care - HMO | Admitting: Family Medicine

## 2023-03-10 ENCOUNTER — Telehealth (HOSPITAL_COMMUNITY): Payer: Self-pay

## 2023-03-10 ENCOUNTER — Encounter (HOSPITAL_COMMUNITY): Payer: Self-pay

## 2023-03-10 ENCOUNTER — Ambulatory Visit (HOSPITAL_COMMUNITY): Payer: MEDICAID | Admitting: Clinical

## 2023-03-10 NOTE — Telephone Encounter (Signed)
Patient called the morning of appointment to cancel and reschedule, I asked Patient if they would like for me to make appointment virtual, patient said no just to cancel and reschedule. When I informed Patient that it would be considered a no show patient became upset and asked why stating that " it is only 9 oclock the appointment is at 2". I explained to Patient that we needed a 24 hour notice of cancellation Patient then became upset and hung up the phone, appointment never rescheduled.

## 2023-03-16 ENCOUNTER — Ambulatory Visit (HOSPITAL_BASED_OUTPATIENT_CLINIC_OR_DEPARTMENT_OTHER): Payer: Commercial Managed Care - HMO | Admitting: Family Medicine

## 2023-03-17 ENCOUNTER — Other Ambulatory Visit: Payer: Self-pay

## 2023-03-21 ENCOUNTER — Encounter (HOSPITAL_COMMUNITY): Payer: Self-pay

## 2023-03-21 ENCOUNTER — Emergency Department (HOSPITAL_COMMUNITY)
Admission: EM | Admit: 2023-03-21 | Discharge: 2023-03-22 | Discharge status: Against Medical Advice | Payer: Commercial Managed Care - HMO | Attending: Emergency Medicine | Admitting: Emergency Medicine

## 2023-03-21 ENCOUNTER — Other Ambulatory Visit: Payer: Self-pay

## 2023-03-21 ENCOUNTER — Emergency Department (HOSPITAL_COMMUNITY): Payer: Commercial Managed Care - HMO

## 2023-03-21 DIAGNOSIS — Z5321 Procedure and treatment not carried out due to patient leaving prior to being seen by health care provider: Secondary | ICD-10-CM | POA: Diagnosis not present

## 2023-03-21 DIAGNOSIS — R5383 Other fatigue: Secondary | ICD-10-CM | POA: Diagnosis not present

## 2023-03-21 DIAGNOSIS — R61 Generalized hyperhidrosis: Secondary | ICD-10-CM | POA: Insufficient documentation

## 2023-03-21 DIAGNOSIS — R0789 Other chest pain: Secondary | ICD-10-CM | POA: Diagnosis present

## 2023-03-21 DIAGNOSIS — R0602 Shortness of breath: Secondary | ICD-10-CM | POA: Diagnosis not present

## 2023-03-21 LAB — TROPONIN I (HIGH SENSITIVITY): Troponin I (High Sensitivity): 4 ng/L (ref <18)

## 2023-03-21 LAB — CBC
HCT: 37.5 % (ref 36.0–46.0)
Hemoglobin: 12.4 g/dL (ref 12.0–15.0)
MCH: 29.7 pg (ref 26.0–34.0)
MCHC: 33.1 g/dL (ref 30.0–36.0)
MCV: 89.7 fL (ref 80.0–100.0)
Platelets: 405 10*3/uL — ABNORMAL HIGH (ref 150–400)
RBC: 4.18 MIL/uL (ref 3.87–5.11)
RDW: 14.1 % (ref 11.5–15.5)
WBC: 12.9 10*3/uL — ABNORMAL HIGH (ref 4.0–10.5)
nRBC: 0 % (ref 0.0–0.2)

## 2023-03-21 LAB — BASIC METABOLIC PANEL
Anion gap: 9 (ref 5–15)
BUN: 7 mg/dL (ref 6–20)
CO2: 24 mmol/L (ref 22–32)
Calcium: 8.8 mg/dL — ABNORMAL LOW (ref 8.9–10.3)
Chloride: 108 mmol/L (ref 98–111)
Creatinine, Ser: 0.84 mg/dL (ref 0.44–1.00)
GFR, Estimated: >60 mL/min (ref >60)
Glucose, Bld: 96 mg/dL (ref 70–99)
Potassium: 4.1 mmol/L (ref 3.5–5.1)
Sodium: 141 mmol/L (ref 135–145)

## 2023-03-21 LAB — HCG, SERUM, QUALITATIVE: Preg, Serum: NEGATIVE

## 2023-03-21 NOTE — ED Notes (Signed)
Pt called multiple times in the lobby for vitals without answering

## 2023-03-21 NOTE — ED Notes (Signed)
Called the pt x2 for blood draw,  no answer.

## 2023-03-21 NOTE — ED Triage Notes (Signed)
Pt came in via POV d/t the last month having "sporadic" CP. Reports it does not take much exertion to make her SOB, feeling increased fatigue, shaky, diaphoretic, chest tightness. Endorses Rt sided CP that she now feels on her Lt side & it will radiate into her back intermittently. Does report having acid reflux & green phlegm that she coughs up. A/Ox4, rates her pain 8/10 while in triage.

## 2023-03-22 NOTE — Progress Notes (Deleted)
Celso Amy, PA-C [address]  [address]  [place], [place] [address]  Main: [phone]  Fax: [phone]   Gastroenterology Consultation  Referring Provider:     Collene Mares, [place] Primary Care Physician:  Collene Mares, [place] Primary Gastroenterologist:  *** Reason for Consultation:     GERD        HPI:   LAPORTIA DAMERY is a 47 y.o. y/o female referred for consultation & management  by Hyacinth Meeker, [place], PA.    She has had multiple ED visits in the past year for evaluation of chest pain and abdominal pain.  Negative cardiac evaluation.  Normal chest x-ray.  Diagnosed with GERD/gastritis.  Treated with omeprazole and Carafate.  Previous laparoscopic cholecystectomy for gallstones 04/2021.  Last ED visit and labs 2 days ago 03/21/2023.  Pregnancy test negative.  Normal BMP and CBC, hemoglobin 12.4.  Negative troponin.  Normal chest x-ray.  Last abdominal pelvic CT with contrast 09/2022 showed no acute abnormality to explain abdominal pain.  Severe diffuse hepatic steatosis and hepatomegaly.  Fibroid uterus.  No previous EGD, colonoscopy, or GI evaluation.       Past Medical History:  Diagnosis Date   Back pain    Complication of anesthesia    Ear drum perforation    Fibromyalgia    GERD (gastroesophageal reflux disease)    Migraine    Restless leg syndrome     Past Surgical History:  Procedure Laterality Date   CHOLECYSTECTOMY N/A 05/30/2021   Procedure: LAPAROSCOPIC CHOLECYSTECTOMY WITH ICG DYE;  Surgeon: [name], MD;  Location: [facility];  Service: General;  Laterality: N/A;   TONSILLECTOMY     TUBAL LIGATION      Prior to Admission medications   Medication Sig Start Date End Date Taking? Authorizing Provider  azithromycin (ZITHROMAX Z-PAK) 250 MG tablet 2 tablets initially, then 1 tablet daily until medication completed. 06/14/22   Ellsworth Lennox, PA-C  diphenhydrAMINE (SOMINEX) 25 MG tablet Take 25 mg by mouth daily as needed for  allergies.    [provider]  furosemide (LASIX) 20 MG tablet Take 1 tablet (20 mg total) by mouth daily for 4 days. 01/12/22 01/16/22  Gloris Manchester, MD  hydrOXYzine (ATARAX) 10 MG/5ML syrup Take 12.5 mLs (25 mg total) by mouth 3 (three) times daily as needed for anxiety. 03/25/21   Jeannie Fend, PA-C  hydrOXYzine (ATARAX/VISTARIL) 25 MG tablet TAKE 1 TABLET (25 MG TOTAL) BY MOUTH 4 (FOUR) TIMES DAILY AS NEEDED. 02/13/21   Toy Cookey E, NP  ondansetron (ZOFRAN) 4 MG tablet Take 1 tablet (4 mg total) by mouth every 8 (eight) hours as needed for nausea or vomiting. 09/27/22   Harris, Abigail, PA-C  ondansetron (ZOFRAN-ODT) 4 MG disintegrating tablet Take 1 tablet (4 mg total) by mouth every 8 (eight) hours as needed for nausea or vomiting. 06/14/21   Alvira Monday, MD  oxyCODONE (OXY IR/ROXICODONE) 5 MG immediate release tablet Take 1 tablet (5 mg total) by mouth every 4 (four) hours as needed for moderate pain. 05/30/21   [name], MD  predniSONE (DELTASONE) 10 MG tablet Take 1 tablet (10 mg total) by mouth 3 (three) times daily. 06/14/22   Ellsworth Lennox, PA-C  promethazine-dextromethorphan (PROMETHAZINE-DM) 6.25-15 MG/5ML syrup Take 5 mLs by mouth 4 (four) times daily as needed for cough. 06/20/21   Lamptey, Britta Mccreedy, MD  sucralfate (CARAFATE) 1 g tablet Take 1 tablet (1 g total) by mouth 4 (four) times daily -  with meals and at bedtime for 14 days. 05/09/22 05/23/22  Curatolo, Adam, DO  ursodiol (ACTIGALL) 300 MG capsule Take 1 capsule by mouth three times daily for 30 days. Patient not taking: Reported on 05/30/2021 04/25/21   Ernie Avena, MD    Family History  Problem Relation Age of Onset   Osteoporosis Mother    Hypertension Father    Heart disease Father    Gout Father    Cancer Other      Social History   Tobacco Use   Smoking status: Never   Smokeless tobacco: Never  Vaping Use   Vaping status: Never Used  Substance Use Topics   Alcohol use: No   Drug  use: Yes    Types: Marijuana    Allergies as of 03/23/2023 - Review Complete 03/21/2023  Allergen Reaction Noted   Aspirin Other (See Comments) 01/16/2018   Compazine [prochlorperazine edisylate] Other (See Comments) 12/20/2012   Effexor [venlafaxine]  12/17/2016   Prochlorperazine  07/23/2021   Tylenol [acetaminophen] Nausea Only and Rash 03/25/2021    Review of Systems:    All systems reviewed and negative except where noted in HPI.   Physical Exam:  LMP 03/07/2023  Patient's last menstrual period was 03/07/2023.  General:   Alert,  Well-developed, well-nourished, pleasant and cooperative in NAD Lungs:  Respirations even and unlabored.  Clear throughout to auscultation.   No wheezes, crackles, or rhonchi. No acute distress. Heart:  Regular rate and rhythm; no murmurs, clicks, rubs, or gallops. Abdomen:  Normal bowel sounds.  No bruits.  Soft, and non-distended without masses, hepatosplenomegaly or hernias noted.  No Tenderness.  No guarding or rebound tenderness.    Neurologic:  Alert and oriented x3;  grossly normal neurologically. Psych:  Alert and cooperative. Normal mood and affect.  Imaging Studies: DG Chest 2 View  Result Date: 03/21/2023 CLINICAL DATA:  Chest pain EXAM: CHEST - 2 VIEW COMPARISON:  05/09/2022 FINDINGS: The heart size and mediastinal contours are within normal limits. Both lungs are clear. The visualized skeletal structures are unremarkable. IMPRESSION: No active cardiopulmonary disease. Electronically Signed   By: Kennith Center M.D.   On: 03/21/2023 18:59    Assessment and Plan:   WRETHA LIEB is a 47 y.o. y/o female has been referred for ***  Follow up ***  Celso Amy, PA-C    BP check ***

## 2023-03-23 ENCOUNTER — Ambulatory Visit: Payer: Commercial Managed Care - HMO | Admitting: Physician Assistant

## 2023-03-27 ENCOUNTER — Ambulatory Visit (HOSPITAL_BASED_OUTPATIENT_CLINIC_OR_DEPARTMENT_OTHER): Payer: Commercial Managed Care - HMO | Admitting: Family Medicine

## 2023-03-30 ENCOUNTER — Encounter (HOSPITAL_BASED_OUTPATIENT_CLINIC_OR_DEPARTMENT_OTHER): Payer: Self-pay

## 2023-03-30 ENCOUNTER — Ambulatory Visit (HOSPITAL_BASED_OUTPATIENT_CLINIC_OR_DEPARTMENT_OTHER): Payer: Commercial Managed Care - HMO | Admitting: Family Medicine

## 2023-04-16 ENCOUNTER — Ambulatory Visit: Payer: Commercial Managed Care - HMO | Admitting: Physician Assistant

## 2023-04-18 IMAGING — DX DG ABDOMEN 1V
2 series · 2 of 2 positions shown · non-contrast
Comparison: None.

CLINICAL DATA: Upper abdominal pain and swelling

EXAM:
ABDOMEN - 1 VIEW

[abdomen kub (1 of 2)]
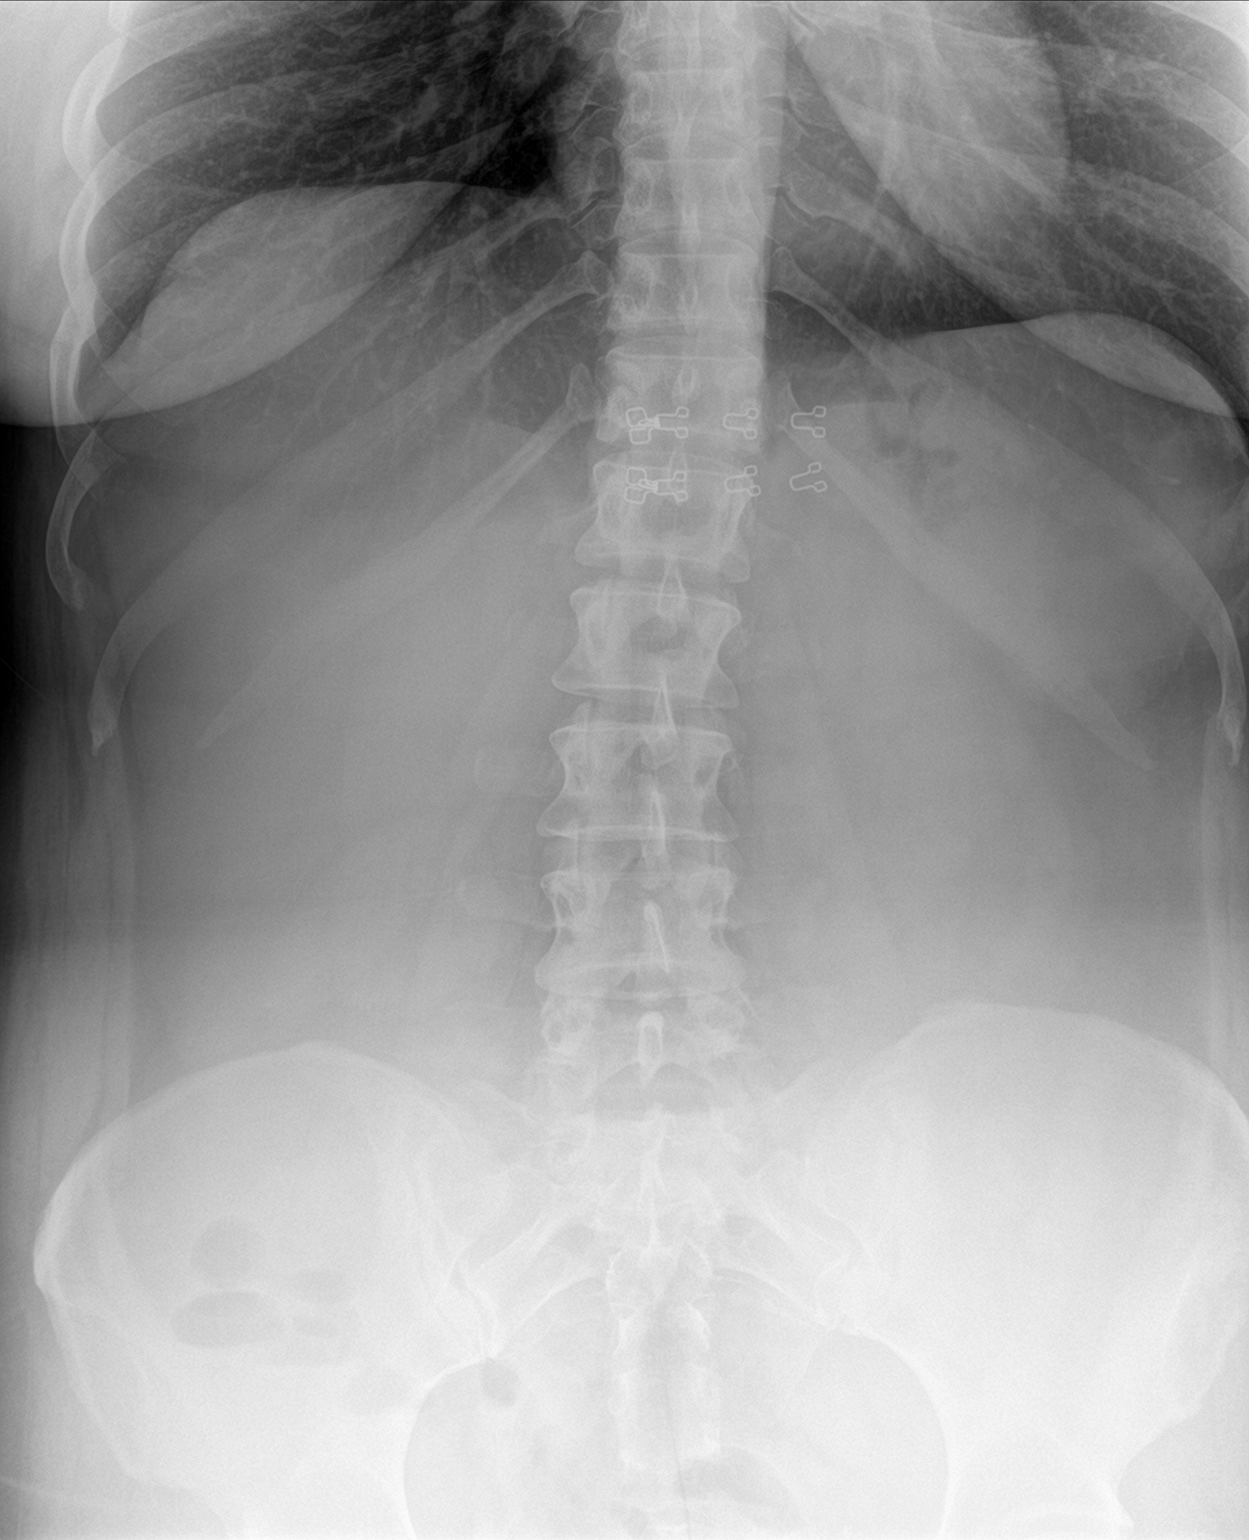

[abdomen kub (2 of 2)]
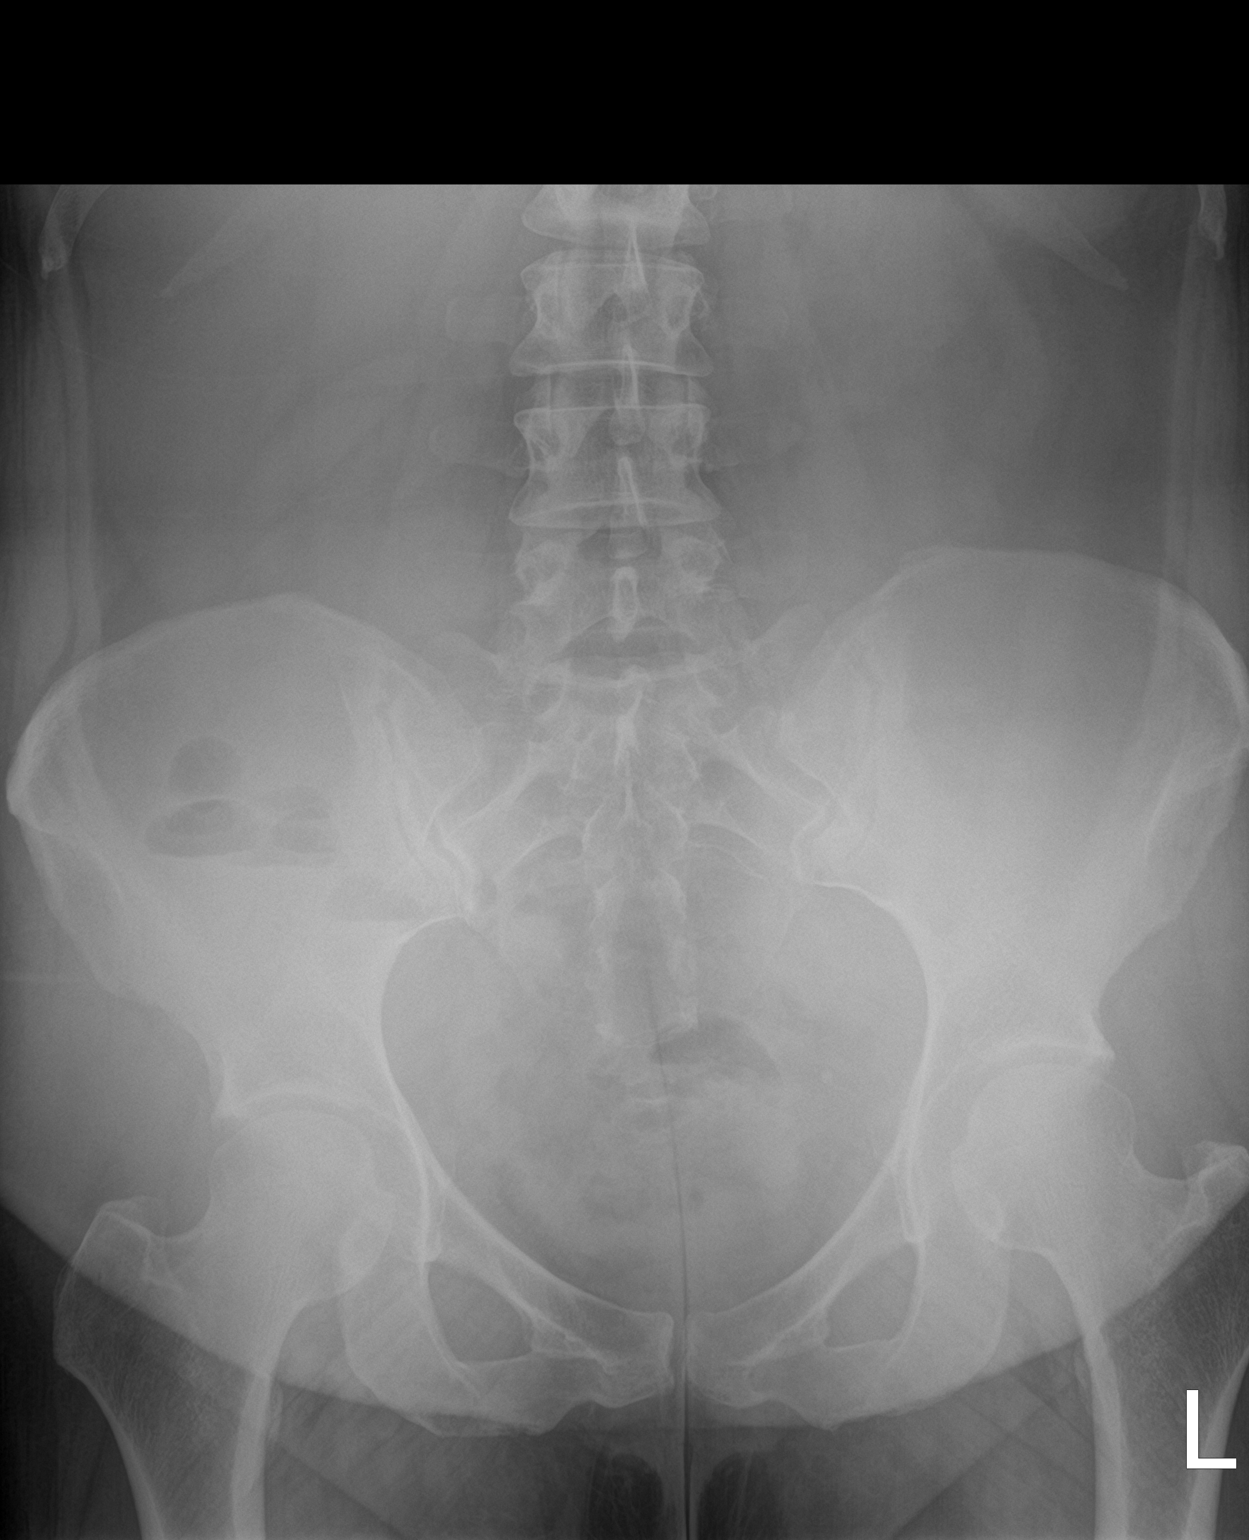

[2 of 2 positions shown; findings below may reference images not displayed]

FINDINGS: The bowel gas pattern is normal. No radio-opaque calculi or other
significant radiographic abnormality are seen.
IMPRESSION: Negative.

## 2023-04-20 ENCOUNTER — Ambulatory Visit: Payer: Commercial Managed Care - HMO | Admitting: Family Medicine

## 2023-04-22 ENCOUNTER — Ambulatory Visit (HOSPITAL_BASED_OUTPATIENT_CLINIC_OR_DEPARTMENT_OTHER): Payer: Commercial Managed Care - HMO | Admitting: Family Medicine

## 2023-04-24 ENCOUNTER — Encounter (HOSPITAL_BASED_OUTPATIENT_CLINIC_OR_DEPARTMENT_OTHER): Payer: Self-pay | Admitting: Family Medicine

## 2023-04-24 ENCOUNTER — Other Ambulatory Visit (HOSPITAL_BASED_OUTPATIENT_CLINIC_OR_DEPARTMENT_OTHER): Payer: Self-pay

## 2023-04-24 ENCOUNTER — Ambulatory Visit (INDEPENDENT_AMBULATORY_CARE_PROVIDER_SITE_OTHER): Payer: 59 | Admitting: Family Medicine

## 2023-04-24 VITALS — BP 137/94 | HR 98 | Ht 64.0 in | Wt 227.5 lb

## 2023-04-24 DIAGNOSIS — R7303 Prediabetes: Secondary | ICD-10-CM | POA: Diagnosis not present

## 2023-04-24 DIAGNOSIS — R5382 Chronic fatigue, unspecified: Secondary | ICD-10-CM

## 2023-04-24 DIAGNOSIS — K219 Gastro-esophageal reflux disease without esophagitis: Secondary | ICD-10-CM | POA: Diagnosis not present

## 2023-04-24 DIAGNOSIS — B9689 Other specified bacterial agents as the cause of diseases classified elsewhere: Secondary | ICD-10-CM

## 2023-04-24 DIAGNOSIS — J019 Acute sinusitis, unspecified: Secondary | ICD-10-CM | POA: Diagnosis not present

## 2023-04-24 DIAGNOSIS — Z7689 Persons encountering health services in other specified circumstances: Secondary | ICD-10-CM | POA: Diagnosis not present

## 2023-04-24 DIAGNOSIS — Z1231 Encounter for screening mammogram for malignant neoplasm of breast: Secondary | ICD-10-CM

## 2023-04-24 DIAGNOSIS — F319 Bipolar disorder, unspecified: Secondary | ICD-10-CM

## 2023-04-24 DIAGNOSIS — Z1211 Encounter for screening for malignant neoplasm of colon: Secondary | ICD-10-CM

## 2023-04-24 MED ORDER — PANTOPRAZOLE SODIUM 40 MG PO TBEC
40.0000 mg | DELAYED_RELEASE_TABLET | Freq: Every day | ORAL | 3 refills | Status: DC
  Filled 2023-04-24: qty 30, 30d supply, fill #0

## 2023-04-24 MED ORDER — DOXYCYCLINE HYCLATE 100 MG PO TABS
100.0000 mg | ORAL_TABLET | Freq: Two times a day (BID) | ORAL | 0 refills | Status: DC
  Filled 2023-04-24: qty 14, 7d supply, fill #0

## 2023-04-24 NOTE — Progress Notes (Unsigned)
New Patient Office Visit  Subjective:   Alicia Parks 10-09-1975 04/24/2023  Chief Complaint  Patient presents with   New Patient (Initial Visit)    Patient is here today to get established with the practice. States she has been having problems with tightness in her chest for about the past month. Also states she has been having problems with shortness of breath.    HPI: Alicia Parks presents today to establish care at [facility] Care [facility] Sports Medicine at [facility] Introduced to Publishing rights manager role and practice setting.  All questions answered.   Last PCP: [facility] Last annual physical: Unknown Concerns: See below    URI SYMPTOMS: Onset: 4 weeks   Fever: no Runny nose: yes Nasal congestion: yes  Sinus pressure: Yes Post nasal drip: Yes Cough: Yes, congested and productive intermittently  Ear pain: None Sore throat: None Headaches: Yes, posterior   Treatments tried: None  Recent sick contacts: None  Tobacco Use: None  Vaping: Yes, uses THC pens intermittently    FATIGUE: Onset: Pt reports ongoing for 2 years   Fevers: no Generalized weakness:  yes Chest pain: no Shortness of breath: no Dyspnea on exertion: no Orthopnea: no Lower extremity edema: yes, patient reports ongoing gout to lower extremities Arthralgias:yes Weakness: no Bloody or dark stools: no Depression:  Yes, per patient due to trauma and stressors in 2022 Recent stress/anxiety: yes Observed apnea by bed partner:  No, but patient does snore lightly Daytime hypersomnolence:no, denies napping or somnolence during the day    IMPAIRED FASTING GLUCOSE AMAMDA CURBOW is here for medical management of impaired fasting glucose. Patient reports her previous A1C (unable to view in chart by PCP) have been borderline prediabetic and would like to have A1C checked and IFG managed by PCP.  Patient's current IFG medication regimen is: Diet and exercise   Adhering to a diabetic diet: Yes Exercising Regularly: Yes Checking Blood Sugars: Not currently  Denies polydipsia, polyphagia, polyuria.    [name] presents for the medical management of depression. Patient would like referral to [facility]. She was previously followed by Psychiatrist on [address] in [place] and had several medications managing her Bipolar 1 Depression. Patient declines starting medication today but would like referral.  Current medication regimen: None Counseling: Recommended Well controlled: Not currently Reports passive SI/HI. Denies plan at this time. Agreeable to safety plan.      04/24/2023   10:26 AM 05/22/2021    9:36 AM 02/13/2021    2:53 PM 10/04/2020    9:03 AM 04/25/2020    8:53 AM  Depression screen PHQ 2/9  Decreased Interest 3   1   Down, Depressed, Hopeless 3   2   PHQ - 2 Score 6   3   Altered sleeping 3   3   Tired, decreased energy 3   3   Change in appetite 3   0   Feeling bad or failure about yourself  3   1   Trouble concentrating 0   0   Moving slowly or fidgety/restless 0   1   Suicidal thoughts 3   1   PHQ-9 Score 21   12   Difficult doing work/chores Very difficult         Information is confidential and restricted. Go to Review Flowsheets to unlock data.       04/24/2023   10:26 AM 05/22/2021    9:36  AM 02/13/2021    2:53 PM  PHQ9 SCORE ONLY  PHQ-9 Total Score 21       Information is confidential and restricted. Go to Review Flowsheets to unlock data.     The following portions of the patient's history were reviewed and updated as appropriate: past medical history, past surgical history, family history, social history, allergies, medications, and problem list.   Patient Active Problem List   Diagnosis Date Noted   Lumbar pain 07/24/2021   Symptomatic cholelithiasis 05/30/2021   Cholecystitis 05/30/2021   Dental abscess 10/04/2020   GERD (gastroesophageal reflux disease)  10/04/2020   Generalized anxiety disorder 04/25/2020   Bipolar I disorder, most recent episode depressed (HCC) 04/25/2020   Moderate episode of recurrent major depressive disorder (HCC) 04/25/2020   Past Medical History:  Diagnosis Date   Anxiety 2012   So bad it keeps me in the house   Arthritis    Back pain    Complication of anesthesia    Depression Birth   Ear drum perforation    Fibromyalgia    GERD (gastroesophageal reflux disease)    Migraine    Restless leg syndrome    Ulcer 2018   Past Surgical History:  Procedure Laterality Date   CHOLECYSTECTOMY N/A 05/30/2021   Procedure: LAPAROSCOPIC CHOLECYSTECTOMY WITH ICG DYE;  Surgeon: [name], MD;  Location: [facility] OR;  Service: General;  Laterality: N/A;   TONSILLECTOMY     TUBAL LIGATION     Family History  Problem Relation Age of Onset   Osteoporosis Mother    Arthritis Mother    Hearing loss Mother    Miscarriages / [place] Mother    Hypertension Father    Heart disease Father    Gout Father    Anxiety disorder Father    Depression Father    Stroke Father    Cancer Other    Alcohol abuse Maternal Grandmother    Social History   Socioeconomic History   Marital status: Married    Spouse name: Not on file   Number of children: Not on file   Years of education: Not on file   Highest education level: Associate degree: occupational, Scientist, product/process development, or vocational program  Occupational History   Not on file  Tobacco Use   Smoking status: Never   Smokeless tobacco: Never  Vaping Use   Vaping status: Never Used  Substance and Sexual Activity   Alcohol use: No   Drug use: Not Currently    Types: Marijuana   Sexual activity: Not Currently    Birth control/protection: Surgical  Other Topics Concern   Not on file  Social History Narrative   Not on file   Social Determinants of Health   Financial Resource Strain: High Risk (04/23/2023)   Overall Financial Resource Strain (CARDIA)    Difficulty of  Paying Living Expenses: Very hard  Food Insecurity: Food Insecurity Present (04/23/2023)   Hunger Vital Sign    Worried About Running Out of Food in the Last Year: Often true    Ran Out of Food in the Last Year: Often true  Transportation Needs: Unmet Transportation Needs (04/23/2023)   PRAPARE - Administrator, Civil Service (Medical): Yes    Lack of Transportation (Non-Medical): Yes  Physical Activity: Insufficiently Active (04/23/2023)   Exercise Vital Sign    Days of Exercise per Week: 2 days    Minutes of Exercise per Session: 30 min  Stress: Stress Concern Present (04/23/2023)   [employer]  of Occupational Health - Occupational Stress Questionnaire    Feeling of Stress : Very much  Social Connections: Socially Isolated (04/23/2023)   Social Connection and Isolation Panel [NHANES]    Frequency of Communication with Friends and Family: Never    Frequency of Social Gatherings with Friends and Family: Never    Attends Religious Services: Never    Database administrator or Organizations: No    Attends Engineer, structural: Not on file    Marital Status: Living with partner  Intimate Partner Violence: Not At Risk (04/24/2023)   Humiliation, Afraid, Rape, and Kick questionnaire    Fear of Current or Ex-Partner: No    Emotionally Abused: No    Physically Abused: No    Sexually Abused: No   Outpatient Medications Prior to Visit  Medication Sig Dispense Refill   diphenhydrAMINE (BENADRYL) 25 MG tablet Take 25 mg by mouth every 6 (six) hours as needed.     azithromycin (ZITHROMAX Z-PAK) 250 MG tablet 2 tablets initially, then 1 tablet daily until medication completed. 6 each 0   diphenhydrAMINE (SOMINEX) 25 MG tablet Take 25 mg by mouth daily as needed for allergies.     furosemide (LASIX) 20 MG tablet Take 1 tablet (20 mg total) by mouth daily for 4 days. 4 tablet 0   hydrOXYzine (ATARAX) 10 MG/5ML syrup Take 12.5 mLs (25 mg total) by mouth 3 (three) times  daily as needed for anxiety. 240 mL 0   hydrOXYzine (ATARAX/VISTARIL) 25 MG tablet TAKE 1 TABLET (25 MG TOTAL) BY MOUTH 4 (FOUR) TIMES DAILY AS NEEDED. 90 tablet 3   ondansetron (ZOFRAN) 4 MG tablet Take 1 tablet (4 mg total) by mouth every 8 (eight) hours as needed for nausea or vomiting. 10 tablet 0   ondansetron (ZOFRAN-ODT) 4 MG disintegrating tablet Take 1 tablet (4 mg total) by mouth every 8 (eight) hours as needed for nausea or vomiting. 20 tablet 0   oxyCODONE (OXY IR/ROXICODONE) 5 MG immediate release tablet Take 1 tablet (5 mg total) by mouth every 4 (four) hours as needed for moderate pain. 10 tablet 0   predniSONE (DELTASONE) 10 MG tablet Take 1 tablet (10 mg total) by mouth 3 (three) times daily. 15 tablet 0   promethazine-dextromethorphan (PROMETHAZINE-DM) 6.25-15 MG/5ML syrup Take 5 mLs by mouth 4 (four) times daily as needed for cough. 118 mL 0   sucralfate (CARAFATE) 1 g tablet Take 1 tablet (1 g total) by mouth 4 (four) times daily -  with meals and at bedtime for 14 days. 56 tablet 0   ursodiol (ACTIGALL) 300 MG capsule Take 1 capsule by mouth three times daily for 30 days. (Patient not taking: Reported on 05/30/2021) 90 capsule 0   No facility-administered medications prior to visit.   Allergies  Allergen Reactions   Aspirin Other (See Comments)    "gives me the jitters"   Compazine [Prochlorperazine Edisylate] Other (See Comments)    Makes body move funny. Shaking   Effexor [Venlafaxine]     Nightmares and night sweats   Prochlorperazine     Other reaction(s): Body shakes   Tylenol [Acetaminophen] Nausea Only and Rash    ROS: A complete ROS was performed with pertinent positives/negatives noted in the HPI. The remainder of the ROS are negative.   Objective:   Today's Vitals   04/24/23 1017 04/24/23 1110  BP: (!) 143/92 (!) 137/94  Pulse: 98   SpO2: 98%   Weight: 227 lb 8 oz (103.2 kg)  Height: 5\' 4"  (1.626 m)     GENERAL: Well-appearing, in NAD. Well  nourished.  SKIN: Pink, warm and dry. No rash, lesion, ulceration, or ecchymoses.  Head: Normocephalic. NECK: Trachea midline. Full ROM w/o pain or tenderness. Mild anterior cervical lymphadenopathy.  EARS: Tympanic membranes are intact, translucent without bulging and without drainage. Appropriate landmarks visualized.  EYES: Conjunctiva clear without exudates. EOMI, PERRL, no drainage present.  NOSE: Septum midline w/o deformity. Nares patent, mucosa pink and mildly inflamed w/ clear drainage. No sinus tenderness.  THROAT: Uvula midline. Oropharynx clear.  Mucous membranes pink and moist.  RESPIRATORY: Chest wall symmetrical. Respirations even and non-labored. Breath sounds clear to auscultation bilaterally. Cough congested and non productive. CARDIAC: S1, S2 present, regular rate and rhythm without murmur or gallops. Peripheral pulses 2+ bilaterally.  MSK: Muscle tone and strength appropriate for age. Joints w/o tenderness, redness, or swelling.  EXTREMITIES: Without clubbing, cyanosis, or edema.  NEUROLOGIC: No motor or sensory deficits. Steady, even gait. C2-C12 intact.  PSYCH/MENTAL STATUS: Alert, oriented x 3. Cooperative, appropriate mood and affect.    Health Maintenance Due  Topic Date Due   MAMMOGRAM  Never done   Hepatitis C Screening  Never done   DTaP/Tdap/Td (1 - Tdap) Never done   Cervical Cancer Screening (HPV/Pap Cotest)  Never done   Colonoscopy  Never done    No results found for any visits on 04/24/23.     Assessment & Plan:  1. Encounter to establish care with new doctor Discussed need to obtain records from prior PCP to update record. Release signed by patient.   2. Acute bacterial sinusitis Start Doxycycline Bid x 7 days. Recommend OTC Mucinex for congestion and flonase as needed as well.   3. Screening for colon cancer Referral placed to [facility].  - Ambulatory referral to Gastroenterology  4. Breast cancer screening by mammogram [facility]   5. Chronic fatigue Discussed possible causes with patient and will rule out anemia, thyroid disorder, vitamin d or b12 deficiency with lab work.  - CBC with Differential/Platelet; Future - TSH; Future - VITAMIN D 25 Hydroxy (Vit-D Deficiency, Fractures); Future - Vitamin B12; Future  6. Prediabetes Will obtain A1c, lipid and CMP to evaluate prediabetes/diabetes management.  - Comprehensive metabolic panel; Future - Lipid panel; Future - Hemoglobin A1c; Future  7. Bipolar depression (HCC) Discussed possible medications with patient and she declined in office. Referral placed to Psychiatry to establish care. Safety plan reviewed and pt verbalized understanding. Aware of [facility] in [place] as well.  - Ambulatory referral to Psychiatry  8. Gastroesophageal reflux disease, unspecified whether esophagitis present Stable. Pantoprazole refilled.    Patient to reach out to office if new, worrisome, or unresolved symptoms arise or if no improvement in patient's condition. Patient verbalized understanding and is agreeable to treatment plan. All questions answered to patient's satisfaction.    Return in about 2 months (around 06/24/2023) for ANNUAL PHYSICAL.   Of note, portions of this note may have been created with voice recognition software Physicist, medical). While this note has been edited for accuracy, occasional wrong-word or 'sound-a-like' substitutions may have occurred due to the inherent limitations of voice recognition software.  Yolanda Manges, FNP

## 2023-04-27 ENCOUNTER — Telehealth (HOSPITAL_BASED_OUTPATIENT_CLINIC_OR_DEPARTMENT_OTHER): Payer: Self-pay | Admitting: Family Medicine

## 2023-04-27 ENCOUNTER — Other Ambulatory Visit (HOSPITAL_BASED_OUTPATIENT_CLINIC_OR_DEPARTMENT_OTHER): Payer: Self-pay | Admitting: Family Medicine

## 2023-04-27 MED ORDER — GUAIFENESIN-DM 100-10 MG/5ML PO SYRP: 5.0000 mL | ORAL_SOLUTION | ORAL | 0 refills | Status: DC | PRN

## 2023-04-27 MED ORDER — GUAIFENESIN-CODEINE 100-10 MG/5ML PO SOLN: 5.0000 mL | Freq: Four times a day (QID) | ORAL | 0 refills | Status: DC | PRN

## 2023-04-27 NOTE — Telephone Encounter (Signed)
Pt advised cough syrup was at pharmacy

## 2023-04-27 NOTE — Telephone Encounter (Signed)
Pt called back stating she went to the pharmacy to pick up cough syrup and insurance would not cover this because it was an otc medication. It is 80$. She has tried robitussin and this is not working wanted to know what else could be prescribed.

## 2023-04-27 NOTE — Telephone Encounter (Signed)
Antibiotics are making the patient sick (causing reflux but can tolerate) she has tried all OTC cough medicines and want to see if you can prescribe a cough syrup for her. None are working. Please advise patient

## 2023-04-27 NOTE — Telephone Encounter (Signed)
Routing to Amanda for review.

## 2023-04-30 ENCOUNTER — Ambulatory Visit: Payer: Commercial Managed Care - HMO | Admitting: Family Medicine

## 2023-05-02 ENCOUNTER — Emergency Department (HOSPITAL_COMMUNITY)
Admission: EM | Admit: 2023-05-02 | Discharge: 2023-05-02 | Disposition: A | Discharge status: Home / Self Care | Payer: 59 | Attending: Emergency Medicine | Admitting: Emergency Medicine

## 2023-05-02 ENCOUNTER — Encounter (HOSPITAL_COMMUNITY): Payer: Self-pay

## 2023-05-02 ENCOUNTER — Other Ambulatory Visit: Payer: Self-pay

## 2023-05-02 DIAGNOSIS — Z20822 Contact with and (suspected) exposure to covid-19: Secondary | ICD-10-CM | POA: Diagnosis not present

## 2023-05-02 DIAGNOSIS — J320 Chronic maxillary sinusitis: Secondary | ICD-10-CM | POA: Diagnosis not present

## 2023-05-02 DIAGNOSIS — R0981 Nasal congestion: Secondary | ICD-10-CM | POA: Diagnosis not present

## 2023-05-02 LAB — RESP PANEL BY RT-PCR (RSV, FLU A&B, COVID)  RVPGX2
Influenza A by PCR: NEGATIVE
Influenza B by PCR: NEGATIVE
Resp Syncytial Virus by PCR: NEGATIVE
SARS Coronavirus 2 by RT PCR: NEGATIVE

## 2023-05-02 MED ORDER — PREDNISONE 20 MG PO TABS: 40.0000 mg | ORAL_TABLET | Freq: Every day | ORAL | 0 refills | Status: DC

## 2023-05-02 MED ORDER — FLUTICASONE PROPIONATE 50 MCG/ACT NA SUSP: 1.0000 | Freq: Every day | NASAL | 0 refills | Status: DC

## 2023-05-02 NOTE — ED Triage Notes (Signed)
Pt c/o congestion, right sided head pain and cough x 1 month. Pt has tried multiple OTC medications. Pt has seen PCP and prescribed doxycycline 1 week ago. Pt has worsening symptoms. Pt unable to hear out of right ear.

## 2023-05-02 NOTE — Discharge Instructions (Addendum)
It is okay to use Sudafed to see if that also helps with the congestion.  Finish her doxycycline but it does not appear that you need any further antibiotics at this time.  You can try sinus rinses, the Flonase to help decrease inflammation and a short course of prednisone.  It is okay to take ibuprofen or Tylenol as needed for pain warm and cool compresses over the sinuses as needed.  You can place some Vaseline on the inside of your nose to make sure the skin does not dry out you do not have nosebleeding.

## 2023-05-02 NOTE — ED Provider Notes (Signed)
[place] EMERGENCY DEPARTMENT AT [facility] Provider Note   CSN: [id number] Arrival date & time: 05/02/23  1414     History  Chief Complaint  Patient presents with   Nasal Congestion    Alicia Parks is a 47 y.o. female.  Patient is a 47 year old female with a history of migraines, GERD who is presenting today with complaint of ongoing sinus pain.  She reports that it started after URI symptoms but for the last at least month she has had ongoing pain in the right side of her face and ear.  She is still having nasal discharge sometimes that is bloody seems to come out more on the left than the right.  Also at night she will have severe pain behind her right ear that will throb and then resolve after she becomes upright in the morning.  She reports she is really not able to hear anything out of her right ear at this time but denies anything draining from the ear.  No fevers, visual changes.  She saw her PCP last week and was given doxycycline but does not feel that it is changed her symptoms at all.  She has not used any nasal sprays because she feels that it makes her nose burn.  She has tried multiple over-the-counter medications and did report that Sudafed that she used last week did seem to dry her left sinus out a bit and made it feel better.   The history is provided by the patient.       Home Medications Prior to Admission medications   Medication Sig Start Date End Date Taking? Authorizing Provider  fluticasone (FLONASE) 50 MCG/ACT nasal spray Place 1 spray into both nostrils daily. 05/02/23  Yes Tijah Hane, Alphonzo Lemmings, MD  predniSONE (DELTASONE) 20 MG tablet Take 2 tablets (40 mg total) by mouth daily. 05/02/23  Yes Gwyneth Sprout, MD  diphenhydrAMINE (BENADRYL) 25 MG tablet Take 25 mg by mouth every 6 (six) hours as needed.    [provider]  doxycycline (VIBRA-TABS) 100 MG tablet Take 1 tablet (100 mg total) by mouth 2 (two) times daily. 04/24/23   Caudle,  Shelton Silvas, FNP  guaiFENesin-codeine 100-10 MG/5ML syrup Take 5 mLs by mouth every 6 (six) hours as needed for cough. 04/27/23   Caudle, Shelton Silvas, FNP  pantoprazole (PROTONIX) 40 MG tablet Take 1 tablet (40 mg total) by mouth daily. 04/24/23   Caudle, Shelton Silvas, FNP      Allergies    Aspirin, Compazine [prochlorperazine edisylate], Effexor [venlafaxine], Prochlorperazine, and Tylenol [acetaminophen]    Review of Systems   Review of Systems  Physical Exam Updated Vital Signs BP (!) 138/92 (BP Location: Right Arm)   Pulse 88   Temp 98 F (36.7 C) (Oral)   Resp 18   Ht 5\' 4"  (1.626 m)   Wt 103 kg   SpO2 99%   BMI 38.98 kg/m  Physical Exam Vitals and nursing note reviewed.  Constitutional:      General: She is not in acute distress.    Appearance: She is well-developed.  HENT:     Head: Normocephalic and atraumatic.     Right Ear: Tympanic membrane is scarred. Tympanic membrane is not injected, retracted or bulging.     Left Ear: Tympanic membrane is scarred and bulging. Tympanic membrane is not perforated.     Nose: Septal deviation and mucosal edema present.     Right Turbinates: Enlarged.     Right Sinus: Maxillary sinus  tenderness and frontal sinus tenderness present.  Eyes:     Pupils: Pupils are equal, round, and reactive to light.  Cardiovascular:     Rate and Rhythm: Normal rate.  Pulmonary:     Effort: Pulmonary effort is normal. No respiratory distress.  Musculoskeletal:        General: No tenderness. Normal range of motion.     Comments: No edema  Skin:    General: Skin is warm and dry.     Findings: No rash.  Neurological:     Mental Status: She is alert and oriented to person, place, and time.     Cranial Nerves: No cranial nerve deficit.  Psychiatric:        Behavior: Behavior normal.     ED Results / Procedures / Treatments   Labs (all labs ordered are listed, but only abnormal results are displayed) Labs Reviewed  RESP PANEL BY RT-PCR  (RSV, FLU A&B, COVID)  [id number]    EKG None  Radiology No results found.  Procedures Procedures    Medications Ordered in ED Medications - No data to display  ED Course/ Medical Decision Making/ A&P                                 Medical Decision Making Risk Prescription drug management.   Patient presenting today with ongoing pain in the right sinuses and pain in her ear.  All concern for a blocked maxillary sinus and TM effusion.  No evidence of mastoiditis, otitis media or externa.  Patient's visual acuity is normal.  She has already taken a week of doxycycline and low suspicion for bacterial infection at this time.  Suspect that this is most likely inflammatory and blocked sinus.  Recommended that patient try sinus rinses, Flonase and was given a short course of prednisone.  Given ENT follow-up.  COVID and flu are negative.        Final Clinical Impression(s) / ED Diagnoses Final diagnoses:  Chronic maxillary sinusitis    Rx / DC Orders ED Discharge Orders          Ordered    predniSONE (DELTASONE) 20 MG tablet  Daily        05/02/23 1814    fluticasone (FLONASE) 50 MCG/ACT nasal spray  Daily        05/02/23 1814              Gwyneth Sprout, MD 05/02/23 1827

## 2023-05-04 NOTE — Telephone Encounter (Signed)
Are you okay with referring patient to ENT?

## 2023-05-04 NOTE — Telephone Encounter (Signed)
Please see new mychart message sent by pt and advise.

## 2023-05-05 DIAGNOSIS — H6991 Unspecified Eustachian tube disorder, right ear: Secondary | ICD-10-CM | POA: Insufficient documentation

## 2023-05-18 NOTE — Telephone Encounter (Signed)
Please see recent mychart message sent by pt and advise:  Davina Poke afternoon I am experiencing some muscle spasm in my back often too the point o can't get out of my bed I work now Monday through Friday and sometimes it's impossible too do my job I'm also dealing with some pain in my feet from having gout not sure what she can do but let me know have a great day and holiday.

## 2023-05-25 ENCOUNTER — Encounter (HOSPITAL_BASED_OUTPATIENT_CLINIC_OR_DEPARTMENT_OTHER): Payer: Self-pay | Admitting: Family Medicine

## 2023-05-25 DIAGNOSIS — R7303 Prediabetes: Secondary | ICD-10-CM | POA: Insufficient documentation

## 2023-05-25 DIAGNOSIS — E559 Vitamin D deficiency, unspecified: Secondary | ICD-10-CM | POA: Insufficient documentation

## 2023-05-26 ENCOUNTER — Other Ambulatory Visit (HOSPITAL_BASED_OUTPATIENT_CLINIC_OR_DEPARTMENT_OTHER): Payer: Self-pay | Admitting: *Deleted

## 2023-05-26 ENCOUNTER — Other Ambulatory Visit (HOSPITAL_BASED_OUTPATIENT_CLINIC_OR_DEPARTMENT_OTHER): Payer: Self-pay

## 2023-05-26 ENCOUNTER — Telehealth (HOSPITAL_BASED_OUTPATIENT_CLINIC_OR_DEPARTMENT_OTHER): Payer: Self-pay | Admitting: *Deleted

## 2023-05-26 MED ORDER — PANTOPRAZOLE SODIUM 40 MG PO TBEC: 40.0000 mg | DELAYED_RELEASE_TABLET | Freq: Every day | ORAL | 3 refills | Status: DC

## 2023-05-26 NOTE — Telephone Encounter (Signed)
Medication sent to pharmacy  

## 2023-05-26 NOTE — Telephone Encounter (Signed)
Copied from CRM [id number]. Topic: Clinical - Medication Refill >> May 26, 2023  2:18 PM Tiffany H wrote: Most Recent Primary Care Visit:  Provider: Hilbert Bible  Department: [facility]  Visit Type: NEW PATIENT  Date: 04/24/2023  Medication: Pantoprazole 40mg  1x  Has the patient contacted their pharmacy? Yes (Agent: If no, request that the patient contact the pharmacy for the refill. If patient does not wish to contact the pharmacy document the reason why and proceed with request.) Patient did not have refills.  (Agent: If yes, when and what did the pharmacy advise?)   Is this the correct pharmacy for this prescription? Yes If no, delete pharmacy and type the correct one.  This is the patient's preferred pharmacy:  [id number] [place], [place] - [address] AT [facility] [address] [place] [address] Phone: [phone] Fax: [phone]   Has the prescription been filled recently? Yes  Is the patient out of the medication? No  Has the patient been seen for an appointment in the last year OR does the patient have an upcoming appointment? Yes  Can we respond through MyChart? Yes  Agent: Please be advised that Rx refills may take up to 3 business days. We ask that you follow-up with your pharmacy.

## 2023-06-03 ENCOUNTER — Other Ambulatory Visit (HOSPITAL_BASED_OUTPATIENT_CLINIC_OR_DEPARTMENT_OTHER): Payer: Self-pay | Admitting: *Deleted

## 2023-06-03 ENCOUNTER — Encounter (HOSPITAL_BASED_OUTPATIENT_CLINIC_OR_DEPARTMENT_OTHER): Payer: Self-pay | Admitting: *Deleted

## 2023-06-03 ENCOUNTER — Ambulatory Visit: Payer: Commercial Managed Care - HMO | Admitting: Family Medicine

## 2023-06-03 DIAGNOSIS — Z7689 Persons encountering health services in other specified circumstances: Secondary | ICD-10-CM

## 2023-06-25 ENCOUNTER — Encounter (HOSPITAL_BASED_OUTPATIENT_CLINIC_OR_DEPARTMENT_OTHER): Payer: MEDICAID | Admitting: Family Medicine

## 2023-07-01 ENCOUNTER — Other Ambulatory Visit (HOSPITAL_BASED_OUTPATIENT_CLINIC_OR_DEPARTMENT_OTHER): Payer: Self-pay | Admitting: Family Medicine

## 2023-07-01 ENCOUNTER — Other Ambulatory Visit (HOSPITAL_COMMUNITY): Payer: Self-pay

## 2023-07-01 MED ORDER — DIPHENHYDRAMINE HCL 25 MG PO TABS
25.0000 mg | ORAL_TABLET | Freq: Four times a day (QID) | ORAL | 0 refills | Status: DC | PRN
  Filled 2023-07-01: qty 30, 8d supply, fill #0
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
## 2023-07-03 ENCOUNTER — Telehealth: Payer: 59 | Admitting: Emergency Medicine

## 2023-07-03 ENCOUNTER — Other Ambulatory Visit: Payer: Self-pay

## 2023-07-03 DIAGNOSIS — R030 Elevated blood-pressure reading, without diagnosis of hypertension: Secondary | ICD-10-CM | POA: Diagnosis not present

## 2023-07-03 NOTE — Patient Instructions (Addendum)
  Alicia Parks, thank you for joining Jon CHRISTELLA Belt, NP for today's virtual visit.  While this provider is not your primary care provider (PCP), if your PCP is located in our provider database this encounter information will be shared with them immediately following your visit.   A [facility] MyChart account gives you access to today's visit and all your visits, tests, and labs performed at [facility]  click here if you don't have a [facility] MyChart account or go to [email or web address]  Consent: (Patient) Alicia Parks provided verbal consent for this virtual visit at the beginning of the encounter.  Current Medications:  Current Outpatient Medications:    diphenhydrAMINE  (BENADRYL ) 25 MG tablet, Take 1 tablet (25 mg total) by mouth every 6 (six) hours as needed., Disp: 30 tablet, Rfl: 0   doxycycline  (VIBRA -TABS) 100 MG tablet, Take 1 tablet (100 mg total) by mouth 2 (two) times daily., Disp: 14 tablet, Rfl: 0   fluticasone  (FLONASE ) 50 MCG/ACT nasal spray, Place 1 spray into both nostrils daily., Disp: 9.9 mL, Rfl: 0   guaiFENesin -codeine  100-10 MG/5ML syrup, Take 5 mLs by mouth every 6 (six) hours as needed for cough., Disp: 120 mL, Rfl: 0   pantoprazole  (PROTONIX ) 40 MG tablet, Take 1 tablet (40 mg total) by mouth daily., Disp: 30 tablet, Rfl: 3   predniSONE  (DELTASONE ) 20 MG tablet, Take 2 tablets (40 mg total) by mouth daily., Disp: 10 tablet, Rfl: 0   Medications ordered in this encounter:  No orders of the defined types were placed in this encounter.    *If you need refills on other medications prior to your next appointment, please contact your pharmacy*  Follow-Up: Call back or seek an in-person evaluation if the symptoms worsen or if the condition fails to improve as anticipated.  [facility] [phone]  Other Instructions Continue to try to lower sodium intake.   Check your blood pressure when you are in [employer] and keep a log of  your BP values (can be paper or on your phone, etc). Send your readings to Ms. Caudle or share them with her at your next appointment.    If you have been instructed to have an in-person evaluation today at a local Urgent Care facility, please use the link below. It will take you to a list of all of our available [facility], including address, phone number and hours of operation. Please do not delay care.  [facility]  If you or a family member do not have a primary care provider, use the link below to schedule a visit and establish care. When you choose a [facility] primary care physician or advanced practice provider, you gain a long-term partner in health. Find a Primary Care Provider  Learn more about [facility] in-office and virtual care options: [facility] - Get Care Now

## 2023-07-03 NOTE — Progress Notes (Signed)
 Virtual Visit Consent   Alicia Parks, you are scheduled for a virtual visit with a [facility] provider today. Just as with appointments in the office, your consent must be obtained to participate. Your consent will be active for this visit and any virtual visit you may have with one of our providers in the next 365 days. If you have a MyChart account, a copy of this consent can be sent to you electronically.  As this is a virtual visit, video technology does not allow for your provider to perform a traditional examination. This may limit your provider's ability to fully assess your condition. If your provider identifies any concerns that need to be evaluated in person or the need to arrange testing (such as labs, EKG, etc.), we will make arrangements to do so. Although advances in technology are sophisticated, we cannot ensure that it will always work on either your end or our end. If the connection with a video visit is poor, the visit may have to be switched to a telephone visit. With either a video or telephone visit, we are not always able to ensure that we have a secure connection.  By engaging in this virtual visit, you consent to the provision of healthcare and authorize for your insurance to be billed (if applicable) for the services provided during this visit. Depending on your insurance coverage, you may receive a charge related to this service.  I need to obtain your verbal consent now. Are you willing to proceed with your visit today? Alicia Parks has provided verbal consent on 07/03/2023 for a virtual visit (video or telephone). Alicia CHRISTELLA Belt, NP  Date: 07/03/2023 1:36 PM  Virtual Visit via Video Note   I, Alicia Parks, connected with  Alicia Parks  ([id number], [birth year]) on 07/03/23 at  1:30 PM EST by a video-enabled telemedicine application and verified that I am speaking with the correct person using two identifiers.  Location: Patient: Virtual Visit Location Patient:  Home Provider: Virtual Visit Location Provider: Home Office   I discussed the limitations of evaluation and management by telemedicine and the availability of in person appointments. The patient expressed understanding and agreed to proceed.    History of Present Illness: Alicia Parks is a 48 y.o. who identifies as a female who was assigned female at birth, and is being seen today for elevated blood pressure. Checked her BP at [employer] today and it was 159/100. BP has been steadily increasing - has talked with her pcp about it - next appt is scheduled for March.   Denies stroke or CV sx. Does have intermittent dizziness, headache, and nasal congestion that she attributes to deviated septum for which she is seeing ENT, next visist in a week.   HPI: HPI  Problems:  Patient Active Problem List   Diagnosis Date Noted   Vitamin D  deficiency 05/25/2023   Prediabetes 05/25/2023   Dysfunction of right eustachian tube 05/05/2023   Lumbar pain 07/24/2021   Symptomatic cholelithiasis 05/30/2021   Cholecystitis 05/30/2021   Dental abscess 10/04/2020   GERD (gastroesophageal reflux disease) 10/04/2020   Generalized anxiety disorder 04/25/2020   Bipolar I disorder, most recent episode depressed (HCC) 04/25/2020   Moderate episode of recurrent major depressive disorder (HCC) 04/25/2020    Allergies:  Allergies  Allergen Reactions   Aspirin Other (See Comments)    gives me the jitters   Compazine [Prochlorperazine Edisylate] Other (See Comments)    Makes body move funny. Shaking  Effexor  [Venlafaxine ]     Nightmares and night sweats   Prochlorperazine     Other reaction(s): Body shakes   Tylenol  [Acetaminophen ] Nausea Only and Rash   Medications:  Current Outpatient Medications:    diphenhydrAMINE  (BENADRYL ) 25 MG tablet, Take 1 tablet (25 mg total) by mouth every 6 (six) hours as needed., Disp: 30 tablet, Rfl: 0   doxycycline  (VIBRA -TABS) 100 MG tablet, Take 1 tablet (100 mg total) by  mouth 2 (two) times daily., Disp: 14 tablet, Rfl: 0   fluticasone  (FLONASE ) 50 MCG/ACT nasal spray, Place 1 spray into both nostrils daily., Disp: 9.9 mL, Rfl: 0   guaiFENesin -codeine  100-10 MG/5ML syrup, Take 5 mLs by mouth every 6 (six) hours as needed for cough., Disp: 120 mL, Rfl: 0   pantoprazole  (PROTONIX ) 40 MG tablet, Take 1 tablet (40 mg total) by mouth daily., Disp: 30 tablet, Rfl: 3   predniSONE  (DELTASONE ) 20 MG tablet, Take 2 tablets (40 mg total) by mouth daily., Disp: 10 tablet, Rfl: 0  Observations/Objective: Patient is well-developed, well-nourished in no acute distress.  Resting comfortably at home.  Head is normocephalic, atraumatic.  No labored breathing.  Speech is clear and coherent with logical content.  Patient is alert and oriented at baseline.    Assessment and Plan: 1. Elevated blood pressure reading (Primary)  Monitor BP at home/[facility] and f/u with PCP with readings   Follow Up Instructions: I discussed the assessment and treatment plan with the patient. The patient was provided an opportunity to ask questions and all were answered. The patient agreed with the plan and demonstrated an understanding of the instructions.  A copy of instructions were sent to the patient via MyChart unless otherwise noted below.    The patient was advised to call back or seek an in-person evaluation if the symptoms worsen or if the condition fails to improve as anticipated.    Alicia CHRISTELLA Belt, NP

## 2023-07-06 ENCOUNTER — Ambulatory Visit (INDEPENDENT_AMBULATORY_CARE_PROVIDER_SITE_OTHER): Payer: 59 | Admitting: Psychiatry

## 2023-07-06 ENCOUNTER — Encounter (HOSPITAL_COMMUNITY): Payer: Self-pay | Admitting: Psychiatry

## 2023-07-06 DIAGNOSIS — F411 Generalized anxiety disorder: Secondary | ICD-10-CM | POA: Diagnosis not present

## 2023-07-06 DIAGNOSIS — F331 Major depressive disorder, recurrent, moderate: Secondary | ICD-10-CM

## 2023-07-06 DIAGNOSIS — F4310 Post-traumatic stress disorder, unspecified: Secondary | ICD-10-CM | POA: Diagnosis not present

## 2023-07-06 MED ORDER — DULOXETINE HCL 20 MG PO CPEP: 20.0000 mg | ORAL_CAPSULE | Freq: Every day | ORAL | 1 refills | Status: DC

## 2023-07-06 MED ORDER — HYDROXYZINE HCL 25 MG PO TABS: 25.0000 mg | ORAL_TABLET | Freq: Two times a day (BID) | ORAL | 1 refills | Status: DC | PRN

## 2023-07-06 NOTE — Patient Instructions (Signed)
 Thank you for attending your appointment today.  -- START Cymbalta  20 mg daily -- START Atarax  25 mg twice daily as needed for acute anxiety/sleep -- Continue other medications as prescribed.  Please do not make any changes to medications without first discussing with your provider. If you are experiencing a psychiatric emergency, please call 911 or present to your nearest emergency department. Additional crisis, medication management, and therapy resources are included below.  [facility]  [address] [place], [place] [address] [phone] WALK-IN URGENT CARE 24/7 FOR ANYONE [address] [place], [place]  [phone] Fax: [phone] [email or web address] *Interpreters available *Accepts all insurance and uninsured for Urgent Care needs *Accepts Medicaid and uninsured for outpatient treatment (below)      ONLY FOR [facility]  Below:    Outpatient New Patient Assessment/Therapy Walk-ins:        Monday, Wednesday, and Thursday 8am until slots are full (first come, first served)                   New Patient Psychiatry/Medication Management        Monday-Friday 8am-11am (first come, first served)               For all walk-ins we ask that you arrive by 7:15am, because patients will be seen in the order of arrival.

## 2023-07-07 ENCOUNTER — Encounter (HOSPITAL_BASED_OUTPATIENT_CLINIC_OR_DEPARTMENT_OTHER): Payer: 59 | Admitting: Certified Nurse Midwife

## 2023-07-07 ENCOUNTER — Encounter (HOSPITAL_BASED_OUTPATIENT_CLINIC_OR_DEPARTMENT_OTHER): Payer: Self-pay

## 2023-07-23 IMAGING — US US ABDOMEN LIMITED
1 series · 14 of 25 positions shown · non-contrast
Comparison: Abdominal ultrasound 12/31/2016

CLINICAL DATA: Right upper quadrant pain for several weeks, nausea

EXAM:
ULTRASOUND ABDOMEN LIMITED RIGHT UPPER QUADRANT

[Series 1: us abdomen limited ruq (liver/gb) · 14 of 98 slices shown]
[im 1/98]
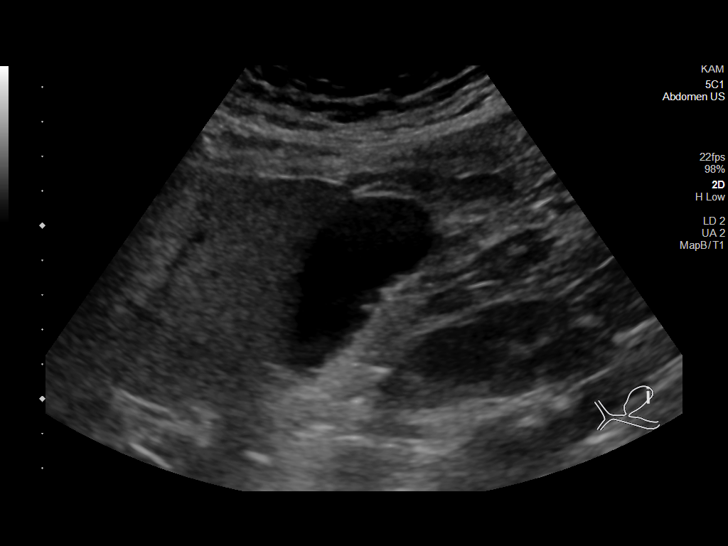
[im 9/98]
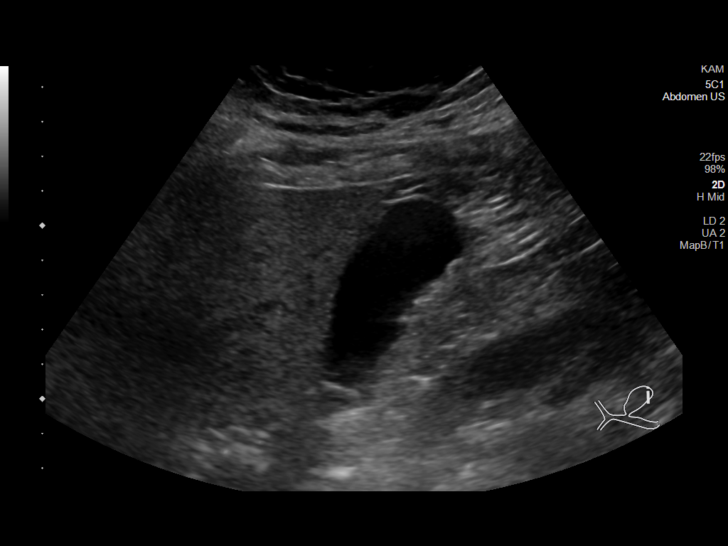
[im 17/98]
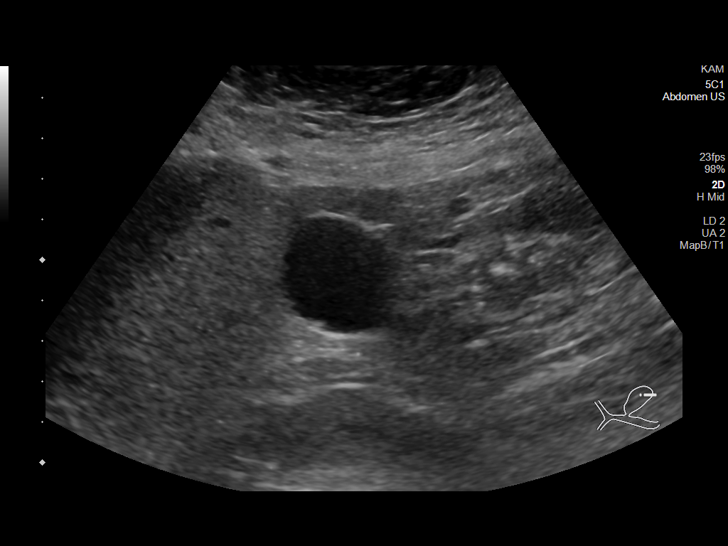
[im 25/98]
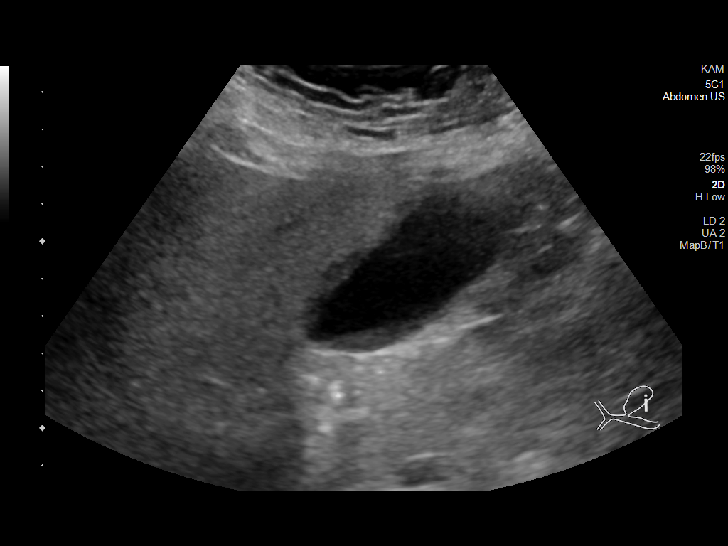
[im 33/98]
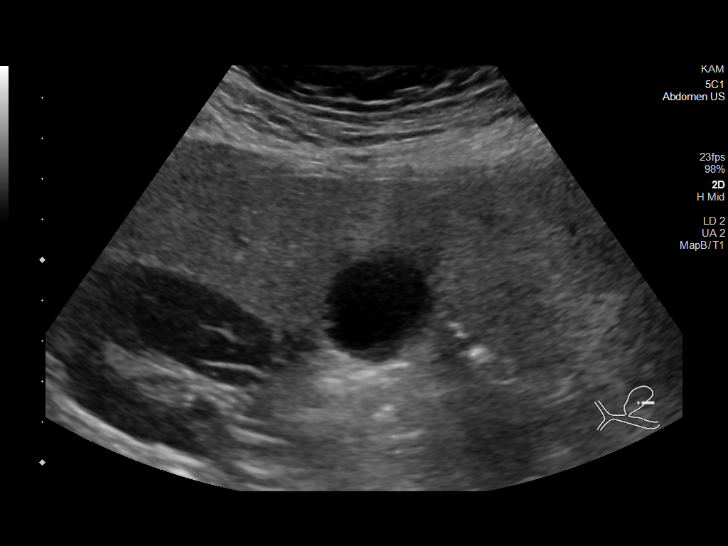
[im 37/98]
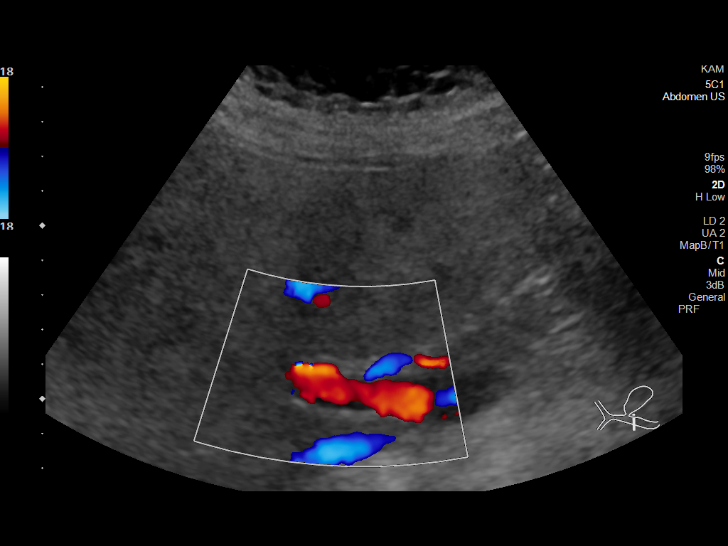
[im 45/98]
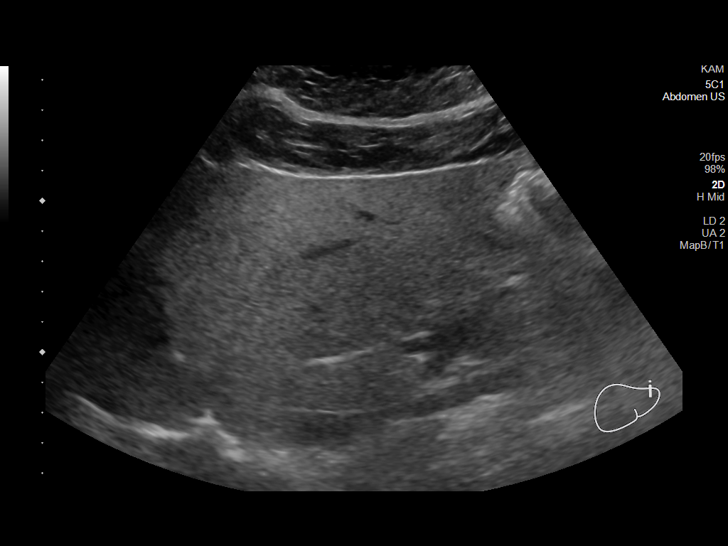
[im 53/98]
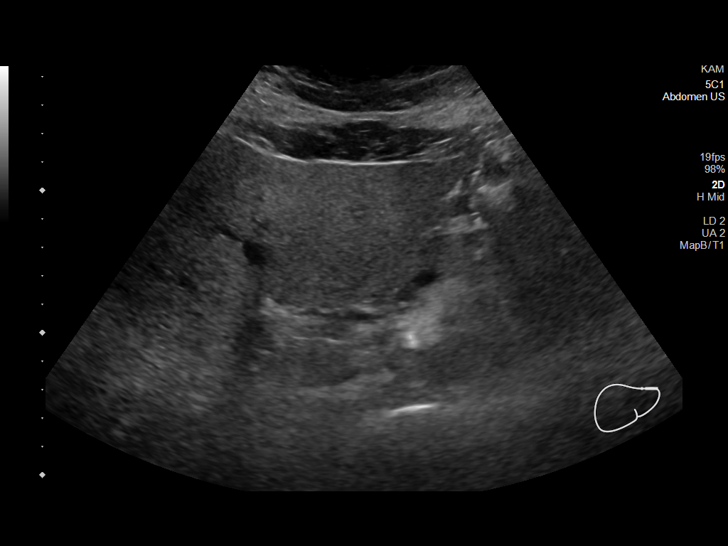
[im 61/98]
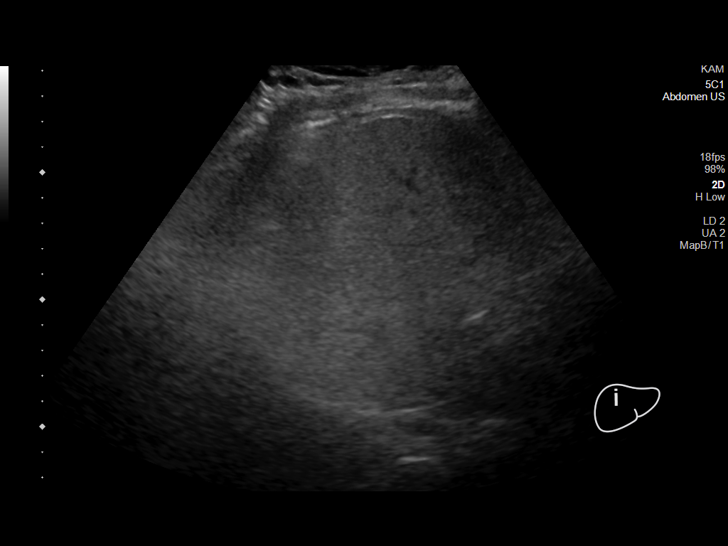
[im 65/98]
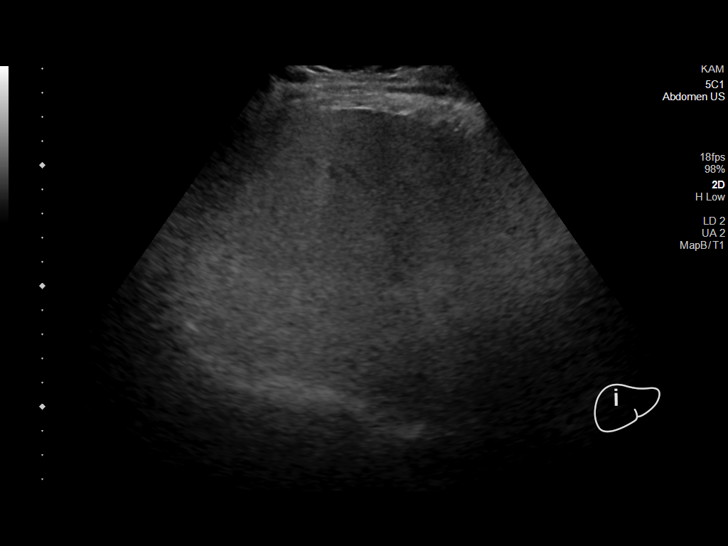
[im 73/98]
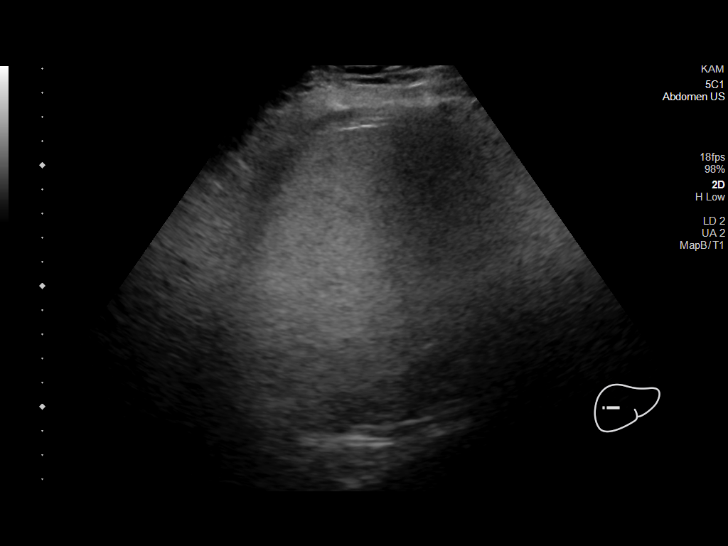
[im 81/98]
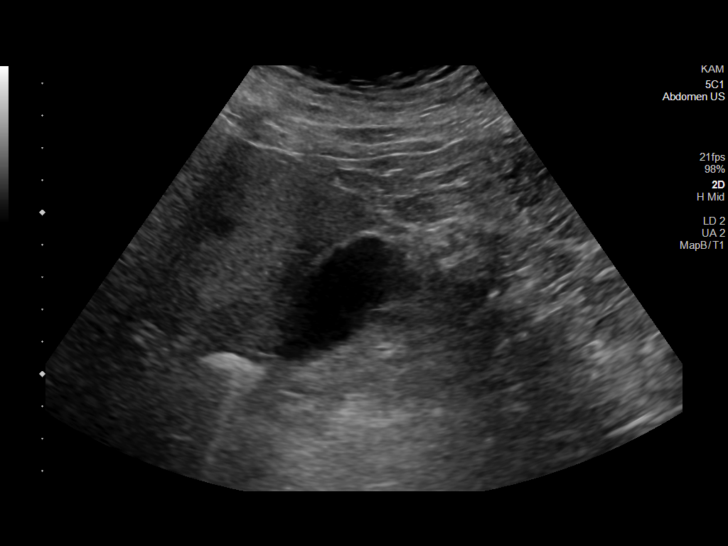
[im 89/98]
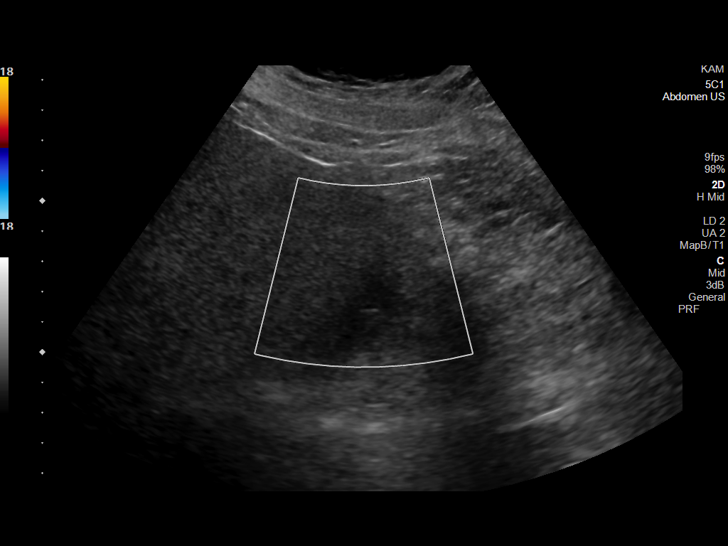
[im 98/98]
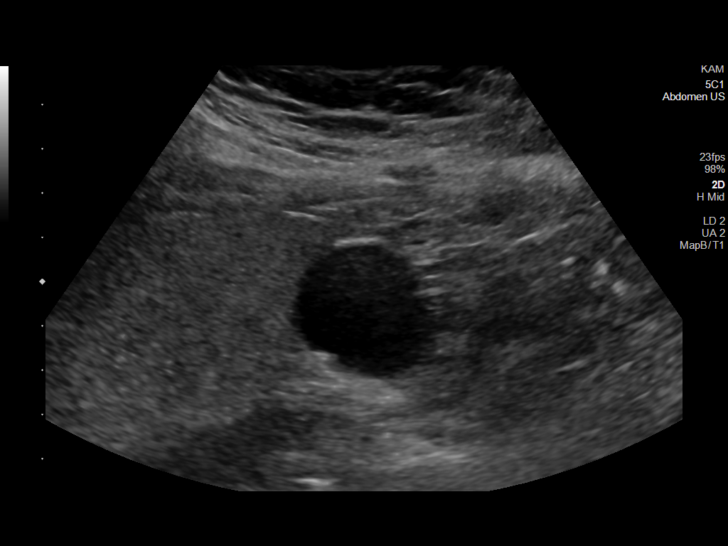

[14 of 25 positions shown; findings below may reference images not displayed]

FINDINGS: Gallbladder:

Small mobile gallstones are seen within the gallbladder lumen
measuring up to 0.8 cm as well as layering sludge. There is no
gallbladder wall thickening or pericholecystic fluid. No sonographic
Murphy sign was reported by the sonographer.

Common bile duct:

Diameter: 2 mm

Liver:

Parenchymal echogenicity is diffusely increased. No focal lesion is
identified. Portal vein is patent on color Doppler imaging with
normal direction of blood flow towards the liver.

Other: None.
IMPRESSION: 1. Cholelithiasis and gallbladder sludge without evidence of acute
cholecystitis.
2. Hepatic steatosis.

## 2023-07-23 IMAGING — DX DG CHEST 2V
2 series · 2 of 2 positions shown · non-contrast
Comparison: Chest radiograph 03/16/2021

CLINICAL DATA: Patient reports to the ER after endoscopy, states
she has been having right sided tingling in her arm that has turned
into pain and has continued following a recent endoscopy

EXAM:
CHEST - 2 VIEW

[chest pa]
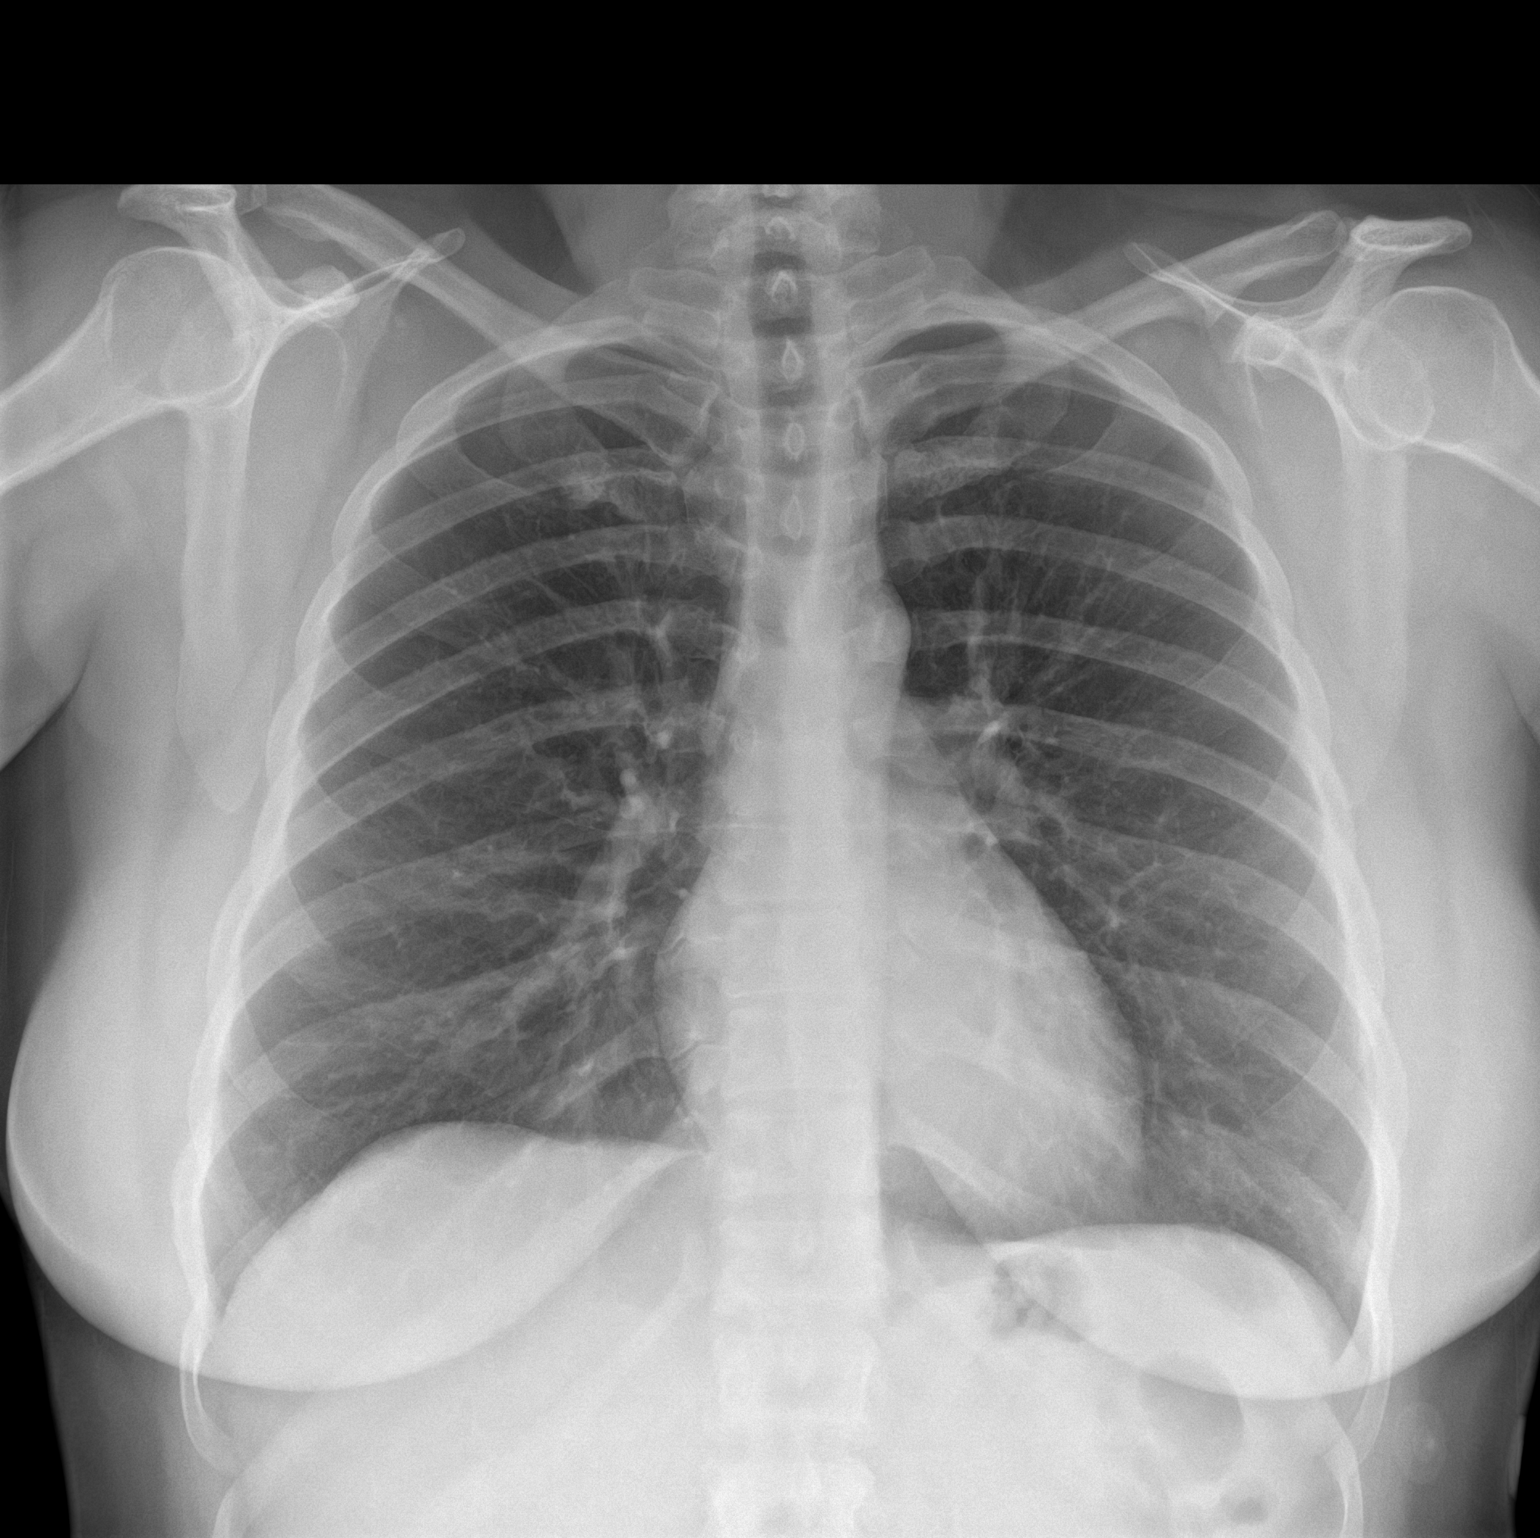

[chest lat]
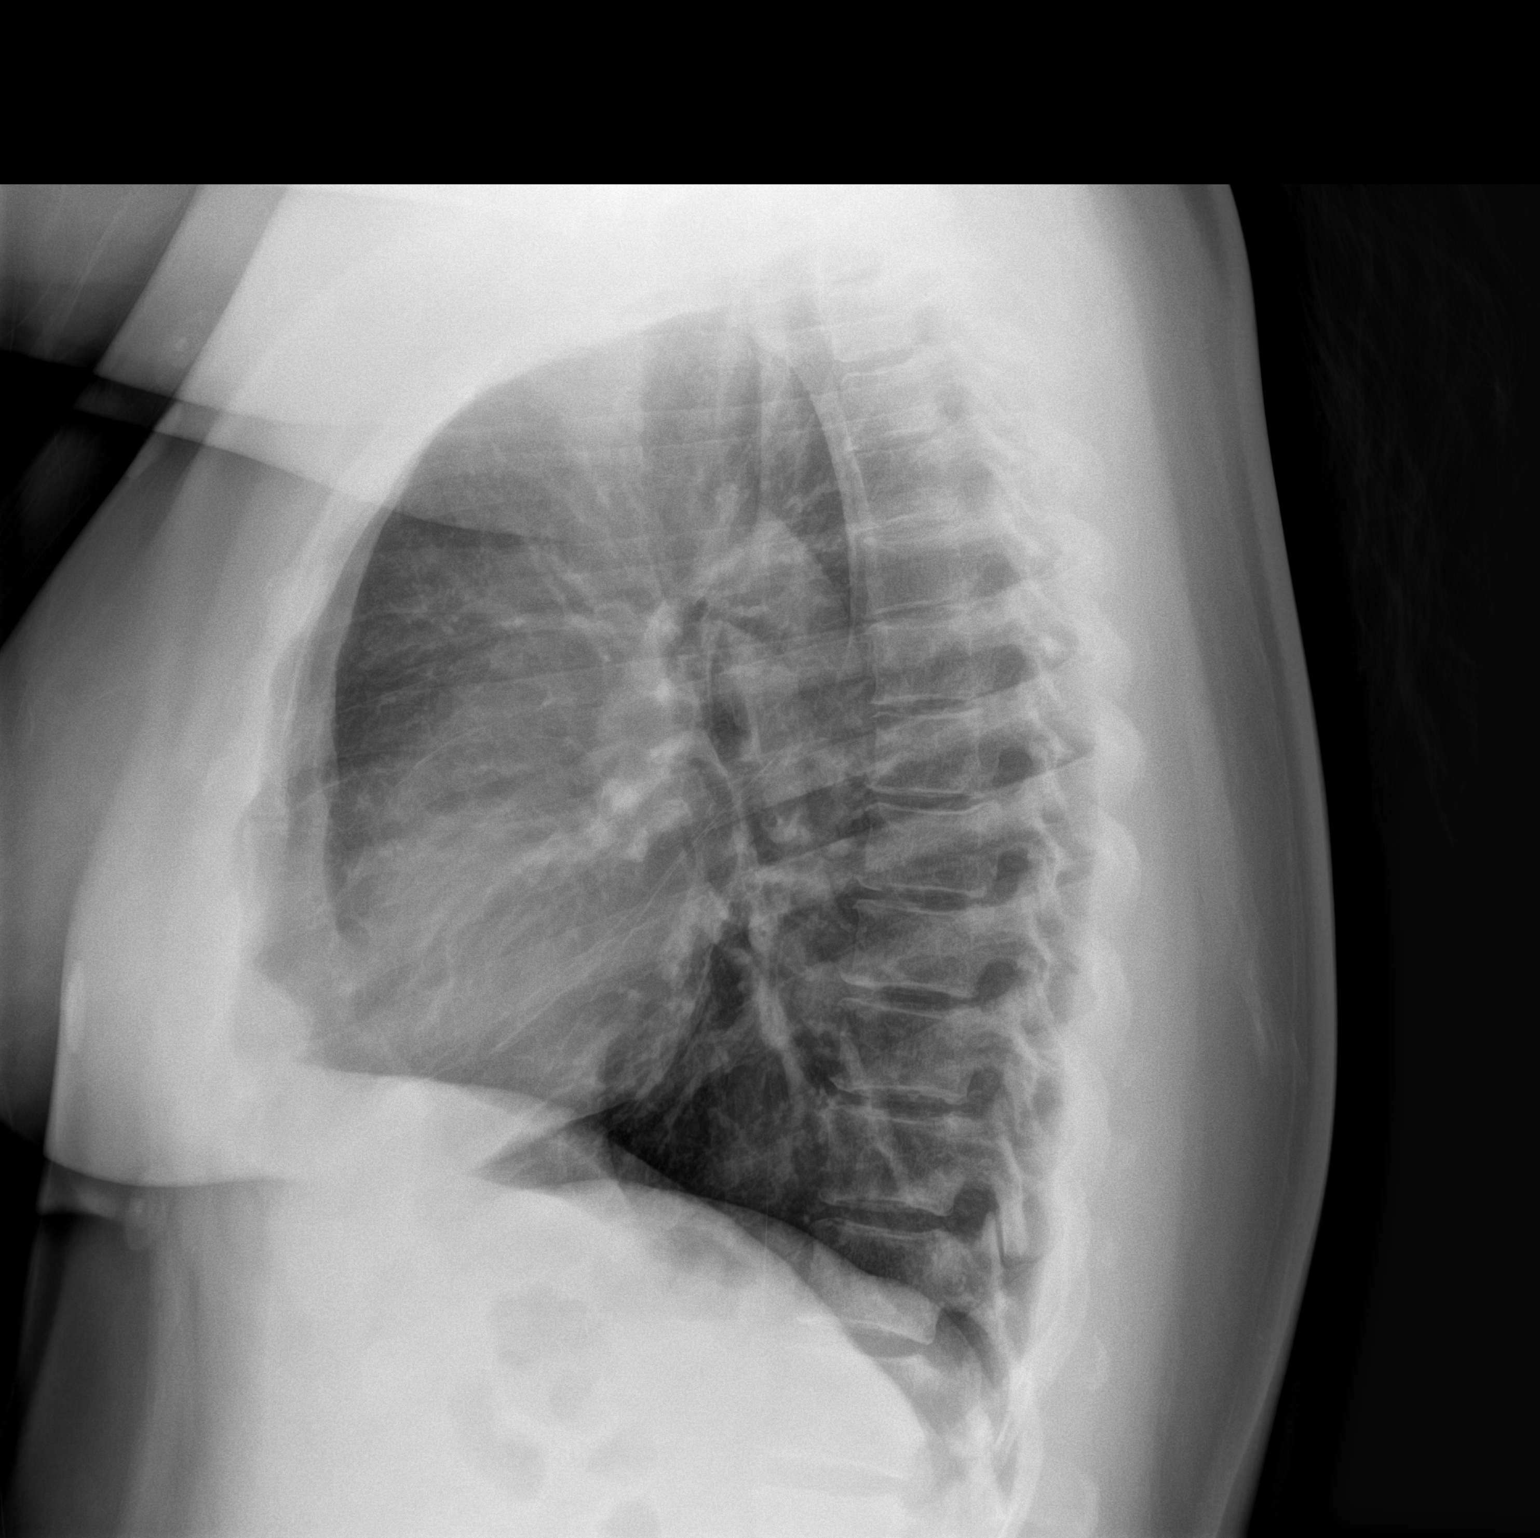

[2 of 2 positions shown; findings below may reference images not displayed]

FINDINGS: The cardiomediastinal contours are within normal limits. The lungs
are clear. No pneumothorax or pleural effusion. No acute finding in
the visualized skeleton.
IMPRESSION: No acute cardiopulmonary process.

## 2023-07-28 ENCOUNTER — Other Ambulatory Visit (HOSPITAL_COMMUNITY): Payer: Self-pay | Admitting: Psychiatry

## 2023-07-30 NOTE — Progress Notes (Deleted)
 48 y.o. G20P3003 Married White or Caucasian female here for annual exam.    No LMP recorded. Patient has had an ablation.          Sexually active: {yes no:314532}  The current method of family planning is {contraception:315051}.     The pregnancy intention screening data noted above was reviewed. Potential methods of contraception were discussed. The patient elected to proceed with No data recorded.  Exercising: {yes no:314532}  {types:19826} Smoker:  {YES NO:22349}  Health Maintenance: Pap:  *** History of abnormal Pap:  {YES NO:22349} MMG:  *** Colonoscopy:  *** BMD:   *** Screening Labs: ***   reports that she has never smoked. She has never used smokeless tobacco. She reports that she does not currently use alcohol. She reports current drug use. Drug: Marijuana.  Past Medical History:  Diagnosis Date   Anxiety 2012   So bad it keeps me in the house   Arthritis    Back pain    Complication of anesthesia    Depression Birth   Ear drum perforation    Fibromyalgia    GERD (gastroesophageal reflux disease)    Migraine    PTSD (post-traumatic stress disorder)    Restless leg syndrome    Ulcer 2018    Past Surgical History:  Procedure Laterality Date   CHOLECYSTECTOMY N/A 05/30/2021   Procedure: LAPAROSCOPIC CHOLECYSTECTOMY WITH ICG DYE;  Surgeon: [name], MD;  Location: [facility] OR;  Service: General;  Laterality: N/A;   TONSILLECTOMY     TUBAL LIGATION      Current Outpatient Medications  Medication Sig Dispense Refill   cholecalciferol (VITAMIN D3) 25 MCG (1000 UNIT) tablet Take 1,000 Units by mouth daily.     diphenhydrAMINE (BENADRYL) 25 MG tablet Take 1 tablet (25 mg total) by mouth every 6 (six) hours as needed. (Patient taking differently: Take 25 mg by mouth every 6 (six) hours as needed for allergies.) 30 tablet 0   DULoxetine (CYMBALTA) 20 MG capsule Take 1 capsule (20 mg total) by mouth daily. 30 capsule 1   hydrOXYzine (ATARAX) 25 MG tablet Take 1  tablet (25 mg total) by mouth 2 (two) times daily as needed for anxiety (sleep). 60 tablet 1   pantoprazole (PROTONIX) 40 MG tablet Take 1 tablet (40 mg total) by mouth daily. 30 tablet 3   vitamin B-12 (CYANOCOBALAMIN) 100 MCG tablet Take 100 mcg by mouth daily.     No current facility-administered medications for this visit.    Family History  Problem Relation Age of Onset   Osteoporosis Mother    Arthritis Mother    Hearing loss Mother    Miscarriages / [place] Mother    Alcohol abuse Mother    Hypertension Father    Heart disease Father    Gout Father    Anxiety disorder Father    Depression Father    Stroke Father    Alcohol abuse Maternal Grandmother    Cancer Other     ROS: Constitutional: {Findings; ROS constitutional:30497::"negative"} Genitourinary:{Findings; ROS genitourinary:19593::"negative"}  Exam:   There were no vitals taken for this visit.     General appearance: alert, cooperative and appears stated age Head: Normocephalic, without obvious abnormality, atraumatic Neck: no adenopathy, supple, symmetrical, trachea midline and thyroid {EXAM; THYROID:18604} Lungs: clear to auscultation bilaterally Breasts: {Exam; breast:13139::"normal appearance, no masses or tenderness"} Heart: regular rate and rhythm Abdomen: soft, non-tender; bowel sounds normal; no masses,  no organomegaly Extremities: extremities normal, atraumatic, no cyanosis or edema Skin:  Skin color, texture, turgor normal. No rashes or lesions Lymph nodes: Cervical, supraclavicular, and axillary nodes normal. No abnormal inguinal nodes palpated Neurologic: Grossly normal   Pelvic: External genitalia:  no lesions              Urethra:  normal appearing urethra with no masses, tenderness or lesions              Bartholins and Skenes: normal                 Vagina: normal appearing vagina with normal color and no discharge, no lesions              Cervix: {exam; cervix:14595}              Pap  taken: {yes no:314532} Bimanual Exam:  Uterus:  {exam; uterus:12215}              Adnexa: {exam; adnexa:12223}               Rectovaginal: Confirms               Anus:  normal sphincter tone, no lesions  Chaperone, ***, CMA, was present for exam.  Assessment/Plan:

## 2023-08-04 ENCOUNTER — Encounter (HOSPITAL_BASED_OUTPATIENT_CLINIC_OR_DEPARTMENT_OTHER): Payer: Self-pay

## 2023-08-04 ENCOUNTER — Encounter (HOSPITAL_BASED_OUTPATIENT_CLINIC_OR_DEPARTMENT_OTHER): Payer: 59 | Admitting: Certified Nurse Midwife
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
## 2023-08-11 ENCOUNTER — Encounter (HOSPITAL_COMMUNITY): Payer: Self-pay | Admitting: Psychiatry

## 2023-08-11 ENCOUNTER — Telehealth (INDEPENDENT_AMBULATORY_CARE_PROVIDER_SITE_OTHER): Payer: 59 | Admitting: Psychiatry

## 2023-08-11 DIAGNOSIS — F331 Major depressive disorder, recurrent, moderate: Secondary | ICD-10-CM

## 2023-08-11 DIAGNOSIS — F4310 Post-traumatic stress disorder, unspecified: Secondary | ICD-10-CM | POA: Diagnosis not present

## 2023-08-11 DIAGNOSIS — R7303 Prediabetes: Secondary | ICD-10-CM | POA: Diagnosis not present

## 2023-08-11 DIAGNOSIS — E559 Vitamin D deficiency, unspecified: Secondary | ICD-10-CM

## 2023-08-11 DIAGNOSIS — F411 Generalized anxiety disorder: Secondary | ICD-10-CM | POA: Diagnosis not present

## 2023-08-11 DIAGNOSIS — Z658 Other specified problems related to psychosocial circumstances: Secondary | ICD-10-CM

## 2023-08-11 MED ORDER — PRAZOSIN HCL 1 MG PO CAPS: 1.0000 mg | ORAL_CAPSULE | Freq: Every day | ORAL | 1 refills | Status: DC

## 2023-08-11 MED ORDER — ESCITALOPRAM OXALATE 5 MG PO TABS: 5.0000 mg | ORAL_TABLET | Freq: Every day | ORAL | 1 refills | Status: DC

## 2023-08-11 NOTE — Patient Instructions (Signed)
 Thank you for attending your appointment today.  -- START Lexapro 5 mg daily  -- If you are experiencing side effects to this medication, you may take 1/2 tablet for 1 week then increase to the full tablet -- START Prazosin 1 mg nightly -- Continue other medications as prescribed.  Please do not make any changes to medications without first discussing with your provider. If you are experiencing a psychiatric emergency, please call 911 or present to your nearest emergency department. Additional crisis, medication management, and therapy resources are included below.  [facility]  [address], [place], [place] [address] [phone] WALK-IN URGENT CARE 24/7 FOR ANYONE [address], [place], [place]  [phone] Fax: [phone] [email or web address] *Interpreters available *Accepts all insurance and uninsured for Urgent Care needs *Accepts Medicaid and uninsured for outpatient treatment (below)      ONLY FOR [facility]  Below:    Outpatient New Patient Assessment/Therapy Walk-ins:        Monday, Wednesday, and Thursday 8am until slots are full (first come, first served)                   New Patient Psychiatry/Medication Management        Monday-Friday 8am-11am (first come, first served)               For all walk-ins we ask that you arrive by 7:15am, because patients will be seen in the order of arrival.

## 2023-08-12 ENCOUNTER — Telehealth: Payer: Self-pay | Admitting: *Deleted

## 2023-08-12 NOTE — Progress Notes (Signed)
 Complex Care Management Note  Care Guide Note 08/12/2023 Name: DORLA GUIZAR MRN: [id number] DOB: Nov 10, 1975  AMIRAH GOERKE is a 48 y.o. year old female who sees Caudle, Shelton Silvas, FNP for primary care. I reached out to [name] by phone today to offer complex care management services.  Ms. Vinal was given information about Complex Care Management services today including:   The Complex Care Management services include support from the care team which includes your Nurse Care Manager, Clinical Social Worker, or Pharmacist.  The Complex Care Management team is here to help remove barriers to the health concerns and goals most important to you. Complex Care Management services are voluntary, and the patient may decline or stop services at any time by request to their care team member.   Complex Care Management Consent Status: Patient agreed to services and verbal consent obtained.   Follow up plan:  Telephone appointment with complex care management team member scheduled for:  2/24  Encounter Outcome:  Patient Scheduled  Provided patient with contact information for Care Management Team for [facility], [phone], TTY: [phone]  [address] Ghazi  [facility]  [facility] Guide  Direct Dial: [phone]  Fax [phone]

## 2023-08-17 ENCOUNTER — Ambulatory Visit: Payer: Self-pay | Admitting: Licensed Clinical Social Worker

## 2023-08-17 NOTE — Patient Outreach (Signed)
 Care Coordination   Initial Visit Note   08/17/2023 Name: Alicia Parks MRN: [id number] DOB: 1976-05-18  Alicia Parks is a 48 y.o. year old female who sees Caudle, Shelton Silvas, FNP for primary care. I spoke with  [name] by phone today.  What matters to the patients health and wellness today?  client said she has stress related to housing needs and medical needs. She has financial strain     Goals Addressed             This Visit's Progress    client said she has stress related to housing needs and medical needs. She has financial strain       Interventions:  Spoke with client via phone today about her needs and status Spoke with Antwan about medication needs. 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 She said she has headaches fairly often Discussed mental health needs. She said she sees psychiatrist , has had 2 video visits with psychiatrist  She is scheduled  to talk with therapist/counselor in March of 2025 Discussed transport needs of client She said she has occasional swelling in her legs and tries to manage edema issues She said she has hernia and would like to see medical provider about hernia care Discussed vision of client.  She has some blurry vision out of left eye. Her left eye waters fairly often Regarding sleeping, she said she has reduced sleep occasionally Discussed headaches of client Discussed stress level of client. She said she is hypervigilant. She said she may bite her nails occasionally. She said she is taking Lexapro as prescribed. She feels that Lexapro is helping her Discussed relaxation and hobbies of client. She likes to listen to music to relax.  She uses headphones to listen to music. She likes to walk. She likes watching sports games Discussed  finances as sometimes stressful. It is sometimes hard for her to make rent payment as needed. She does go to [employer] regularly for food help.She also goes to food pantry on [address] for [employer] is close to where she lives. She said she can use bus as needed for transport help Client has Medicaid. She said she has not received a call from [facility].  Client is concerned about her current needs.  Regarding family support, she has reduced family support. She resides with her boyfriend who provides some support for client Client is looking forward to speaking with counselor in March of 2025.   Client said she has worked in [employer] for years.   Client has anxiety when in a crowd.  She said she does not like loud areas.  She spoke of stress inf 2022 related to deaths in her family. Used Active Listening techniques to hear needs of client Thanked client for phone call with LCSW Encouraged client to call LCSW as needed for SW support at [phone]. Client was appreciative of call from LCSW today           SDOH assessments and interventions completed:  Yes  SDOH Interventions Today    Flowsheet Row Most Recent Value  SDOH Interventions   Depression Interventions/Treatment  Currently on Treatment  Physical Activity Interventions Other (Comments)  [client gets some swelling in her legs occasinally]  Stress Interventions Other (Comment)  [has stress related to housing needs and financial needs]        Care Coordination Interventions:  Yes, provided   Interventions Today    Flowsheet Row Most Recent Value  Chronic Disease   Chronic disease during today's visit Other  [spoke with client about client needs]  General Interventions   General Interventions Discussed/Reviewed General Interventions Discussed, Community Resources  Education Interventions   Education Provided Provided Education  Provided Verbal Education On [employer]  Mental  Health Interventions   Mental Health Discussed/Reviewed Coping Strategies  [has anxiety issues, is currently on Treatment with Psychiatrist]  Nutrition Interventions   Nutrition Discussed/Reviewed Nutrition Discussed  Pharmacy Interventions   Pharmacy Dicussed/Reviewed Pharmacy Topics Discussed        Follow up plan: LCSW has provided client with LCSW name and phone number and encouraged Lilyanah to call LCSW for SW support as needed at [phone]   Encounter Outcome:  Patient Visit Completed    Lorna Few  MSW, LCSW [place] Licensed Clinical Social Worker Direct Dial:  [phone] Fax:  [phone] Website:  [email or web address]

## 2023-08-17 NOTE — Patient Instructions (Signed)
 Visit Information  Thank you for taking time to visit with me today. Please don't hesitate to contact me if I can be of assistance to you.   Following are the goals we discussed today:   Goals Addressed             This Visit's Progress    client said she has stress related to housing needs and medical needs. She has financial strain       Interventions:  Spoke with client via phone today about her needs and status Spoke with Jadae about medication needs. 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 She said she has headaches fairly often Discussed mental health needs. She said she sees psychiatrist , has had 2 video visits with psychiatrist  She is scheduled  to talk with therapist/counselor in March of 2025 Discussed transport needs of client She said she has occasional swelling in her legs and tries to manage edema issues She said she has hernia and would like to see medical provider about hernia care Discussed vision of client.  She has some blurry vision out of left eye. Her left eye waters fairly often Regarding sleeping, she said she has reduced sleep occasionally Discussed headaches of client Discussed stress level of client. She said she is hypervigilant. She said she may bite her nails occasionally. She said she is taking Lexapro as prescribed. She feels that Lexapro is helping her Discussed relaxation and hobbies of client. She likes to listen to music to relax.  She uses headphones to listen to music. She likes to walk. She likes watching sports games Discussed finances as sometimes stressful. It is sometimes hard for her to make rent payment as needed. She does go to [employer] regularly for food help.She also goes to food pantry on [address] for [employer] is close  to where she lives. She said she can use bus as needed for transport help Client has Medicaid. She said she has not received a call from [facility].  Client is concerned about her current needs.  Regarding family support, she has reduced family support. She resides with her boyfriend who provides some support for client Client is looking forward to speaking with counselor in March of 2025.   Client said she has worked in [employer] for years.   Client has anxiety when in a crowd.  She said she does not like loud areas.  She spoke of stress inf 2022 related to deaths in her family. Used Active Listening techniques to hear needs of client Thanked client for phone call with LCSW Encouraged client to call LCSW as needed for SW support at [phone]. Client was appreciative of call from LCSW today          LCSW has provided client with LCSW name and phone number and encouraged Jenavee to call LCSW as needed for SW support at [phone]  Please call the care guide team at [phone] if you need to cancel or reschedule your appointment.   If you are experiencing a Mental Health or Behavioral Health Crisis or need someone to talk to, please go to [facility] [address] [place] [phone])   The patient verbalized understanding of instructions, educational materials, and care plan  provided today and DECLINED offer to receive copy of patient instructions, educational materials, and care plan.   The patient has been provided with contact information for the care management team and has been advised to call with any health related questions or concerns.    Lorna Few  MSW, LCSW [facility] Licensed Clinical Social Worker Direct Dial:  [phone] Fax:  [phone] Website:  [email or web address]

## 2023-08-20 ENCOUNTER — Other Ambulatory Visit (HOSPITAL_BASED_OUTPATIENT_CLINIC_OR_DEPARTMENT_OTHER): Payer: Self-pay | Admitting: Family Medicine

## 2023-08-20 MED ORDER — PANTOPRAZOLE SODIUM 40 MG PO TBEC: 40.0000 mg | DELAYED_RELEASE_TABLET | Freq: Every day | ORAL | 3 refills | Status: DC

## 2023-08-20 NOTE — Telephone Encounter (Signed)
 Copied from CRM [id number]. Topic: Clinical - Medication Refill >> Aug 20, 2023  8:07 AM Elle L wrote: Most Recent Primary Care Visit:  Provider: Hilbert Bible  Department: [facility]  Visit Type: NEW PATIENT  Date: 04/24/2023  Medication: pantoprazole (PROTONIX) 40 MG tablet  Has the patient contacted their pharmacy? Yes  Is this the correct pharmacy for this prescription? Yes If no, delete pharmacy and type the correct one.  This is the patient's preferred pharmacy:  [id number] [place], [place] - [address] AT [facility] [address] Alicia [place] [address] Phone: [phone] Fax: [phone]  Has the prescription been filled recently? No  Is the patient out of the medication? Yes, 4 left.   Has the patient been seen for an appointment in the last year OR does the patient have an upcoming appointment? Yes  Can we respond through MyChart? Yes  Agent: Please be advised that Rx refills may take up to 3 business days. We ask that you follow-up with your pharmacy.

## 2023-08-20 NOTE — Telephone Encounter (Signed)
 Rx has been sent to pharmacy for pt.

## 2023-08-31 ENCOUNTER — Ambulatory Visit (HOSPITAL_COMMUNITY): Payer: MEDICAID | Admitting: Mental Health

## 2023-08-31 ENCOUNTER — Telehealth (HOSPITAL_COMMUNITY): Payer: Self-pay | Admitting: Mental Health

## 2023-08-31 ENCOUNTER — Encounter (HOSPITAL_COMMUNITY): Payer: Self-pay

## 2023-08-31 NOTE — Telephone Encounter (Signed)
 Therapist sent link for tele-assessment. No response. Therapist contact pt via telephone, who answered and reported unable to hold virtual appointment due to lack of data or wifi for ability to have virtual appointment. Rescheduled for 5/2 @ 8am virtual. Educated on therapist walk in availability on Thursdays

## 2023-09-03 ENCOUNTER — Other Ambulatory Visit (HOSPITAL_COMMUNITY): Payer: Self-pay | Admitting: Psychiatry

## 2023-09-07 ENCOUNTER — Encounter (HOSPITAL_COMMUNITY): Payer: Self-pay

## 2023-09-07 ENCOUNTER — Other Ambulatory Visit (HOSPITAL_COMMUNITY): Payer: MEDICAID

## 2023-09-14 ENCOUNTER — Encounter (HOSPITAL_BASED_OUTPATIENT_CLINIC_OR_DEPARTMENT_OTHER): Payer: MEDICAID | Admitting: Family Medicine

## 2023-09-24 NOTE — Progress Notes (Signed)
 Patient did not connect for virtual psychiatric medication management appointment on 09/28/23 at 10AM. Sent secure video link with no response. Left VM with callback number to reschedule.  Daine Gip, MD 09/28/23

## 2023-09-28 ENCOUNTER — Encounter (HOSPITAL_BASED_OUTPATIENT_CLINIC_OR_DEPARTMENT_OTHER): Payer: 59 | Admitting: Certified Nurse Midwife

## 2023-09-28 ENCOUNTER — Encounter (HOSPITAL_BASED_OUTPATIENT_CLINIC_OR_DEPARTMENT_OTHER): Payer: Self-pay

## 2023-09-28 ENCOUNTER — Encounter (HOSPITAL_COMMUNITY): Payer: MEDICAID | Admitting: Psychiatry

## 2023-09-28 NOTE — Progress Notes (Deleted)
 ANNUAL EXAM Patient name: Alicia Parks MRN [id number]  Date of birth: February 06, 1976 Chief Complaint:   No chief complaint on file.  History of Present Illness:   Alicia Parks is a 48 y.o. G62P3003 {race:25618} female being seen today for a routine annual exam.  Current complaints: ***  No LMP recorded. Patient has had an ablation.   The pregnancy intention screening data noted above was reviewed. Potential methods of contraception were discussed. The patient elected to proceed with No data recorded.   Last pap ***. Results were: {Pap findings:25134}. H/O abnormal pap: {yes/yes***/no:23866} Last mammogram: ***. Results were: {normal, abnormal, n/a:23837}. Family h/o breast cancer: {yes***/no:23838} Last colonoscopy: ***. Results were: {normal, abnormal, n/a:23837}. Family h/o colorectal cancer: {yes***/no:23838}     08/17/2023   10:21 AM 04/24/2023   10:26 AM 05/22/2021    9:36 AM 02/13/2021    2:53 PM 10/04/2020    9:03 AM  Depression screen PHQ 2/9  Decreased Interest 1 3   1   Down, Depressed, Hopeless 1 3   2   PHQ - 2 Score 2 6   3   Altered sleeping 1 3   3   Tired, decreased energy 1 3   3   Change in appetite 1 3   0  Feeling bad or failure about yourself  1 3   1   Trouble concentrating 1 0   0  Moving slowly or fidgety/restless 1 0   1  Suicidal thoughts 0 3   1  PHQ-9 Score 8 21   12   Difficult doing work/chores Somewhat difficult Very difficult        Information is confidential and restricted. Go to Review Flowsheets to unlock data.        04/24/2023   10:27 AM 05/22/2021    9:38 AM 02/13/2021    2:56 PM 10/04/2020    9:06 AM  GAD 7 : Generalized Anxiety Score  Nervous, Anxious, on Edge 3   1  Control/stop worrying 3   1  Worry too much - different things 3   3  Trouble relaxing 3   3  Restless 1   3  Easily annoyed or irritable 3   2  Afraid - awful might happen 3   0  Total GAD 7 Score 19   13  Anxiety Difficulty Extremely difficult        Information is  confidential and restricted. Go to Review Flowsheets to unlock data.     Review of Systems:   Pertinent items are noted in HPI Denies any headaches, blurred vision, fatigue, shortness of breath, chest pain, abdominal pain, abnormal vaginal discharge/itching/odor/irritation, problems with periods, bowel movements, urination, or intercourse unless otherwise stated above. Pertinent History Reviewed:  Reviewed past medical,surgical, social and family history.  Reviewed problem list, medications and allergies. Physical Assessment:  There were no vitals filed for this visit.There is no height or weight on file to calculate BMI.        Physical Examination:   General appearance - well appearing, and in no distress  Mental status - alert, oriented to person, place, and time  Psych:  She has a normal mood and affect  Skin - warm and dry, normal color, no suspicious lesions noted  Chest - effort normal, all lung fields clear to auscultation bilaterally  Heart - normal rate and regular rhythm  Neck:  midline trachea, no thyromegaly or nodules  Breasts - breasts appear normal, no suspicious masses, no skin or nipple changes or  axillary nodes  Abdomen - soft, nontender, nondistended, no masses or organomegaly  Pelvic - VULVA: normal appearing vulva with no masses, tenderness or lesions  VAGINA: normal appearing vagina with normal color and discharge, no lesions  CERVIX: normal appearing cervix without discharge or lesions, no CMT  Thin prep pap is {Desc; done/not:10129} *** HR HPV cotesting  UTERUS: uterus is felt to be normal size, shape, consistency and nontender   ADNEXA: No adnexal masses or tenderness noted.  Rectal - normal rectal, good sphincter tone, no masses felt. Hemoccult: ***  Extremities:  No swelling or varicosities noted  Chaperone present for exam  No results found for this or any previous visit (from the past 24 hours).  Assessment & Plan:  1) Well-Woman Exam  2)  ***  Labs/procedures today: ***  Mammogram: {Mammo f/u:25212::"@ 48yo"}, or sooner if problems Colonoscopy: {TCS f/u:25213::"@ 48yo"}, or sooner if problems  No orders of the defined types were placed in this encounter.   Meds: No orders of the defined types were placed in this encounter.   Follow-up: No follow-ups on file.  Sigmund Hazel, CMA 09/28/2023 11:16 AM

## 2023-09-29 ENCOUNTER — Telehealth (HOSPITAL_COMMUNITY): Payer: Self-pay | Admitting: *Deleted

## 2023-09-29 NOTE — Telephone Encounter (Signed)
 PT called today to get no show appt rescheduled  - PT set for 12/01/23 PT did state that she felt as thought medication was not working - Please reach out to PT when time allows      Screened by nursing staff.   When speaking with Pt she is unsure which medication is not working for her symptoms: anxiety and depression. Told Pt will send this up to provider as cannot advise to stoop these medications if they are ineffective. Is she feels like she is experiencing HI/SI to urgently go to down stairs urgent care.

## 2023-10-01 NOTE — Telephone Encounter (Signed)
 Called patient and stated this word from word from Provider"   recommend she continue current medications and we will discuss more at her next appointment? She was started at a low dose of her medications due to historical sensitivity so we likely will need to go up at her next appointment "    JNL, CMA

## 2023-10-23 ENCOUNTER — Ambulatory Visit (HOSPITAL_COMMUNITY): Payer: MEDICAID | Admitting: Mental Health

## 2023-11-03 ENCOUNTER — Other Ambulatory Visit (HOSPITAL_BASED_OUTPATIENT_CLINIC_OR_DEPARTMENT_OTHER): Payer: Self-pay | Admitting: Family Medicine

## 2023-11-03 ENCOUNTER — Encounter (HOSPITAL_BASED_OUTPATIENT_CLINIC_OR_DEPARTMENT_OTHER): Payer: MEDICAID | Admitting: Family Medicine

## 2023-11-03 NOTE — Telephone Encounter (Signed)
 Copied from CRM [id number]. Topic: Clinical - Medication Refill >> Nov 03, 2023 11:17 AM Felizardo Hotter wrote: Medication: pantoprazole  (PROTONIX ) 40 MG tablet  Has the patient contacted their pharmacy? Yes (Agent: If no, request that the patient contact the pharmacy for the refill. If patient does not wish to contact the pharmacy document the reason why and proceed with request.) (Agent: If yes, when and what did the pharmacy advise?)  This is the patient's preferred pharmacy:  [id number] [place], [place] - [address] AT [facility] [address] [place] [address] Phone: [phone] Fax: [phone]  Is this the correct pharmacy for this prescription? Yes If no, delete pharmacy and type the correct one.   Has the prescription been filled recently? Yes  Is the patient out of the medication? Yes  Has the patient been seen for an appointment in the last year OR does the patient have an upcoming appointment? Yes  Can we respond through MyChart? Yes  Agent: Please be advised that Rx refills may take up to 3 business days. We ask that you follow-up with your pharmacy.

## 2023-11-03 NOTE — Telephone Encounter (Signed)
 This RN spoke to pt, advised refills available. Pt verbalized understanding and agrees to plan.
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
## 2023-12-01 ENCOUNTER — Encounter (HOSPITAL_COMMUNITY): Payer: MEDICAID | Admitting: Psychiatry

## 2023-12-10 ENCOUNTER — Encounter (HOSPITAL_BASED_OUTPATIENT_CLINIC_OR_DEPARTMENT_OTHER): Payer: Self-pay | Admitting: Family Medicine

## 2023-12-23 ENCOUNTER — Encounter (HOSPITAL_COMMUNITY): Payer: Self-pay

## 2023-12-23 ENCOUNTER — Ambulatory Visit (HOSPITAL_COMMUNITY): Payer: MEDICAID | Admitting: Mental Health

## 2023-12-29 ENCOUNTER — Telehealth (HOSPITAL_BASED_OUTPATIENT_CLINIC_OR_DEPARTMENT_OTHER): Payer: Self-pay | Admitting: *Deleted

## 2023-12-29 NOTE — Telephone Encounter (Signed)
 Copied from CRM [id number]. Topic: General - Other >> Dec 29, 2023  3:53 PM Tiffany B wrote: Reason for CRM: checking on the status of clinical note request for incontinence supplies faxed on  6/30 and 7/3 faxed to [phone]. Caller will refax again please notate when received.   Please Fax back  [phone]

## 2023-12-30 NOTE — Telephone Encounter (Signed)
 Form received. Fax has been sent to [employer]. Pt has not been seen since 04/24/23. Nothing mentioned during that OV of incontinence. Next OV 01/27/24.
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
## 2024-01-05 ENCOUNTER — Encounter (HOSPITAL_COMMUNITY): Payer: Self-pay

## 2024-01-05 ENCOUNTER — Encounter (HOSPITAL_COMMUNITY): Payer: MEDICAID | Admitting: Psychiatry

## 2024-01-13 ENCOUNTER — Encounter: Payer: MEDICAID | Admitting: Obstetrics and Gynecology

## 2024-01-27 ENCOUNTER — Encounter (HOSPITAL_BASED_OUTPATIENT_CLINIC_OR_DEPARTMENT_OTHER): Payer: MEDICAID | Admitting: Family Medicine

## 2024-02-02 ENCOUNTER — Encounter: Payer: MEDICAID | Admitting: Family Medicine

## 2024-02-20 ENCOUNTER — Emergency Department (HOSPITAL_COMMUNITY)
Admission: EM | Admit: 2024-02-20 | Discharge: 2024-02-21 | Discharge status: Against Medical Advice | Payer: MEDICAID | Attending: Emergency Medicine | Admitting: Emergency Medicine

## 2024-02-20 ENCOUNTER — Encounter (HOSPITAL_COMMUNITY): Payer: Self-pay | Admitting: Radiology

## 2024-02-20 ENCOUNTER — Other Ambulatory Visit (HOSPITAL_BASED_OUTPATIENT_CLINIC_OR_DEPARTMENT_OTHER): Payer: Self-pay | Admitting: Family Medicine

## 2024-02-20 ENCOUNTER — Other Ambulatory Visit: Payer: Self-pay

## 2024-02-20 DIAGNOSIS — M25551 Pain in right hip: Secondary | ICD-10-CM | POA: Insufficient documentation

## 2024-02-20 DIAGNOSIS — Z5321 Procedure and treatment not carried out due to patient leaving prior to being seen by health care provider: Secondary | ICD-10-CM | POA: Diagnosis not present

## 2024-02-20 DIAGNOSIS — M549 Dorsalgia, unspecified: Secondary | ICD-10-CM | POA: Insufficient documentation

## 2024-02-20 DIAGNOSIS — R109 Unspecified abdominal pain: Secondary | ICD-10-CM | POA: Insufficient documentation

## 2024-02-20 DIAGNOSIS — R350 Frequency of micturition: Secondary | ICD-10-CM | POA: Insufficient documentation

## 2024-02-20 LAB — URINALYSIS, ROUTINE W REFLEX MICROSCOPIC
Bacteria, UA: NONE SEEN
Bilirubin Urine: NEGATIVE
Glucose, UA: NEGATIVE mg/dL
Hgb urine dipstick: NEGATIVE
Ketones, ur: NEGATIVE mg/dL
Nitrite: NEGATIVE
Protein, ur: NEGATIVE mg/dL
Specific Gravity, Urine: 1.019 (ref 1.005–1.030)
pH: 5 (ref 5.0–8.0)

## 2024-02-20 LAB — CBC
HCT: 37.5 % (ref 36.0–46.0)
Hemoglobin: 12 g/dL (ref 12.0–15.0)
MCH: 27.2 pg (ref 26.0–34.0)
MCHC: 32 g/dL (ref 30.0–36.0)
MCV: 85 fL (ref 80.0–100.0)
Platelets: 453 K/uL — ABNORMAL HIGH (ref 150–400)
RBC: 4.41 MIL/uL (ref 3.87–5.11)
RDW: 15.1 % (ref 11.5–15.5)
WBC: 18.3 K/uL — ABNORMAL HIGH (ref 4.0–10.5)
nRBC: 0 % (ref 0.0–0.2)

## 2024-02-20 NOTE — ED Triage Notes (Signed)
 One hour ago pt began having right hip and side pain that she describes as sharp. Pain is worse when she walks or moves. Denies doing anything to cause the pain. Was sitting in bed watching TV. She states that her back has been hurting a little for about 2 weeks. She isn't sure what changed tonight. She does endorse more frequent urination.  142/80 86 20 96% RA

## 2024-02-21 LAB — COMPREHENSIVE METABOLIC PANEL WITH GFR
ALT: 25 U/L (ref 0–44)
AST: 22 U/L (ref 15–41)
Albumin: 3.6 g/dL (ref 3.5–5.0)
Alkaline Phosphatase: 67 U/L (ref 38–126)
Anion gap: 8 (ref 5–15)
BUN: 7 mg/dL (ref 6–20)
CO2: 24 mmol/L (ref 22–32)
Calcium: 8.8 mg/dL — ABNORMAL LOW (ref 8.9–10.3)
Chloride: 103 mmol/L (ref 98–111)
Creatinine, Ser: 0.81 mg/dL (ref 0.44–1.00)
GFR, Estimated: >60 mL/min (ref >60)
Glucose, Bld: 134 mg/dL — ABNORMAL HIGH (ref 70–99)
Potassium: 3.7 mmol/L (ref 3.5–5.1)
Sodium: 135 mmol/L (ref 135–145)
Total Bilirubin: 0.5 mg/dL (ref 0.0–1.2)
Total Protein: 7.1 g/dL (ref 6.5–8.1)

## 2024-02-21 LAB — LIPASE, BLOOD: Lipase: 38 U/L (ref 11–51)

## 2024-02-21 NOTE — ED Notes (Signed)
 Pt decided to leave because she said she feels better and does not want to get an IV.

## 2024-02-21 NOTE — ED Provider Triage Note (Signed)
 Emergency Medicine Provider Triage Evaluation Note  Alicia Parks , a 48 y.o. female  was evaluated in triage.  Pt complains of right-sided flank pain.  Reports that she was sitting at home when she developed a pain about her umbilicus that radiates to her right flank.  Reports this pain is now resolved.  She denies history of kidney stones.  States that she was sitting in bed when this began.  She states that she has urinary frequency but reports this is a chronic issue for her.  Denies any new features tonight.  Denies dysuria.  Denies nausea, vomiting or fevers.  Reports pain is all subsided.  States that she has history of cholecystectomy and ever since then, things have not been right.  Excessive diarrhea.    Review of Systems  Positive:  Negative:   Physical Exam  BP 136/78 (BP Location: Left Arm)   Pulse 92   Temp 98.8 F (37.1 C)   Resp 18   Ht 5' 4 (1.626 m)   Wt 103.9 kg   SpO2 99%   BMI 39.31 kg/m  Gen:   Awake, no distress   Resp:  Normal effort  MSK:   Moves extremities without difficulty  Other:    Medical Decision Making  Medically screening exam initiated at 12:14 AM.  Appropriate orders placed.  Alicia Parks was informed that the remainder of the evaluation will be completed by another provider, this initial triage assessment does not replace that evaluation, and the importance of remaining in the ED until their evaluation is complete.     Ruthell Lonni FALCON, PA-C 02/21/24 0015

## 2024-03-02 ENCOUNTER — Encounter: Payer: MEDICAID | Admitting: Obstetrics and Gynecology

## 2024-03-16 ENCOUNTER — Other Ambulatory Visit (HOSPITAL_BASED_OUTPATIENT_CLINIC_OR_DEPARTMENT_OTHER): Payer: Self-pay | Admitting: Family Medicine

## 2024-03-30 ENCOUNTER — Ambulatory Visit (HOSPITAL_BASED_OUTPATIENT_CLINIC_OR_DEPARTMENT_OTHER): Payer: MEDICAID | Admitting: Family Medicine

## 2024-04-05 ENCOUNTER — Ambulatory Visit (HOSPITAL_BASED_OUTPATIENT_CLINIC_OR_DEPARTMENT_OTHER): Payer: MEDICAID | Admitting: Family Medicine

## 2024-04-05 ENCOUNTER — Encounter (HOSPITAL_BASED_OUTPATIENT_CLINIC_OR_DEPARTMENT_OTHER): Payer: Self-pay

## 2024-04-05 ENCOUNTER — Encounter (HOSPITAL_BASED_OUTPATIENT_CLINIC_OR_DEPARTMENT_OTHER): Payer: Self-pay | Admitting: *Deleted

## 2024-04-11 ENCOUNTER — Ambulatory Visit (HOSPITAL_BASED_OUTPATIENT_CLINIC_OR_DEPARTMENT_OTHER): Payer: MEDICAID | Admitting: Family Medicine

## 2024-04-13 ENCOUNTER — Encounter (HOSPITAL_BASED_OUTPATIENT_CLINIC_OR_DEPARTMENT_OTHER): Payer: MEDICAID | Admitting: Family Medicine

## 2024-04-13 ENCOUNTER — Encounter (HOSPITAL_BASED_OUTPATIENT_CLINIC_OR_DEPARTMENT_OTHER): Payer: Self-pay

## 2024-04-27 ENCOUNTER — Ambulatory Visit (HOSPITAL_BASED_OUTPATIENT_CLINIC_OR_DEPARTMENT_OTHER): Payer: MEDICAID | Admitting: Family Medicine

## 2024-05-27 ENCOUNTER — Ambulatory Visit: Payer: MEDICAID | Admitting: Nurse Practitioner

## 2024-05-30 ENCOUNTER — Ambulatory Visit: Payer: MEDICAID | Admitting: Internal Medicine

## 2024-06-03 ENCOUNTER — Ambulatory Visit: Payer: MEDICAID | Admitting: Internal Medicine

## 2024-06-08 ENCOUNTER — Ambulatory Visit: Payer: MEDICAID | Admitting: Internal Medicine

## 2024-06-28 ENCOUNTER — Ambulatory Visit: Payer: MEDICAID | Admitting: Internal Medicine

## 2024-07-22 ENCOUNTER — Ambulatory Visit: Payer: Self-pay

## 2024-07-22 ENCOUNTER — Encounter: Payer: Self-pay | Admitting: Nurse Practitioner

## 2024-07-22 NOTE — Telephone Encounter (Signed)
 FYI Only or Action Required?: FYI only for provider: UC.  Patient was last seen in primary care on 07/03/2023 by [name], NP.  Called Nurse Triage reporting Nasal Congestion.  Symptoms began a week ago.  Interventions attempted: Nothing.  Symptoms are: unchanged.  Triage Disposition: See HCP Within 4 Hours (Or PCP Triage), See Physician Within 24 Hours  Patient/caregiver understands and will follow disposition?: No, wishes to speak with PCP  Message from [name] sent at 07/22/2024 10:24 AM EST  Reason for Triage: patient has green mucus coming up in the mornings     Reason for Disposition  [1] Sinus pain (not just congestion) AND [2] fever  [1] MILD difficulty breathing (e.g., minimal/no SOB at rest, SOB with walking, pulse < 100) AND [2] NEW-onset or WORSE than normal  Answer Assessment - Initial Assessment Questions Patient called to cancel appt today and reports unable to make due to new work orientation; declines appt today for symptoms.  Advised UC today or ED/911 if symptoms occur/worsen: severe diff breathing, chest pain > 5 min, faint. Patient verbalized understanding.   R/s TOC 08/31/24.   1. RESPIRATORY STATUS: Describe your breathing? (e.g., wheezing, shortness of breath, unable to speak, severe coughing)   Last episode 3 weeks ago,   Sob exertion, right side chest pain, last 2 minutes, comes and goes 2. ONSET: When did this breathing problem begin?      3 weeks ago 3. PATTERN Does the difficult breathing come and go, or has it been constant since it started?      Comes and goes 4. SEVERITY: How bad is your breathing? (e.g., mild, moderate, severe)   no 6. CARDIAC HISTORY: Do you have any history of heart disease? (e.g., heart attack, angina, bypass surgery, angioplasty)      no 7. LUNG HISTORY: Do you have any history of lung disease?  (e.g., pulmonary embolus, asthma, emphysema)     no 8. CAUSE: What do you think is causing the breathing  problem?      unsure 9. OTHER SYMPTOMS: Do you have any other symptoms? (e.g., chest pain, cough, dizziness, fever, runny nose) Denies diff breathing, chest pain, dizziness,cough,  Answer Assessment - Initial Assessment Questions 1. LOCATION: Where does it hurt?      Left nostril 2. ONSET: When did the sinus pain start?  (e.g., hours, days)      Month ago, finished abt, still having green drainage 3. SEVERITY: How bad is the pain?   (Scale 0-10; or none, mild, moderate or severe)     No pain 4. RECURRENT SYMPTOM: Have you ever had sinus problems before? If Yes, ask: When was the last time? and What happened that time?      yes 5. NASAL CONGESTION: Is the nose blocked? If Yes, ask: Can you open it or must you breathe through your mouth?     yes 6. NASAL DISCHARGE: Do you have discharge from your nose? If so ask, What color?     green 7. FEVER: Do you have a fever? If Yes, ask: What is it, how was it measured, and when did it start?      denies 8. OTHER SYMPTOMS: Do you have any other symptoms? (e.g., sore throat, cough, earache, difficulty breathing)  denies  Protocols used: Sinus Pain or Congestion-A-AH, Breathing Difficulty-A-AH

## 2024-07-26 ENCOUNTER — Telehealth: Payer: MEDICAID | Admitting: Nurse Practitioner

## 2024-07-26 DIAGNOSIS — S161XXA Strain of muscle, fascia and tendon at neck level, initial encounter: Secondary | ICD-10-CM | POA: Diagnosis not present

## 2024-07-26 DIAGNOSIS — J209 Acute bronchitis, unspecified: Secondary | ICD-10-CM

## 2024-07-26 DIAGNOSIS — J329 Chronic sinusitis, unspecified: Secondary | ICD-10-CM | POA: Diagnosis not present

## 2024-07-26 MED ORDER — DOXYCYCLINE HYCLATE 100 MG PO TABS: 100.0000 mg | ORAL_TABLET | Freq: Two times a day (BID) | ORAL | 0 refills | Status: AC

## 2024-07-26 MED ORDER — CYCLOBENZAPRINE HCL 5 MG PO TABS: 5.0000 mg | ORAL_TABLET | Freq: Three times a day (TID) | ORAL | 0 refills | Status: AC | PRN

## 2024-07-26 MED ORDER — PREDNISONE 20 MG PO TABS: 20.0000 mg | ORAL_TABLET | Freq: Every day | ORAL | 0 refills | Status: AC

## 2024-07-26 NOTE — Patient Instructions (Addendum)
 " Alicia Parks, thank you for joining Alicia [name], NP for today's virtual visit.  While this provider is not your primary care provider (PCP), if your PCP is located in our provider database this encounter information will be shared with them immediately following your visit.   A Alicia Parks account gives you access to today's visit and all your visits, tests, and labs performed at [facility]  click here if you don't have a [name] [facility] account or go to [email or web address]  Consent: (Patient) Alicia Parks provided verbal consent for this virtual visit at the beginning of the encounter.  Current Medications:  Current Outpatient Medications:    cyclobenzaprine  (FLEXERIL ) 5 MG tablet, Take 1 tablet (5 mg total) by mouth 3 (three) times daily as needed for muscle spasms., Disp: 20 tablet, Rfl: 0   doxycycline  (VIBRA -TABS) 100 MG tablet, Take 1 tablet (100 mg total) by mouth 2 (two) times daily for 10 days., Disp: 20 tablet, Rfl: 0   pantoprazole  (PROTONIX ) 40 MG tablet, TAKE 1 TABLET BY MOUTH EVERY DAY, Disp: 90 tablet, Rfl: 1   predniSONE  (DELTASONE ) 20 MG tablet, Take 1 tablet (20 mg total) by mouth daily with breakfast for 5 days., Disp: 5 tablet, Rfl: 0   vitamin B-12 (CYANOCOBALAMIN) 100 MCG tablet, Take 100 mcg by mouth daily., Disp: , Rfl:    Medications ordered in this encounter:  Meds ordered this encounter  Medications   doxycycline  (VIBRA -TABS) 100 MG tablet    Sig: Take 1 tablet (100 mg total) by mouth 2 (two) times daily for 10 days.    Dispense:  20 tablet    Refill:  0   predniSONE  (DELTASONE ) 20 MG tablet    Sig: Take 1 tablet (20 mg total) by mouth daily with breakfast for 5 days.    Dispense:  5 tablet    Refill:  0   cyclobenzaprine  (FLEXERIL ) 5 MG tablet    Sig: Take 1 tablet (5 mg total) by mouth 3 (three) times daily as needed for muscle spasms.    Dispense:  20 tablet    Refill:  0     *If you need refills on other  medications prior to your next appointment, please contact your pharmacy*  Follow-Up: Call back or seek an in-person evaluation if the symptoms worsen or if the condition fails to improve as anticipated.   Other Instructions With your unresolved sinus infection symptoms, I have changed antibiotic classes to treat you with a 10 day course of doxycycline .  I also recommend taking Mucinex  D, if you do not have glaucoma or high blood pressure, or plain Mucinex  over the counter. Saline nasal irrigations/rinses 1-2 days are also beneficial to help treat the sinus symptoms; use bottled distilled water with these.  Please take your antibiotic in it's entirety, and do not stop it just because you are feeling better. Take it with food.  Home Care: Only take medications as instructed by your medical team. Complete the entire course of an antibiotic. Do not take these medications with alcohol. A steam or ultrasonic humidifier can help congestion.  You can place a towel over your head and breathe in the steam from hot water coming from a faucet. Avoid close contacts especially the very young and the elderly. Cover your mouth when you cough or sneeze. Always remember to wash your hands.  Get Help Right Away If: You develop worsening fever or sinus pain. You develop a severe head ache or visual  changes. Your symptoms persist after you have completed your treatment plan.  I am also putting you on prednisone , which will help with the inflammation in your sinuses and your neck/upper back. Take in the morning with food. Do not take the Aleve  and Ibuprofen  while on the prednisone . You can continue to take over the counter acetaminophen  (Tylenol ) as needed.  I have sent in flexeril , which is a muscle relaxer, that you can use to help with the neck/back pain as well. This may cause drowsiness. Do not drive or operate heavy machinery after taking. If symptoms fail to improve, you need to be seen in person. If  you develop weakness or numbness, go to the emergency department.     If you have been instructed to have an in-person evaluation today at a local Urgent Care facility, please use the link below. It will take you to a list of all of our available [facility], including address, phone number and hours of operation. Please do not delay care.  [facility]  If you or a family member do not have a primary care provider, use the link below to schedule a visit and establish care. When you choose a [facility] primary care physician or advanced practice provider, you gain a long-term partner in health. Find a Primary Care Provider  Learn more about [facility] in-office and virtual care options:  - Get Care Now  "

## 2024-08-31 ENCOUNTER — Encounter: Payer: Self-pay | Admitting: Family
